# Patient Record
Sex: Female | Born: 1955 | Race: White | Hispanic: No | Marital: Married | Smoking: Former smoker
Health system: Southern US, Community
[De-identification: ages and names within clinical notes are randomized; demographics above are authoritative.]

## PROBLEM LIST (undated history)

## (undated) DIAGNOSIS — K219 Gastro-esophageal reflux disease without esophagitis: Secondary | ICD-10-CM

## (undated) DIAGNOSIS — R0602 Shortness of breath: Secondary | ICD-10-CM

## (undated) DIAGNOSIS — R112 Nausea with vomiting, unspecified: Secondary | ICD-10-CM

## (undated) DIAGNOSIS — E785 Hyperlipidemia, unspecified: Secondary | ICD-10-CM

## (undated) DIAGNOSIS — I350 Nonrheumatic aortic (valve) stenosis: Secondary | ICD-10-CM

## (undated) DIAGNOSIS — R002 Palpitations: Secondary | ICD-10-CM

## (undated) DIAGNOSIS — Z90710 Acquired absence of both cervix and uterus: Secondary | ICD-10-CM

## (undated) DIAGNOSIS — I209 Angina pectoris, unspecified: Secondary | ICD-10-CM

## (undated) DIAGNOSIS — R51 Headache: Secondary | ICD-10-CM

## (undated) DIAGNOSIS — E042 Nontoxic multinodular goiter: Secondary | ICD-10-CM

## (undated) DIAGNOSIS — Z9889 Other specified postprocedural states: Secondary | ICD-10-CM

## (undated) DIAGNOSIS — R011 Cardiac murmur, unspecified: Secondary | ICD-10-CM

## (undated) HISTORY — PX: CARDIAC VALVE REPLACEMENT: SHX585

## (undated) HISTORY — DX: Gastro-esophageal reflux disease without esophagitis: K21.9

## (undated) HISTORY — DX: Palpitations: R00.2

## (undated) HISTORY — DX: Other specified postprocedural states: Z98.890

## (undated) HISTORY — PX: BREAST CYST ASPIRATION: SHX578

## (undated) HISTORY — PX: ABDOMINAL HYSTERECTOMY: SHX81

## (undated) HISTORY — DX: Nonrheumatic aortic (valve) stenosis: I35.0

## (undated) HISTORY — DX: Acquired absence of both cervix and uterus: Z90.710

## (undated) HISTORY — DX: Cardiac murmur, unspecified: R01.1

## (undated) HISTORY — DX: Hyperlipidemia, unspecified: E78.5

## (undated) HISTORY — PX: PARTIAL HYSTERECTOMY: SHX80

## (undated) HISTORY — DX: Nontoxic multinodular goiter: E04.2

## (undated) HISTORY — PX: BUNIONECTOMY: SHX129

## (undated) HISTORY — DX: Other specified postprocedural states: R11.2

---

## 1999-11-20 ENCOUNTER — Other Ambulatory Visit: Admission: RE | Admit: 1999-11-20 | Discharge: 1999-11-20 | Payer: Self-pay | Admitting: *Deleted

## 2000-06-25 ENCOUNTER — Encounter: Payer: Self-pay | Admitting: Gastroenterology

## 2000-06-25 ENCOUNTER — Encounter: Admission: RE | Admit: 2000-06-25 | Discharge: 2000-06-25 | Payer: Self-pay | Admitting: Gastroenterology

## 2000-07-08 ENCOUNTER — Ambulatory Visit (HOSPITAL_COMMUNITY): Admission: RE | Admit: 2000-07-08 | Discharge: 2000-07-08 | Payer: Self-pay | Admitting: Gastroenterology

## 2000-07-08 ENCOUNTER — Encounter: Payer: Self-pay | Admitting: Gastroenterology

## 2000-10-21 ENCOUNTER — Encounter: Admission: RE | Admit: 2000-10-21 | Discharge: 2000-10-21 | Payer: Self-pay | Admitting: Obstetrics and Gynecology

## 2000-10-21 ENCOUNTER — Encounter: Payer: Self-pay | Admitting: Obstetrics and Gynecology

## 2000-11-21 ENCOUNTER — Other Ambulatory Visit: Admission: RE | Admit: 2000-11-21 | Discharge: 2000-11-21 | Payer: Self-pay | Admitting: *Deleted

## 2001-11-23 ENCOUNTER — Other Ambulatory Visit: Admission: RE | Admit: 2001-11-23 | Discharge: 2001-11-23 | Payer: Self-pay | Admitting: Obstetrics and Gynecology

## 2002-12-13 ENCOUNTER — Encounter: Payer: Self-pay | Admitting: Obstetrics and Gynecology

## 2002-12-13 ENCOUNTER — Encounter: Admission: RE | Admit: 2002-12-13 | Discharge: 2002-12-13 | Payer: Self-pay | Admitting: Obstetrics and Gynecology

## 2003-04-05 ENCOUNTER — Encounter: Payer: Self-pay | Admitting: Family Medicine

## 2003-04-05 ENCOUNTER — Encounter: Admission: RE | Admit: 2003-04-05 | Discharge: 2003-04-05 | Payer: Self-pay | Admitting: Family Medicine

## 2003-04-06 ENCOUNTER — Encounter: Payer: Self-pay | Admitting: Family Medicine

## 2003-04-06 ENCOUNTER — Encounter: Admission: RE | Admit: 2003-04-06 | Discharge: 2003-04-06 | Payer: Self-pay | Admitting: Family Medicine

## 2004-04-12 ENCOUNTER — Encounter: Admission: RE | Admit: 2004-04-12 | Discharge: 2004-04-12 | Payer: Self-pay | Admitting: *Deleted

## 2004-09-04 ENCOUNTER — Other Ambulatory Visit: Admission: RE | Admit: 2004-09-04 | Discharge: 2004-09-04 | Payer: Self-pay | Admitting: *Deleted

## 2004-09-10 ENCOUNTER — Encounter: Admission: RE | Admit: 2004-09-10 | Discharge: 2004-09-10 | Payer: Self-pay | Admitting: Family Medicine

## 2005-08-12 HISTORY — PX: BREAST CYST ASPIRATION: SHX578

## 2006-01-28 ENCOUNTER — Encounter: Admission: RE | Admit: 2006-01-28 | Discharge: 2006-01-28 | Payer: Self-pay | Admitting: *Deleted

## 2006-02-10 ENCOUNTER — Encounter: Admission: RE | Admit: 2006-02-10 | Discharge: 2006-02-10 | Payer: Self-pay | Admitting: *Deleted

## 2006-11-13 ENCOUNTER — Ambulatory Visit: Payer: Self-pay | Admitting: Internal Medicine

## 2006-11-13 LAB — CONVERTED CEMR LAB
ALT: 12 units/L (ref 0–40)
Albumin: 3.8 g/dL (ref 3.5–5.2)
Alkaline Phosphatase: 70 units/L (ref 39–117)
Bilirubin, Direct: 0.1 mg/dL (ref 0.0–0.3)
Calcium: 9 mg/dL (ref 8.4–10.5)
Chloride: 106 meq/L (ref 96–112)
Cholesterol: 231 mg/dL (ref 0–200)
Creatinine, Ser: 0.8 mg/dL (ref 0.4–1.2)
Direct LDL: 146.2 mg/dL
Eosinophils Absolute: 0.1 10*3/uL (ref 0.0–0.6)
Eosinophils Relative: 2.4 % (ref 0.0–5.0)
GFR calc Af Amer: 98 mL/min
GFR calc non Af Amer: 81 mL/min
Glucose, Bld: 98 mg/dL (ref 70–99)
HCT: 39.9 % (ref 36.0–46.0)
Ketones, ur: NEGATIVE mg/dL
MCV: 89.4 fL (ref 78.0–100.0)
Neutro Abs: 2.9 10*3/uL (ref 1.4–7.7)
Neutrophils Relative %: 47.2 % (ref 43.0–77.0)
Nitrite: NEGATIVE
Platelets: 276 10*3/uL (ref 150–400)
Potassium: 4.3 meq/L (ref 3.5–5.1)
RBC: 4.46 M/uL (ref 3.87–5.11)
RDW: 12 % (ref 11.5–14.6)
Sodium: 141 meq/L (ref 135–145)
Specific Gravity, Urine: 1.02 (ref 1.000–1.03)
WBC: 6.2 10*3/uL (ref 4.5–10.5)
pH: 6.5 (ref 5.0–8.0)

## 2006-11-20 ENCOUNTER — Ambulatory Visit: Payer: Self-pay | Admitting: Internal Medicine

## 2006-11-25 ENCOUNTER — Encounter: Admission: RE | Admit: 2006-11-25 | Discharge: 2006-11-25 | Payer: Self-pay | Admitting: Internal Medicine

## 2006-12-03 ENCOUNTER — Ambulatory Visit: Payer: Self-pay | Admitting: Cardiovascular Disease

## 2006-12-16 ENCOUNTER — Ambulatory Visit: Payer: Self-pay | Admitting: Cardiovascular Disease

## 2006-12-16 ENCOUNTER — Encounter: Payer: Self-pay | Admitting: Internal Medicine

## 2006-12-16 ENCOUNTER — Ambulatory Visit: Payer: Self-pay

## 2007-01-01 ENCOUNTER — Ambulatory Visit: Payer: Self-pay | Admitting: Internal Medicine

## 2007-02-02 ENCOUNTER — Encounter: Payer: Self-pay | Admitting: Internal Medicine

## 2007-02-02 LAB — CONVERTED CEMR LAB

## 2007-02-16 ENCOUNTER — Ambulatory Visit: Payer: Self-pay | Admitting: Cardiovascular Disease

## 2007-03-04 ENCOUNTER — Encounter: Admission: RE | Admit: 2007-03-04 | Discharge: 2007-03-04 | Payer: Self-pay | Admitting: *Deleted

## 2007-06-03 ENCOUNTER — Ambulatory Visit: Payer: Self-pay | Admitting: Internal Medicine

## 2007-06-08 ENCOUNTER — Telehealth: Payer: Self-pay | Admitting: Internal Medicine

## 2007-06-22 ENCOUNTER — Ambulatory Visit: Payer: Self-pay | Admitting: Internal Medicine

## 2007-06-22 DIAGNOSIS — E785 Hyperlipidemia, unspecified: Secondary | ICD-10-CM | POA: Insufficient documentation

## 2007-06-22 DIAGNOSIS — E042 Nontoxic multinodular goiter: Secondary | ICD-10-CM | POA: Insufficient documentation

## 2007-06-22 DIAGNOSIS — G43009 Migraine without aura, not intractable, without status migrainosus: Secondary | ICD-10-CM | POA: Insufficient documentation

## 2007-06-25 ENCOUNTER — Telehealth (INDEPENDENT_AMBULATORY_CARE_PROVIDER_SITE_OTHER): Payer: Self-pay | Admitting: *Deleted

## 2007-09-09 ENCOUNTER — Encounter: Payer: Self-pay | Admitting: Internal Medicine

## 2007-09-10 ENCOUNTER — Ambulatory Visit: Payer: Self-pay | Admitting: Internal Medicine

## 2007-09-10 DIAGNOSIS — R059 Cough, unspecified: Secondary | ICD-10-CM | POA: Insufficient documentation

## 2007-09-10 DIAGNOSIS — R05 Cough: Secondary | ICD-10-CM

## 2007-09-18 ENCOUNTER — Ambulatory Visit: Payer: Self-pay | Admitting: Internal Medicine

## 2007-09-18 LAB — CONVERTED CEMR LAB
Cholesterol: 165 mg/dL (ref 0–200)
HDL: 38.6 mg/dL — ABNORMAL LOW (ref >39.0)
VLDL: 36 mg/dL (ref 0–40)

## 2007-09-21 ENCOUNTER — Encounter: Payer: Self-pay | Admitting: Internal Medicine

## 2007-11-07 ENCOUNTER — Encounter: Payer: Self-pay | Admitting: Internal Medicine

## 2007-11-07 ENCOUNTER — Ambulatory Visit: Payer: Self-pay | Admitting: Family Medicine

## 2007-11-10 ENCOUNTER — Telehealth: Payer: Self-pay | Admitting: Internal Medicine

## 2007-11-30 ENCOUNTER — Ambulatory Visit: Payer: Self-pay | Admitting: Internal Medicine

## 2007-11-30 DIAGNOSIS — R079 Chest pain, unspecified: Secondary | ICD-10-CM | POA: Insufficient documentation

## 2007-12-02 ENCOUNTER — Encounter (INDEPENDENT_AMBULATORY_CARE_PROVIDER_SITE_OTHER): Payer: Self-pay | Admitting: *Deleted

## 2007-12-16 ENCOUNTER — Encounter: Payer: Self-pay | Admitting: Internal Medicine

## 2007-12-16 ENCOUNTER — Ambulatory Visit: Payer: Self-pay

## 2007-12-28 ENCOUNTER — Telehealth: Payer: Self-pay | Admitting: Internal Medicine

## 2008-02-23 ENCOUNTER — Ambulatory Visit: Payer: Self-pay | Admitting: Internal Medicine

## 2008-02-23 LAB — CONVERTED CEMR LAB
HDL: 43.3 mg/dL (ref >39.0)
Total CHOL/HDL Ratio: 4
Triglycerides: 196 mg/dL — ABNORMAL HIGH (ref 0–149)

## 2008-03-01 ENCOUNTER — Ambulatory Visit: Payer: Self-pay | Admitting: Internal Medicine

## 2008-03-01 DIAGNOSIS — F411 Generalized anxiety disorder: Secondary | ICD-10-CM | POA: Insufficient documentation

## 2008-03-01 DIAGNOSIS — K219 Gastro-esophageal reflux disease without esophagitis: Secondary | ICD-10-CM | POA: Insufficient documentation

## 2008-03-16 ENCOUNTER — Encounter: Admission: RE | Admit: 2008-03-16 | Discharge: 2008-03-16 | Payer: Self-pay | Admitting: *Deleted

## 2008-07-26 ENCOUNTER — Ambulatory Visit: Payer: Self-pay | Admitting: Internal Medicine

## 2009-01-23 ENCOUNTER — Telehealth: Payer: Self-pay | Admitting: Internal Medicine

## 2009-02-10 ENCOUNTER — Ambulatory Visit: Payer: Self-pay | Admitting: Internal Medicine

## 2009-02-10 LAB — CONVERTED CEMR LAB
AST: 21 units/L (ref 0–37)
Albumin: 3.8 g/dL (ref 3.5–5.2)
Alkaline Phosphatase: 76 units/L (ref 39–117)
BUN: 13 mg/dL (ref 6–23)
Bilirubin Urine: NEGATIVE
Calcium: 8.9 mg/dL (ref 8.4–10.5)
Chloride: 104 meq/L (ref 96–112)
Eosinophils Relative: 1.8 % (ref 0.0–5.0)
Glucose, Bld: 87 mg/dL (ref 70–99)
HCT: 39.7 % (ref 36.0–46.0)
HDL: 44.5 mg/dL (ref >39.00)
Hemoglobin, Urine: NEGATIVE
Hemoglobin: 13.6 g/dL (ref 12.0–15.0)
Ketones, ur: NEGATIVE mg/dL
Leukocytes, UA: NEGATIVE
Lymphocytes Relative: 32.4 % (ref 12.0–46.0)
Lymphs Abs: 2.4 10*3/uL (ref 0.7–4.0)
Monocytes Absolute: 0.7 10*3/uL (ref 0.1–1.0)
Neutro Abs: 4 10*3/uL (ref 1.4–7.7)
RDW: 12.6 % (ref 11.5–14.6)
Sodium: 143 meq/L (ref 135–145)
Specific Gravity, Urine: 1.02 (ref 1.000–1.030)
TSH: 1.77 microintl units/mL (ref 0.35–5.50)
Total CHOL/HDL Ratio: 5
Total Protein, Urine: NEGATIVE mg/dL
Total Protein: 7 g/dL (ref 6.0–8.3)
VLDL: 30.8 mg/dL (ref 0.0–40.0)

## 2009-02-16 ENCOUNTER — Ambulatory Visit: Payer: Self-pay | Admitting: Internal Medicine

## 2009-02-21 ENCOUNTER — Telehealth: Payer: Self-pay | Admitting: Internal Medicine

## 2009-03-16 ENCOUNTER — Ambulatory Visit: Payer: Self-pay | Admitting: Internal Medicine

## 2009-03-16 LAB — CONVERTED CEMR LAB
ALT: 12 units/L (ref 0–35)
AST: 21 units/L (ref 0–37)
Bilirubin, Direct: 0.2 mg/dL (ref 0.0–0.3)
Cholesterol: 239 mg/dL — ABNORMAL HIGH (ref 0–200)
Direct LDL: 153.7 mg/dL
Total Bilirubin: 0.7 mg/dL (ref 0.3–1.2)
Total Protein: 7.1 g/dL (ref 6.0–8.3)
Triglycerides: 156 mg/dL — ABNORMAL HIGH (ref 0.0–149.0)
VLDL: 31.2 mg/dL (ref 0.0–40.0)

## 2009-03-22 ENCOUNTER — Telehealth: Payer: Self-pay | Admitting: Internal Medicine

## 2009-04-04 ENCOUNTER — Encounter: Admission: RE | Admit: 2009-04-04 | Discharge: 2009-04-04 | Payer: Self-pay | Admitting: Obstetrics and Gynecology

## 2009-06-07 ENCOUNTER — Ambulatory Visit: Payer: Self-pay | Admitting: Internal Medicine

## 2009-06-07 DIAGNOSIS — M2550 Pain in unspecified joint: Secondary | ICD-10-CM | POA: Insufficient documentation

## 2009-06-22 ENCOUNTER — Encounter: Payer: Self-pay | Admitting: Internal Medicine

## 2009-07-12 ENCOUNTER — Ambulatory Visit: Payer: Self-pay | Admitting: Internal Medicine

## 2009-07-12 LAB — CONVERTED CEMR LAB
AST: 28 units/L (ref 0–37)
Total Protein: 6.8 g/dL (ref 6.0–8.3)
VLDL: 37.4 mg/dL (ref 0.0–40.0)

## 2009-07-19 ENCOUNTER — Ambulatory Visit: Payer: Self-pay | Admitting: Internal Medicine

## 2009-07-19 DIAGNOSIS — M1990 Unspecified osteoarthritis, unspecified site: Secondary | ICD-10-CM | POA: Insufficient documentation

## 2009-08-16 ENCOUNTER — Ambulatory Visit: Payer: Self-pay | Admitting: Internal Medicine

## 2009-08-16 LAB — CONVERTED CEMR LAB
Alkaline Phosphatase: 67 units/L (ref 39–117)
Bilirubin, Direct: 0.1 mg/dL (ref 0.0–0.3)
Cholesterol: 199 mg/dL (ref 0–200)
HDL: 59.2 mg/dL (ref >39.00)
Total CHOL/HDL Ratio: 3
Total Protein: 7.3 g/dL (ref 6.0–8.3)
VLDL: 40.2 mg/dL — ABNORMAL HIGH (ref 0.0–40.0)

## 2009-10-23 ENCOUNTER — Telehealth: Payer: Self-pay | Admitting: Internal Medicine

## 2009-11-22 ENCOUNTER — Ambulatory Visit: Payer: Self-pay | Admitting: Internal Medicine

## 2009-11-22 DIAGNOSIS — R011 Cardiac murmur, unspecified: Secondary | ICD-10-CM | POA: Insufficient documentation

## 2009-11-22 DIAGNOSIS — R5383 Other fatigue: Secondary | ICD-10-CM | POA: Insufficient documentation

## 2009-11-22 DIAGNOSIS — R5381 Other malaise: Secondary | ICD-10-CM | POA: Insufficient documentation

## 2009-11-23 ENCOUNTER — Telehealth (INDEPENDENT_AMBULATORY_CARE_PROVIDER_SITE_OTHER): Payer: Self-pay | Admitting: *Deleted

## 2009-11-23 LAB — CONVERTED CEMR LAB
Alkaline Phosphatase: 79 units/L (ref 39–117)
Bilirubin, Direct: 0 mg/dL (ref 0.0–0.3)
CO2: 31 meq/L (ref 19–32)
Calcium: 9.4 mg/dL (ref 8.4–10.5)
Chloride: 102 meq/L (ref 96–112)
Creatinine, Ser: 0.7 mg/dL (ref 0.4–1.2)
Eosinophils Relative: 1.6 % (ref 0.0–5.0)
Folate: 14.4 ng/mL
Glucose, Bld: 92 mg/dL (ref 70–99)
Hemoglobin: 13.8 g/dL (ref 12.0–15.0)
Lymphs Abs: 3.1 10*3/uL (ref 0.7–4.0)
MCHC: 34 g/dL (ref 30.0–36.0)
MCV: 89.8 fL (ref 78.0–100.0)
Monocytes Absolute: 0.8 10*3/uL (ref 0.1–1.0)
Platelets: 288 10*3/uL (ref 150.0–400.0)
Potassium: 4.1 meq/L (ref 3.5–5.1)
RBC: 4.5 M/uL (ref 3.87–5.11)
Sodium: 141 meq/L (ref 135–145)
TSH: 1.47 microintl units/mL (ref 0.35–5.50)
Total Protein: 7.6 g/dL (ref 6.0–8.3)
WBC: 8.9 10*3/uL (ref 4.5–10.5)

## 2010-04-05 ENCOUNTER — Encounter: Admission: RE | Admit: 2010-04-05 | Discharge: 2010-04-05 | Payer: Self-pay | Admitting: Obstetrics and Gynecology

## 2010-04-09 ENCOUNTER — Telehealth: Payer: Self-pay | Admitting: Cardiovascular Disease

## 2010-04-18 ENCOUNTER — Ambulatory Visit (HOSPITAL_COMMUNITY)
Admission: RE | Admit: 2010-04-18 | Discharge: 2010-04-18 | Payer: Self-pay | Source: Home / Self Care | Admitting: Cardiovascular Disease

## 2010-04-18 ENCOUNTER — Encounter: Payer: Self-pay | Admitting: Cardiovascular Disease

## 2010-04-18 ENCOUNTER — Ambulatory Visit: Payer: Self-pay | Admitting: Cardiovascular Disease

## 2010-04-18 ENCOUNTER — Ambulatory Visit: Payer: Self-pay

## 2010-04-23 ENCOUNTER — Encounter: Payer: Self-pay | Admitting: Cardiovascular Disease

## 2010-05-07 ENCOUNTER — Ambulatory Visit: Payer: Self-pay | Admitting: Cardiovascular Disease

## 2010-05-07 DIAGNOSIS — I359 Nonrheumatic aortic valve disorder, unspecified: Secondary | ICD-10-CM | POA: Insufficient documentation

## 2010-05-11 ENCOUNTER — Ambulatory Visit (HOSPITAL_COMMUNITY): Admission: RE | Admit: 2010-05-11 | Discharge: 2010-05-11 | Payer: Self-pay | Admitting: Internal Medicine

## 2010-05-11 ENCOUNTER — Ambulatory Visit: Payer: Self-pay | Admitting: Internal Medicine

## 2010-05-11 ENCOUNTER — Encounter: Payer: Self-pay | Admitting: Internal Medicine

## 2010-05-17 ENCOUNTER — Ambulatory Visit: Payer: Self-pay | Admitting: Cardiovascular Disease

## 2010-06-04 ENCOUNTER — Telehealth: Payer: Self-pay | Admitting: Internal Medicine

## 2010-08-29 ENCOUNTER — Ambulatory Visit
Admission: RE | Admit: 2010-08-29 | Discharge: 2010-08-29 | Payer: Self-pay | Source: Home / Self Care | Attending: Internal Medicine | Admitting: Internal Medicine

## 2010-08-29 DIAGNOSIS — J019 Acute sinusitis, unspecified: Secondary | ICD-10-CM | POA: Insufficient documentation

## 2010-09-01 ENCOUNTER — Encounter: Payer: Self-pay | Admitting: *Deleted

## 2010-09-11 NOTE — Progress Notes (Signed)
Summary: Tick bite  Phone Note Call from Patient Call back at Work Phone [phone]   Caller: Patient Summary of Call: pt called stating that she had a tick in her head. Husband was able to remove lower half of tick. pt did got to UC were she was Rx'd Doxy but  the upper half of the tick was not removed and pt is concerned that with the head of the tick still attached she may get sick even with the ABX. Pt said that there was a bullseye type ring around sore which has improved slightly but she still has HA and soreness at the site. please advise. Initial call taken by: Margaret Pyle, CMA,  October 23, 2009 10:44 AM  Follow-up for Phone Call        OK to cont for now,  there can be swelling and hardness at the site that make it seem like head is still there;  ok to cont the doxy as prescribed as this is the best treatment Follow-up by: Corwin Levins MD,  October 23, 2009 12:53 PM  Additional Follow-up for Phone Call Additional follow up Details #1::        pt imformed and will call back if new sys develop Additional Follow-up by: Margaret Pyle, CMA,  October 23, 2009 1:44 PM

## 2010-09-11 NOTE — Letter (Signed)
Summary: TEE Instructions  [facility], Main Office  [address]   [place], [place] [address]   Phone: [phone]  Fax: [phone]      TEE Instructions  05/07/2010 MRN: [id number]  [name] [address] [place], [place]  [address]      You are scheduled for a TEE on  Friday May 11, 2010 with Dr. Tenny Craw.  Please arrive at the [facility] at 12:00 Noon on the day of your procedure.  1)   Diet:     May have clear liquid breakfast. Then nothing to drink after 7:00 am.  Clear      liquids include:  water, broth, Sprite, Ginger Ale, black cofee, tea (no sugar),      cranberry / grape / apple juice, jello (not red), popsicle from clear juices (not      red).  2)  Must have a responsible person to drive you home.  3)   Bring your current insurance cards and current list of all your medications.   *Special Note:  Every effort is made to have your procedure done on time.  Occasionally there are emergencies that present themselves at the hospital that may cause delays.  Please be patient if a delay does occur.  *If you have any questions after you get home, please call the office at [phone].

## 2010-09-11 NOTE — Progress Notes (Signed)
----   Converted from flag ---- ---- 11/23/2009 11:32 AM, Edman Circle wrote: appt 5/3 @ 1:00  ---- 11/23/2009 9:41 AM, Dagoberto Reef wrote: Asher Muir, pt need echo.   Thanks ------------------------------

## 2010-09-11 NOTE — Assessment & Plan Note (Signed)
Summary: FATIGUE/ NOT FEELING WELL/NWS   Vital Signs:  Patient profile:   55 year old female Height:      62 inches Weight:      171 pounds BMI:     31.39 O2 Sat:      96 % on Room air Temp:     98.5 degrees F oral Pulse rate:   88 / minute BP sitting:   104 / 70  (left arm) Cuff size:   regular  Vitals Entered ByZella Ball Ewing (November 22, 2009 3:33 PM)  O2 Flow:  Room air CC: fatigue, headaches/RE   Primary Care Provider:  yoo  CC:  fatigue and headaches/RE.  History of Present Illness: here with c/o tick bite to the left occipital area mar 15 with mild to mod redness, tender and swelling that has since improved,  still a small bump in the area and is convinced the head is still under the skin;  here today due to very unusual marked fatigue for one wk with headache, ? fever, non-restorative sleep and daytime hypersomnia.  Has lost 3 lbs since last visit, does snore at night, but no report of change in breathing per husband.  Pt denies other new neuro symptoms such as other headache, facial or extremity weakness .  No other rash, joint pains, joint swelling and Pt denies CP, sob, doe, wheezing, orthopnea, pnd, worsening LE edema, palps, dizziness or syncope   No unusual wt loss, other fever, rash , abd pain, bowel or bladder changes, or other constitutional symptoms.    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 2po Qd For 1 Day, Then 1po Qd For 4days, Then Stop 2)  Crestor 20 Mg Tabs (Rosuvastatin Calcium) .Marland Kitchen.. 1 By Mouth Once Daily  Current Medications (verified): 1)  Doxycycline Hyclate 100 Mg Caps (Doxycycline Hyclate) .Marland Kitchen.. 1 By Mouth Two Times A Day 2)  Crestor 20 Mg Tabs (Rosuvastatin Calcium) .Marland Kitchen.. 1 By Mouth Once Daily  Allergies (verified): 1)  ! Codeine 2)  Lipitor (Atorvastatin) 3)  * Niaspan  Past History:  Past Medical History: Last updated: 07/19/2009 Hyperlipidemia multinodular thyroid migraine Aortic sclerosis GERD Anxiety generalized DJD  Past Surgical History: Last updated: 06/22/2007 Hysterectomy  Social History: Last updated: 03/01/2008 Former Smoker Alcohol use-no Married - one daughter 62 y/o Occupation: Employed as Production manager for [employer]  Risk Factors: Smoking Status: quit (06/22/2007)  Review of Systems       all otherwise negative per pt -    Physical Exam  General:  alert and overweight-appearing.   Head:  normocephalic and atraumatic.   Eyes:  vision grossly intact, pupils equal, and pupils round.   Ears:  R ear normal and L ear normal.   Nose:  no external deformity and no nasal discharge.   Mouth:  no gingival abnormalities and  pharynx pink and moist.   Neck:  supple and no masses.   Lungs:  normal respiratory effort and normal breath sounds.   Heart:  RRR, with gr 1-2/6 s murmur at the RUSB Abdomen:  soft, non-tender, and normal bowel sounds.   Msk:  no joint tenderness and no joint swelling.   Extremities:  no edema, no erythema  Neurologic:  cranial nerves II-XII intact and strength normal in all extremities.   Skin:  no rash or sweling to the left occipital area Psych:  normally interactive and moderately anxious.     Impression & Recommendations:  Problem # 1:  BITE OF NONVENOMOUS ARTHROPOD (ICD-E906.4) to check lyme dz titer per pt request;   tx with doxy course, f/u any worsening s/s Orders: T-Lyme Disease ([address])  Problem # 2:  FATIGUE (ICD-780.79) exam benign, to check labs below; follow with expectant management  Orders: TLB-BMP (Basic Metabolic Panel-BMET) (80048-METABOL) TLB-Hepatic/Liver Function Pnl (80076-HEPATIC) TLB-CBC Platelet - w/Differential (85025-CBCD) TLB-TSH (Thyroid Stimulating Hormone) (84443-TSH) TLB-B12 + Folate Pnl (09811_91478-G95/AOZ) TLB-Sedimentation Rate (ESR) (85652-ESR)  Problem # 3:  CARDIAC MURMUR (ICD-785.2) seems likely incidental finding - to check echo to r/o cardiac problem with her unusual fatigue, declines ecg/cxr but will consider further eval if symptoms persist ro worsen, to consider card referral Orders: Echo Referral (Echo)  Complete Medication List: 1)  Doxycycline Hyclate 100 Mg Caps (Doxycycline hyclate) .Marland Kitchen.. 1 by mouth two times a day 2)  Crestor 20 Mg Tabs (Rosuvastatin calcium) .Marland Kitchen.. 1 by mouth once daily  Patient Instructions: 1)  Please take all new medications as prescribed 2)  Continue all previous medications as before this visit  3)  Please go to the Lab in the basement for your blood and/or urine tests today 4)  You will be contacted about the referral(s) to: echocardiogram for the heart 5)  Please schedule a follow-up appointment in 6 months with CPX labs Prescriptions: DOXYCYCLINE HYCLATE 100 MG CAPS (DOXYCYCLINE HYCLATE) 1 by mouth two times a day  #20 x 0   Entered and Authorized by:   Corwin Levins MD   Signed by:   Corwin Levins MD on 11/22/2009   Method used:   Print then Give to Patient   RxID:   [id number]

## 2010-09-11 NOTE — Progress Notes (Signed)
  Phone Note Refill Request Message from:  Fax from Pharmacy on June 04, 2010 11:57 AM  Refills Requested: Medication #1:  CRESTOR 20 MG TABS 1 by mouth once daily   Dosage confirmed as above?Dosage Confirmed   Last Refilled: 07/19/2009   Notes: [facility] Initial call taken by: Robin Ewing CMA Duncan Dull),  June 04, 2010 11:57 AM    Prescriptions: CRESTOR 20 MG TABS (ROSUVASTATIN CALCIUM) 1 by mouth once daily  #90 x 2   Entered by:   Scharlene Gloss CMA (AAMA)   Authorized by:   Corwin Levins MD   Signed by:   Scharlene Gloss CMA (AAMA) on 06/04/2010   Method used:   Faxed to ...       [facility] education [facility])       [address] [address]       [place], [place]  [address]       Ph: [phone]       Fax: [phone]   RxID:   [id number]

## 2010-09-11 NOTE — Assessment & Plan Note (Signed)
Summary: f/u echo   Visit Type:  follow-up last seen 2008 Primary Provider:  Artist Pais  CC:  Some chest pressure- Dizziness after treadmill .  History of Present Illness: 55 year-old woman presenting for followup evaluation after recent evaluation for a heart murmur. She was seen here in 2008 and thought to have aortic sclerosis at that time. Her murmur has increased in intensity and she was referred for a repeat echo demonstrating moderate aortic stenosis. The aortic valve was not well-seen on the 2D echo study.  The patient complains of lightheadedness after she exercises, but denies symptoms concurrent with exertion. She denies exertional chest pain or lightheadedness. She does have chronic dyspnea, unchanged over recent months. She also complains of palpitations. Denies edema.  Current Medications (verified): 1)  Crestor 20 Mg Tabs (Rosuvastatin Calcium) .Marland Kitchen.. 1 By Mouth Once Daily 2)  Fish Oil Triple Strength 1400 Mg Caps (Omega-3 Fatty Acids) .... 900mg  Daily 3)  Vitamin B-12 1000 Mcg Tabs (Cyanocobalamin) .... Take 1 Tablet By Mouth Once A Day 4)  Policosanol 10 Mg Caps (Policosanol) .... Take 1 Capsule By Mouth Once A Day 5)  Evening Primrose Oil 500 Mg Caps (Evening Primrose Oil) .... Take 1 Capsule By Mouth Once A Day 6)  Meno Herbs W/red Clover Natural Plant Estrogens .... Once Daily  Allergies: 1)  ! Codeine 2)  Lipitor (Atorvastatin) 3)  * Niaspan  Past History:  Past medical history reviewed for relevance to current acute and chronic problems.  Past Medical History: Reviewed history from 05/01/2010 and no changes required. Palpitations Hyperlipidemia Heart murmur multinodular thyroid migraine Aortic sclerosis GERD Anxiety generalized DJD  Review of Systems       Negative except as per HPI  Vital Signs:  Patient profile:   55 year old female Height:      62 inches Weight:      180.75 pounds BMI:     33.18 Pulse rate:   71 / minute Pulse rhythm:    regular Resp:     18 per minute BP sitting:   124 / 84  (left arm) Cuff size:   large  Vitals Entered By: Vikki Ports (May 07, 2010 11:53 AM)  Physical Exam  General:  Pt is alert and oriented, in no acute distress. HEENT: normal Neck: normal carotid upstrokes without bruits, JVP normal Lungs: CTA CV: RRR with 3/6 systolic murmur at the RUSB, preserved A2 Abd: soft, NT, positive BS, no bruit, no organomegaly Ext: no clubbing, cyanosis, or edema. peripheral pulses 2+ and equal Skin: warm and dry without rash    EKG  Procedure date:  04/18/2010  Findings:      Study Conclusions            - Left ventricle: The cavity size was normal. Wall thickness was       normal. Systolic function was normal. The estimated ejection       fraction was in the range of 55% to 60%.     - Aortic valve: Morphology not well seen. Moderately calcified.       There was moderate stenosis. Valve area: 0.89cm 2(VTI). Valve       area: 0.79cm 2 (Vmax).     - Atrial septum: No defect or patent foramen ovale was identified.  Impression & Recommendations:  Problem # 1:  AORTIC STENOSIS (ICD-424.1) The patient has developed increasing transaortic velocities, now consistent with at least moderate AS. Her exam is also consistent with moderate AS. I reviewed her 2D  echo study and her valve is not well-visualized, but I suspect she has a bicuspid aortic valve considering the significance of her AS at age 69. I have recommended a TEE study to better define her aortic valve pathology and to confirm bicuspid AS versus a subaortic membrane. I reviewed the procedural risks and indication and she agrees to proceed. This will be scheduled with Dr Tenny Craw later this week.  She will return for followup after the TEE study to discuss.  Pending the findings, we discussed the need for SBE prophylaxis before dental procedures.  Orders: EKG w/ Interpretation (93000) Trans Esophageal Echocardiogram (TEE)  Patient  Instructions: 1)  Your physician has requested that you have a TEE.  During a TEE, sound waves are used to create images of your heart. It provides your doctor with information about the size and shape of your heart and how well your heart's chambers and valves are working. In this test, a transducer is attached to the end of a flexible tube that's guided down your throat and into your esophagus (the tube leading from your mouth to your stomach) to get a more detailed image of your heart. You are not awake for the procedure. Please see the instruction sheet given to you today.  For further information please visit [email or web address] 2)  Your physician has recommended you make the following change in your medication: Amoxicillin 500mg  take 4 by mouth 1 hour prior to procedures (SBE card given to pt) 3)  Your physician recommends that you schedule a follow-up appointment in: 2-3 WEEKS Prescriptions: AMOXICILLIN 500 MG CAPS (AMOXICILLIN) take 4 capsules one hour prior to procedure  #12 x 2   Entered by:   Julieta Gutting, RN, BSN   Authorized by:   Norva Karvonen, MD   Signed by:   Julieta Gutting, RN, BSN on 05/07/2010   Method used:   Electronically to        [facility]. [phone]* (retail)       [address]       [place], [place]  [address]       Ph: [phone]       Fax: [phone]   RxID:   [id number]

## 2010-09-11 NOTE — Assessment & Plan Note (Signed)
Summary: f2w   Visit Type:  Follow-up Primary Provider:  yoo  CC:  follow up TEE.  History of Present Illness: 55 year-old woman presenting for followup evaluation after TEE. An increased murmur was noted at her annual exam, and 2D echo raised the question of a bicuspid aortic valve. TEE confirmed this and demonstrated moderate AS with a valve area of 1.2 square cm. The patient reports no symptomatic change since her recent visit here.  Current Medications (verified): 1)  Crestor 20 Mg Tabs (Rosuvastatin Calcium) .Marland Kitchen.. 1 By Mouth Once Daily 2)  Fish Oil Triple Strength 1400 Mg Caps (Omega-3 Fatty Acids) .... 900mg  Daily 3)  Vitamin B-12 1000 Mcg Tabs (Cyanocobalamin) .... Take 1 Tablet By Mouth Once A Day 4)  Policosanol 10 Mg Caps (Policosanol) .... Take 1 Capsule By Mouth Once A Day 5)  Evening Primrose Oil 500 Mg Caps (Evening Primrose Oil) .... Take 1 Capsule By Mouth Once A Day 6)  Meno Herbs W/red Clover Natural Plant Estrogens .... Once Daily 7)  Amoxicillin 500 Mg Caps (Amoxicillin) .... Take 4 Capsules One Hour Prior To Procedure  Allergies: 1)  ! Codeine 2)  Lipitor (Atorvastatin) 3)  * Niaspan  Past History:  Past medical history reviewed for relevance to current acute and chronic problems.  Past Medical History: Palpitations Hyperlipidemia Bicuspid Ao Valve with moderate aortic stenosis multinodular thyroid migraine GERD Anxiety generalized DJD  Vital Signs:  Patient profile:   55 year old female Height:      62 inches Weight:      181.75 pounds BMI:     33.36 Pulse rate:   82 / minute Pulse rhythm:   regular Resp:     18 per minute BP sitting:   124 / 76  (left arm) Cuff size:   large  Vitals Entered By: Vikki Ports (May 17, 2010 4:06 PM)   TE Echocardiogram  Procedure date:  05/11/2010  Findings:      Left ventricle: Narrow LVOT (14 mm) Normal LV systolic function. There was mild focal basal hypertrophy of the  septum.  -------------------------------------------------------------------- Aortic valve: AV is thickened, dysplastic. It appears bicuspid. There is mild to moderately restricted motion. By planimetry AVA is 1.2 cm2. Doppler: No regurgitation.  -------------------------------------------------------------------- Aorta: Minimal fixed plaque in thoracic aorta.  -------------------------------------------------------------------- Mitral valve: Mildly thickned MV. Trace MR.  -------------------------------------------------------------------- Left atrium: NO masses in LA.  -------------------------------------------------------------------- Atrial septum: INteratrial septum bulges into RA consistent with increased LA pressure. Tiny PFO is present by color doppler. Ony one to two bubbles appear late in LA after contrast injection.  -------------------------------------------------------------------- Right ventricle: Normal RV systolic function.  -------------------------------------------------------------------- Tricuspid valve: Normal TV.  -------------------------------------------------------------------- Post procedure conclusions Ascending Aorta:  - Minimal fixed plaque in thoracic aorta.   Impression & Recommendations:  Problem # 1:  AORTIC STENOSIS (ICD-424.1) Reviewed TEE findings and implications in detail with the patient and her husband. Pt has moderate AS in the presence of a bicuspid valve. She will require SBE prophylaxis and observation. We discussed expected slow progression of bicuspid AS and the need for regular followup, initially at 6 month intervals.   Patient Instructions: 1)  Your physician recommends that you continue on your current medications as directed. Please refer to the Current Medication list given to you today. 2)  Your physician wants you to follow-up in: 6 MONTHS.   You will receive a reminder letter in the mail two months in advance. If you  don't receive a letter, please  call our office to schedule the follow-up appointment.

## 2010-09-11 NOTE — Progress Notes (Signed)
Summary: Appts  Phone Note Call from Patient Call back at Work Phone [phone] Call back at [phone]   Caller: Patient Summary of Call: Pt request call Initial call taken by: Judie Grieve,  April 09, 2010 8:09 AM  Follow-up for Phone Call        I spoke with the pt and she saw her GYN for her yearly check-up and the GYN was concerned because her heart murmur was more noticeable.  The pt would like to make an appt to see Dr Excell Seltzer. The pt was last evaluated in July 2008 for heart murmur.  I scheduled the pt for an ECHO on 04/18/10 at 4:00 and Dr Excell Seltzer appt on 05/07/10 at 11:30.     Follow-up by: Julieta Gutting, RN, BSN,  April 09, 2010 9:36 AM

## 2010-09-13 NOTE — Assessment & Plan Note (Signed)
Summary: ?earache/[name]/cd   Vital Signs:  Patient profile:   55 year old female Menstrual status:  hysterectomy Height:      62 inches Weight:      183 pounds BMI:     33.59 O2 Sat:      95 % on Room air Temp:     98.5 degrees F oral Pulse rate:   93 / minute Pulse rhythm:   regular Resp:     16 per minute BP sitting:   128 / 82  (left arm) Cuff size:   regular  Vitals Entered By: Rock Nephew CMA (August 29, 2010 2:23 PM)  Nutrition Counseling: Patient's BMI is greater than 25 and therefore counseled on weight management options.  O2 Flow:  Room air CC: Patient c/o R ear pain and pressure with headache, URI symptoms  Does patient need assistance? Functional Status Self care Ambulation Normal     Menstrual Status hysterectomy Last PAP Result normal   Primary Care Provider:  Corwin Levins MD  CC:  Patient c/o R ear pain and pressure with headache and URI symptoms.  History of Present Illness:  URI Symptoms      This is a 55 year old woman who presents with URI symptoms.  The symptoms began 3 weeks ago.  The severity is described as mild.  The patient reports earache, but denies nasal congestion, clear nasal discharge, purulent nasal discharge, sore throat, dry cough, productive cough, and sick contacts.  The patient denies fever, stiff neck, dyspnea, wheezing, rash, vomiting, diarrhea, use of an antipyretic, and response to antipyretic.  The patient denies itchy watery eyes, itchy throat, sneezing, seasonal symptoms, response to antihistamine, headache, muscle aches, and severe fatigue.  Risk factors for Strep sinusitis include unilateral facial pain, unilateral nasal discharge, and poor response to decongestant.  The patient denies the following risk factors for Strep sinusitis: double sickening, tooth pain, Strep exposure, tender adenopathy, and absence of cough.    Preventive Screening-Counseling & Management  Alcohol-Tobacco     Alcohol drinks/day: 0     Alcohol  Counseling: not indicated; patient does not drink     Smoking Status: current     Tobacco Counseling: not indicated; no tobacco use  Hep-HIV-STD-Contraception     Hepatitis Risk: no risk noted     HIV Risk: no risk noted     STD Risk: no risk noted      Sexual History:  currently monogamous.        Drug Use:  never.        Blood Transfusions:  no.    Clinical Review Panels:  Prevention   Last Mammogram:  normal (03/14/2008)   Last Pap Smear:  normal (03/10/2008)  Immunizations   Last Tetanus Booster:  Td (10/29/2004)   Last Flu Vaccine:  Historical (05/08/2009)  Lipid Management   Cholesterol:  199 (08/16/2009)   LDL (bad choesterol):  93 (02/23/2008)   HDL (good cholesterol):  59.20 (08/16/2009)  Diabetes Management   Creatinine:  0.7 (11/22/2009)   Last Flu Vaccine:  Historical (05/08/2009)  CBC   WBC:  8.9 (11/22/2009)   RBC:  4.50 (11/22/2009)   Hgb:  13.8 (11/22/2009)   Hct:  40.4 (11/22/2009)   Platelets:  288.0 (11/22/2009)   MCV  89.8 (11/22/2009)   MCHC  34.0 (11/22/2009)   RDW  12.9 (11/22/2009)   PMN:  54.5 (11/22/2009)   Lymphs:  34.2 (11/22/2009)   Monos:  9.1 (11/22/2009)   Eosinophils:  1.6 (  11/22/2009)   Basophil:  0.6 (11/22/2009)  Complete Metabolic Panel   Glucose:  92 (11/22/2009)   Sodium:  141 (11/22/2009)   Potassium:  4.1 (11/22/2009)   Chloride:  102 (11/22/2009)   CO2:  31 (11/22/2009)   BUN:  16 (11/22/2009)   Creatinine:  0.7 (11/22/2009)   Albumin:  4.0 (11/22/2009)   Total Protein:  7.6 (11/22/2009)   Calcium:  9.4 (11/22/2009)   Total Bili:  0.3 (11/22/2009)   Alk Phos:  79 (11/22/2009)   SGPT (ALT):  17 (11/22/2009)   SGOT (AST):  23 (11/22/2009)   Medications Prior to Update: 1)  Crestor 20 Mg Tabs (Rosuvastatin Calcium) .Marland Kitchen.. 1 By Mouth Once Daily 2)  Fish Oil Triple Strength 1400 Mg Caps (Omega-3 Fatty Acids) .... 900mg  Daily 3)  Vitamin B-12 1000 Mcg Tabs (Cyanocobalamin) .... Take 1 Tablet By Mouth Once A Day 4)   Policosanol 10 Mg Caps (Policosanol) .... Take 1 Capsule By Mouth Once A Day 5)  Evening Primrose Oil 500 Mg Caps (Evening Primrose Oil) .... Take 1 Capsule By Mouth Once A Day 6)  Meno Herbs W/red Clover Natural Plant Estrogens .... Once Daily 7)  Amoxicillin 500 Mg Caps (Amoxicillin) .... Take 4 Capsules One Hour Prior To Procedure  Current Medications (verified): 1)  Crestor 20 Mg Tabs (Rosuvastatin Calcium) .Marland Kitchen.. 1 By Mouth Once Daily 2)  Fish Oil Triple Strength 1400 Mg Caps (Omega-3 Fatty Acids) .... 900mg  Daily 3)  Vitamin B-12 1000 Mcg Tabs (Cyanocobalamin) .... Take 1 Tablet By Mouth Once A Day 4)  Policosanol 10 Mg Caps (Policosanol) .... Take 1 Capsule By Mouth Once A Day 5)  Evening Primrose Oil 500 Mg Caps (Evening Primrose Oil) .... Take 1 Capsule By Mouth Once A Day 6)  Meno Herbs W/red Clover Natural Plant Estrogens .... Once Daily 7)  Cefuroxime Axetil 500 Mg Tabs (Cefuroxime Axetil) .... One By Mouth Two Times A Day For 10 Days  Allergies (verified): 1)  ! Codeine 2)  Lipitor (Atorvastatin) 3)  * Niaspan  Past History:  Past Medical History: Last updated: 05/17/2010 Palpitations Hyperlipidemia Bicuspid Ao Valve with moderate aortic stenosis multinodular thyroid migraine GERD Anxiety generalized DJD  Past Surgical History: Last updated: 05/01/2010 Hysterectomy  Family History: Last updated: 05/01/2010 Family History Hypertension Family History of CAD Female 1st degree relative - father Family History Diabetes 1st degree relative.  Mother deceased at age 36 secondary to cholangiocarcinoma.  She was also diabetic.  Father age 67 with COPD and coronary artery disease.  Social History: Last updated: 05/01/2010 Former Smoker Alcohol use-no Married - one daughter 40 y/o Occupation: Employed as Production manager for [employer]  Risk Factors: Alcohol Use: 0 (08/29/2010)  Risk Factors: Smoking Status: current (08/29/2010)  Family History: Reviewed history from 05/01/2010  and no changes required. Family History Hypertension Family History of CAD Female 1st degree relative - father Family History Diabetes 1st degree relative.  Mother deceased at age 76 secondary to cholangiocarcinoma.  She was also diabetic.  Father age 8 with COPD and coronary artery disease.  Social History: Reviewed history from 05/01/2010 and no changes required. Former Smoker Alcohol use-no Married - one daughter 27 y/o Occupation: Employed as Production manager for [employer] Smoking Status:  current Hepatitis Risk:  no risk noted HIV Risk:  no risk noted STD Risk:  no risk noted Sexual History:  currently monogamous Drug Use:  never Blood Transfusions:  no  Review of Systems  The patient denies anorexia, fever, weight loss, weight gain,  chest pain, dyspnea on exertion, peripheral edema, prolonged cough, headaches, hemoptysis, abdominal pain, suspicious skin lesions, enlarged lymph nodes, and angioedema.   ENT:  Complains of earache and sinus pressure; denies decreased hearing, difficulty swallowing, ear discharge, hoarseness, nasal congestion, postnasal drainage, ringing in ears, and sore throat.  Physical Exam  General:  alert, well-developed, well-nourished, well-hydrated, appropriate dress, normal appearance, healthy-appearing, cooperative to examination, and good hygiene.   Head:  normocephalic, atraumatic, no abnormalities observed, and no abnormalities palpated.   Eyes:  vision grossly intact, pupils equal, pupils round, and pupils reactive to light.   Ears:  R ear normal and L ear normal.   Nose:  no external deformity, no airflow obstruction, no intranasal foreign body, no nasal polyps, no nasal mucosal lesions, no mucosal friability, no active bleeding or clots, no sinus percussion tenderness, no septum abnormalities, nasal dischargemucosal pallor, and mucosal edema.   Mouth:  good dentition, no gingival abnormalities, no dental plaque, pharynx pink and moist, no erythema, and no exudates.    Neck:  supple, full ROM, no masses, no thyromegaly, no JVD, normal carotid upstroke, no carotid bruits, no cervical lymphadenopathy, and no neck tenderness.   Lungs:  normal respiratory effort, no intercostal retractions, no accessory muscle use, normal breath sounds, no dullness, no fremitus, no crackles, and no wheezes.   Heart:  normal rate, regular rhythm, no gallop, no rub, no JVD, and Grade   1/6 systolic ejection  decrescendo murmur.   Abdomen:  soft, non-tender, normal bowel sounds, no distention, no masses, no guarding, no rigidity, no rebound tenderness, no abdominal hernia, no inguinal hernia, no hepatomegaly, and no splenomegaly.   Msk:  No deformity or scoliosis noted of thoracic or lumbar spine.   Pulses:  R and L carotid,radial,femoral,dorsalis pedis and posterior tibial pulses are full and equal bilaterally Extremities:  No clubbing, cyanosis, edema, or deformity noted with normal full range of motion of all joints.   Neurologic:  No cranial nerve deficits noted. Station and gait are normal. Plantar reflexes are down-going bilaterally. DTRs are symmetrical throughout. Sensory, motor and coordinative functions appear intact. Skin:  Intact without suspicious lesions or rashes Cervical Nodes:  no anterior cervical adenopathy and no posterior cervical adenopathy.   Axillary Nodes:  no R axillary adenopathy and no L axillary adenopathy.   Psych:  Cognition and judgment appear intact. Alert and cooperative with normal attention span and concentration. No apparent delusions, illusions, hallucinations   Impression & Recommendations:  Problem # 1:  SINUSITIS- ACUTE-NOS (ICD-461.9) Assessment New  The following medications were removed from the medication list:    Amoxicillin 500 Mg Caps (Amoxicillin) .Marland Kitchen... Take 4 capsules one hour prior to procedure Her updated medication list for this problem includes:    Cefuroxime Axetil 500 Mg Tabs (Cefuroxime axetil) ..... One by mouth two times a  day for 10 days  Complete Medication List: 1)  Crestor 20 Mg Tabs (Rosuvastatin calcium) .Marland Kitchen.. 1 by mouth once daily 2)  Fish Oil Triple Strength 1400 Mg Caps (Omega-3 fatty acids) .... 900mg  daily 3)  Vitamin B-12 1000 Mcg Tabs (Cyanocobalamin) .... Take 1 tablet by mouth once a day 4)  Policosanol 10 Mg Caps (Policosanol) .... Take 1 capsule by mouth once a day 5)  Evening Primrose Oil 500 Mg Caps (Evening primrose oil) .... Take 1 capsule by mouth once a day 6)  Meno Herbs W/red Clover Natural Plant Estrogens  .... Once daily 7)  Cefuroxime Axetil 500 Mg Tabs (Cefuroxime axetil) .... One  by mouth two times a day for 10 days  Patient Instructions: 1)  Please schedule a follow-up appointment in 1 month. 2)  Take your antibiotic as prescribed until ALL of it is gone, but stop if you develop a rash or swelling and contact our office as soon as possible. 3)  Acute sinusitis symptoms for less than 10 days are not helped by antibiotics.Use warm moist compresses, and over the counter decongestants ( only as directed). Call if no improvement in 5-7 days, sooner if increasing pain, fever, or new symptoms. Prescriptions: CEFUROXIME AXETIL 500 MG TABS (CEFUROXIME AXETIL) One by mouth two times a day for 10 days  #20 x 1   Entered and Authorized by:   Etta Grandchild MD   Signed by:   Etta Grandchild MD on 08/29/2010   Method used:   Electronically to        [facility]. [phone]* (retail)       [address]       [place], [place]  [address]       Ph: [phone]       Fax: [phone]   RxID:   [id number]    Orders Added: 1)  Est. Patient Level IV [44010]

## 2010-10-04 ENCOUNTER — Telehealth: Payer: Self-pay | Admitting: Internal Medicine

## 2010-10-09 NOTE — Progress Notes (Signed)
Summary: Referral  Phone Note Call from Patient Call back at Home Phone [phone] Call back at Work Phone [phone]   Caller: Patient Summary of Call: Pt called requesting referral to have Colonoscopy done Initial call taken by: Margaret Pyle, CMA,  October 04, 2010 9:22 AM  Follow-up for Phone Call        ok - will do Follow-up by: Corwin Levins MD,  October 04, 2010 12:39 PM  Additional Follow-up for Phone Call Additional follow up Details #1::        Pt informed Additional Follow-up by: Margaret Pyle, CMA,  October 04, 2010 1:24 PM

## 2010-10-10 ENCOUNTER — Encounter (INDEPENDENT_AMBULATORY_CARE_PROVIDER_SITE_OTHER): Payer: Self-pay | Admitting: *Deleted

## 2010-10-18 NOTE — Letter (Signed)
Summary: Pre Visit Letter Revised  [facility]  [address] [place], [place] [address]   Phone: [phone]  Fax: [phone]        10/10/2010 MRN: [id number] [name] [address] [place], [place]  [address]             Procedure Date:  11/12/2010 @ 8:30   Direct colon-Dr. Christella Hartigan   Welcome to the Gastroenterology Division at [facility].    You are scheduled to see a nurse for your pre-procedure visit on 10/29/2010 at 4:30 on the 3rd floor at [facility], [address]  We ask that you try to arrive at our office 15 minutes prior to your appointment time to allow for check-in.  Please take a minute to review the attached form.  If you answer "Yes" to one or more of the questions on the first page, we ask that you call the person listed at your earliest opportunity.  If you answer "No" to all of the questions, please complete the rest of the form and bring it to your appointment.    Your nurse visit will consist of discussing your medical and surgical history, your immediate family medical history, and your medications.   If you are unable to list all of your medications on the form, please bring the medication bottles to your appointment and we will list them.  We will need to be aware of both prescribed and over the counter drugs.  We will need to know exact dosage information as well.    Please be prepared to read and sign documents such as consent forms, a financial agreement, and acknowledgement forms.  If necessary, and with your consent, a friend or relative is welcome to sit-in on the nurse visit with you.  Please bring your insurance card so that we may make a copy of it.  If your insurance requires a referral to see a specialist, please bring your referral form from your primary care physician.  No co-pay is required for this nurse visit.     If you cannot keep your appointment, please call [phone] to cancel or reschedule prior to  your appointment date.  This allows [place] the opportunity to schedule an appointment for another patient in need of care.    Thank you for choosing [facility] for your medical needs.  We appreciate the opportunity to care for you.  Please visit [place] at our website  to learn more about our practice.  Sincerely, The Gastroenterology Division

## 2010-10-29 ENCOUNTER — Telehealth: Payer: Self-pay

## 2010-10-29 ENCOUNTER — Encounter: Payer: Self-pay | Admitting: Gastroenterology

## 2010-11-02 ENCOUNTER — Telehealth: Payer: Self-pay | Admitting: Cardiovascular Disease

## 2010-11-02 NOTE — Telephone Encounter (Signed)
Pt calling to see if she needs to take her antibotics before her colonoscopy

## 2010-11-02 NOTE — Telephone Encounter (Signed)
Called patient back-patient was told by her Gastroenterologist that it was not necessary for her to take antibiotics prior to her colonoscopy but patient decided to take it after her procedure just to be safe.  Advised that should be ok to do that.

## 2010-11-03 ENCOUNTER — Encounter: Payer: Self-pay | Admitting: Cardiovascular Disease

## 2010-11-08 NOTE — Letter (Signed)
Summary: [facility] Instructions  [facility]  [address] [place], [place] [address]   Phone: [phone]  Fax: [phone]       [name]    September 16, 1955    MRN: [id number]        Procedure Day Dorna Bloom:  Duanne Limerick  11/12/10     Arrival Time:  7:30AM     Procedure Time:  8:30AM     Location of Procedure:                    [facility] (4th Floor)                      PREPARATION FOR COLONOSCOPY WITH MOVIPREP   Starting 5 days prior to your procedure 11/07/10 do not eat nuts, seeds, popcorn, corn, beans, peas,  salads, or any raw vegetables.  Do not take any fiber supplements (e.g. Metamucil, Citrucel, and Benefiber).  THE DAY BEFORE YOUR PROCEDURE         DATE: 11/11/10  DAY: SUNDAY  1.  Drink clear liquids the entire day-NO SOLID FOOD  2.  Do not drink anything colored red or purple.  Avoid juices with pulp.  No orange juice.  3.  Drink at least 64 oz. (8 glasses) of fluid/clear liquids during the day to prevent dehydration and help the prep work efficiently.  CLEAR LIQUIDS INCLUDE: Water Jello Ice Popsicles Tea (sugar ok, no milk/cream) Powdered fruit flavored drinks Coffee (sugar ok, no milk/cream) Gatorade Juice: apple, white grape, white cranberry  Lemonade Clear bullion, consomm, broth Carbonated beverages (any kind) Strained chicken noodle soup Hard Candy                             4.  In the morning, mix first dose of MoviPrep solution:    Empty 1 Pouch A and 1 Pouch B into the disposable container    Add lukewarm drinking water to the top line of the container. Mix to dissolve    Refrigerate (mixed solution should be used within 24 hrs)  5.  Begin drinking the prep at 5:00 p.m. The MoviPrep container is divided by 4 marks.   Every 15 minutes drink the solution down to the next mark (approximately 8 oz) until the full liter is complete.   6.  Follow completed prep with 16 oz of clear liquid of your choice (Nothing red or  purple).  Continue to drink clear liquids until bedtime.  7.  Before going to bed, mix second dose of MoviPrep solution:    Empty 1 Pouch A and 1 Pouch B into the disposable container    Add lukewarm drinking water to the top line of the container. Mix to dissolve    Refrigerate  THE DAY OF YOUR PROCEDURE      DATE: 11/12/10   DAY: MONDAY  Beginning at 3:30AM (5 hours before procedure):         1. Every 15 minutes, drink the solution down to the next mark (approx 8 oz) until the full liter is complete.  2. Follow completed prep with 16 oz. of clear liquid of your choice.    3. You may drink clear liquids until 6:30AM (2 HOURS BEFORE PROCEDURE).   MEDICATION INSTRUCTIONS  Unless otherwise instructed, you should take regular prescription medications with a small sip of water   as early as possible the morning of  your procedure.       OTHER INSTRUCTIONS  You will need a responsible adult at least 53 years of age to accompany you and drive you home.   This person must remain in the waiting room during your procedure.  Wear loose fitting clothing that is easily removed.  Leave jewelry and other valuables at home.  However, you may wish to bring a book to read or  an iPod/MP3 player to listen to music as you wait for your procedure to start.  Remove all body piercing jewelry and leave at home.  Total time from sign-in until discharge is approximately 2-3 hours.  You should go home directly after your procedure and rest.  You can resume normal activities the  day after your procedure.  The day of your procedure you should not:   Drive   Make legal decisions   Operate machinery   Drink alcohol   Return to work  You will receive specific instructions about eating, activities and medications before you leave.    The above instructions have been reviewed and explained to me by   Doristine Church RN II  October 29, 2010 5:01 PM     I fully understand and can verbalize  these instructions _____________________________ Date _________

## 2010-11-08 NOTE — Miscellaneous (Signed)
Summary: [facility]  Clinical Lists Changes  Medications: Added new medication of MOVIPREP 100 GM  SOLR (PEG-KCL-NACL-NASULF-NA ASC-C) As per prep instructions. - Signed Rx of MOVIPREP 100 GM  SOLR (PEG-KCL-NACL-NASULF-NA ASC-C) As per prep instructions.;  #1 x 0;  Signed;  Entered by: Doristine Church RN II;  Authorized by: Rachael Fee MD;  Method used: Electronically to [facility] #[id number]*, [address], [place], [place], [place]  [address], Ph: [phone], Fax: [phone] Observations: Added new observation of ALLERGY REV: Done (10/29/2010 16:20)    Prescriptions: MOVIPREP 100 GM  SOLR (PEG-KCL-NACL-NASULF-NA ASC-C) As per prep instructions.  #1 x 0   Entered by:   Doristine Church RN II   Authorized by:   Rachael Fee MD   Signed by:   Doristine Church RN II on 10/29/2010   Method used:   Electronically to        [facility]. [phone]* (retail)       [address]       [place], [place]  [address]       Ph: [phone]       Fax: [phone]   RxID:   [id number]

## 2010-11-08 NOTE — Progress Notes (Signed)
Summary: triage  Phone Note Call from Patient   Caller: Patient Summary of Call: pt normally takes Amoxicillin 500mg  4 tabs ONE hr before procedures; has med from another MD available; same med, dose, etc?  Should she take it 2 hrs before instead of one for our 2 hr NPO instructions? Initial call taken by: Doristine Church RN II,  October 29, 2010 5:13 PM  Follow-up for Phone Call        she does not need to be on abx for colonoscopy Follow-up by: Rachael Fee MD,  October 30, 2010 8:17 AM  Additional Follow-up for Phone Call Additional follow up Details #1::        pt aware Additional Follow-up by: Chales Abrahams CMA Duncan Dull),  October 30, 2010 10:09 AM

## 2010-11-09 ENCOUNTER — Encounter: Payer: Self-pay | Admitting: Gastroenterology

## 2010-11-12 ENCOUNTER — Encounter: Payer: Self-pay | Admitting: Gastroenterology

## 2010-11-12 ENCOUNTER — Ambulatory Visit (AMBULATORY_SURGERY_CENTER): Payer: PRIVATE HEALTH INSURANCE | Admitting: Gastroenterology

## 2010-11-12 VITALS — BP 158/97 | HR 106 | Temp 98.1°F | Resp 20 | Ht 62.0 in | Wt 175.0 lb

## 2010-11-12 DIAGNOSIS — D126 Benign neoplasm of colon, unspecified: Secondary | ICD-10-CM

## 2010-11-12 DIAGNOSIS — Z139 Encounter for screening, unspecified: Secondary | ICD-10-CM

## 2010-11-12 DIAGNOSIS — Z1211 Encounter for screening for malignant neoplasm of colon: Secondary | ICD-10-CM

## 2010-11-12 NOTE — Patient Instructions (Signed)
Discharge instructions reviewed  Will receive pathology results via letter  In approximately 2 weeks.  Education material on polyps given.

## 2010-11-13 ENCOUNTER — Ambulatory Visit (INDEPENDENT_AMBULATORY_CARE_PROVIDER_SITE_OTHER): Payer: PRIVATE HEALTH INSURANCE | Admitting: Cardiovascular Disease

## 2010-11-13 ENCOUNTER — Telehealth: Payer: Self-pay | Admitting: *Deleted

## 2010-11-13 ENCOUNTER — Encounter: Payer: Self-pay | Admitting: Cardiovascular Disease

## 2010-11-13 VITALS — BP 132/80 | HR 79 | Resp 18 | Ht 62.0 in | Wt 181.8 lb

## 2010-11-13 DIAGNOSIS — I350 Nonrheumatic aortic (valve) stenosis: Secondary | ICD-10-CM

## 2010-11-13 DIAGNOSIS — I359 Nonrheumatic aortic valve disorder, unspecified: Secondary | ICD-10-CM

## 2010-11-13 NOTE — Patient Instructions (Signed)
Your physician has requested that you have an exercise tolerance test. For further information please visit [email or web address] Please also follow instruction sheet, as given.  Your physician recommends that you continue on your current medications as directed. Please refer to the Current Medication list given to you today.  Your physician has requested that you have an echocardiogram in 6 MONTHS (prior to your office visit). Echocardiography is a painless test that uses sound waves to create images of your heart. It provides your doctor with information about the size and shape of your heart and how well your heart's chambers and valves are working. This procedure takes approximately one hour. There are no restrictions for this procedure.  Your physician wants you to follow-up in: 6 MONTHS. You will receive a reminder letter in the mail two months in advance. If you don't receive a letter, please call our office to schedule the follow-up appointment.

## 2010-11-13 NOTE — Telephone Encounter (Signed)

## 2010-11-14 ENCOUNTER — Telehealth: Payer: Self-pay | Admitting: Gastroenterology

## 2010-11-14 NOTE — Telephone Encounter (Signed)
Pt aware of Dr Christella Hartigan response she will call if symptoms do not improve

## 2010-11-14 NOTE — Telephone Encounter (Signed)
Cramping should improve over next 1-2 days.  If she worsens then she should call back

## 2010-11-14 NOTE — Telephone Encounter (Signed)
Left message on machine to call back  

## 2010-11-14 NOTE — Telephone Encounter (Signed)
Had colon on Monday and still having some lower abd  cramping and diarrhea today x 2 this morning.  No blood or fever.  Pt has returned to her normal eating pattern.  Please advise

## 2010-11-14 NOTE — Assessment & Plan Note (Signed)
The patient's aortic stenosis is in the moderate range based on her exam and findings from noninvasive studies. However, her symptoms of exertional lightheadedness are worrisome. I have recommended checking an exercise treadmill stress test to evaluate hemodynamic changes with exercise as well as symptoms, exercise tolerance, and presence of ST/T-wave changes. Will also schedule her back for followup in 6 months with a repeat echocardiogram at that time. This will mark a one-year interval from her previous echo.

## 2010-11-14 NOTE — Progress Notes (Signed)
HPI:  This is a 55 year old woman presenting for followup of aortic stenosis.  The patient has a long-standing heart murmur and she has been been followed in the past for "aortic sclerosis." Her murmur intensity increased and she underwent an echocardiogram in September 2011 demonstrating significant aortic stenosis with a peak gradient of 44 and a mean gradient of 26 mmHg.  She underwent a transesophageal echocardiogram demonstrating a dysplastic aortic valve, probably bicuspid, with a planimetered valve area of 1.2 cm.  Her valve area calculated from her surface echo was in the range of 0.8-0.9 cm.  She reports 2 episodes where she did very brisk walking when she got in a big hurry.  With that she developed palpitations and lightheadedness. She had to sit down because she felt as if she was going to pass out. She denies chest pain or dyspnea. She denies edema, orthopnea, or PND. With routine exercise she denies any exertional symptoms.  Outpatient Encounter Prescriptions as of 11/13/2010  Medication Sig Dispense Refill  . amoxicillin (AMOXIL) 500 MG capsule Take 500 mg by mouth. Take 4 capsules prior dentist procedure      . rosuvastatin (CRESTOR) 20 MG tablet Take 20 mg by mouth daily.          Allergies  Allergen Reactions  . Atorvastatin     REACTION: myalgias Lipitor  . Codeine     REACTION: N/V  . Niacin     REACTION: flushing    Past Medical History  Diagnosis Date  . Hyperlipidemia   . Multinodular thyroid   . GERD (gastroesophageal reflux disease)   . DJD (degenerative joint disease)   . Hx of hysterectomy   . Palpitation   . Aortic stenosis   . Heart murmur   . Anxiety     for hot flashes    ROS: Negative except as per HPI  BP 132/80  Pulse 79  Resp 18  Ht 5\' 2"  (1.575 m)  Wt 181 lb 12.8 oz (82.464 kg)  BMI 33.25 kg/m2  PHYSICAL EXAM: Pt is alert and oriented, very pleasant overweight woman in NAD HEENT: normal Neck: JVP - normal, carotids 2+= without  bruits Lungs: CTA bilaterally CV: RRR with a grade 3/6 harsh systolic murmur at the right upper sternal border with a preserved A2 component. Abd: soft, NT, Positive BS, no hepatomegaly Ext: no C/C/E, distal pulses intact and equal Skin: warm/dry no rash  EKG:  Normal sinus rhythm with heart rate 76 beats per minute, within normal limits.  ASSESSMENT AND PLAN:

## 2010-12-06 ENCOUNTER — Ambulatory Visit (INDEPENDENT_AMBULATORY_CARE_PROVIDER_SITE_OTHER): Payer: PRIVATE HEALTH INSURANCE | Admitting: Cardiovascular Disease

## 2010-12-06 ENCOUNTER — Encounter: Payer: PRIVATE HEALTH INSURANCE | Admitting: Cardiovascular Disease

## 2010-12-06 DIAGNOSIS — I350 Nonrheumatic aortic (valve) stenosis: Secondary | ICD-10-CM

## 2010-12-06 DIAGNOSIS — I359 Nonrheumatic aortic valve disorder, unspecified: Secondary | ICD-10-CM

## 2010-12-06 DIAGNOSIS — R42 Dizziness and giddiness: Secondary | ICD-10-CM

## 2010-12-06 NOTE — Progress Notes (Signed)
Exercise Treadmill Test  Pre-Exercise Testing Evaluation Rhythm: normal sinus  Rate: 82   PR:  .14 QRS:  .07  QT:  .36 QTc: .42     Test  Exercise Tolerance Test Ordering MD: Tonny Bollman, MD  Interpreting MD:  Tonny Bollman, MD  Unique Test No: 1  Treadmill:  1  Indication for ETT: Dizziness  Contraindication to ETT: No   Stress Modality: exercise - treadmill  Cardiac Imaging Performed: non   Protocol: standard Bruce - maximal  Max BP:  172/85  Max MPHR (bpm):  167 85% MPR (bpm):  141  MPHR obtained (bpm):  148 % MPHR obtained:  88%  Reached 85% MPHR (min:sec):  4:05 Total Exercise Time (min-sec):  6:00  Workload in METS:  7.0 Borg Scale: 17  Reason ETT Terminated:  dyspnea    ST Segment Analysis At Rest: normal ST segments - no evidence of significant ST depression With Exercise: no evidence of significant ST depression  Other Information Arrhythmia:  No Angina during ETT:  absent (0) Quality of ETT:  diagnostic  ETT Interpretation:  normal - no evidence of ischemia by ST analysis  Comments: Negative for ischemia. No angina or arrhythmia with exertion. Fair exercise tolerance.  Recommendations: Graded exercise program. Followup echo in 6 months to reassess aortic stenosis.

## 2010-12-19 ENCOUNTER — Encounter: Payer: Self-pay | Admitting: Internal Medicine

## 2010-12-19 DIAGNOSIS — Z0000 Encounter for general adult medical examination without abnormal findings: Secondary | ICD-10-CM | POA: Insufficient documentation

## 2010-12-20 ENCOUNTER — Other Ambulatory Visit (INDEPENDENT_AMBULATORY_CARE_PROVIDER_SITE_OTHER): Payer: PRIVATE HEALTH INSURANCE

## 2010-12-20 ENCOUNTER — Encounter: Payer: Self-pay | Admitting: Internal Medicine

## 2010-12-20 ENCOUNTER — Ambulatory Visit (INDEPENDENT_AMBULATORY_CARE_PROVIDER_SITE_OTHER): Payer: PRIVATE HEALTH INSURANCE | Admitting: Internal Medicine

## 2010-12-20 ENCOUNTER — Other Ambulatory Visit: Payer: Self-pay | Admitting: Internal Medicine

## 2010-12-20 VITALS — BP 130/80 | HR 81 | Temp 98.3°F | Ht 62.0 in | Wt 181.5 lb

## 2010-12-20 DIAGNOSIS — IMO0001 Reserved for inherently not codable concepts without codable children: Secondary | ICD-10-CM

## 2010-12-20 DIAGNOSIS — R220 Localized swelling, mass and lump, head: Secondary | ICD-10-CM

## 2010-12-20 DIAGNOSIS — R221 Localized swelling, mass and lump, neck: Secondary | ICD-10-CM

## 2010-12-20 DIAGNOSIS — Z0000 Encounter for general adult medical examination without abnormal findings: Secondary | ICD-10-CM

## 2010-12-20 DIAGNOSIS — R300 Dysuria: Secondary | ICD-10-CM | POA: Insufficient documentation

## 2010-12-20 LAB — BASIC METABOLIC PANEL
CO2: 30 mEq/L (ref 19–32)
Calcium: 9.4 mg/dL (ref 8.4–10.5)
Chloride: 103 mEq/L (ref 96–112)
Sodium: 141 mEq/L (ref 135–145)

## 2010-12-20 LAB — POCT URINALYSIS DIPSTICK
Ketones, UA: NEGATIVE
Protein, UA: NEGATIVE
Spec Grav, UA: 1.01

## 2010-12-20 LAB — HEMOGLOBIN A1C: Hgb A1c MFr Bld: 7.2 % — ABNORMAL HIGH (ref 4.6–6.5)

## 2010-12-20 MED ORDER — LEVOFLOXACIN 250 MG PO TABS: 250.0000 mg | ORAL_TABLET | Freq: Every day | ORAL | Status: AC

## 2010-12-20 MED ORDER — METFORMIN HCL 500 MG PO TABS: 500.0000 mg | ORAL_TABLET | Freq: Every day | ORAL | Status: DC

## 2010-12-20 NOTE — Assessment & Plan Note (Signed)
Mild to mod, prob UTI though udip seems neg;  For antibx course, and  to f/u any worsening symptoms or concerns

## 2010-12-20 NOTE — Progress Notes (Signed)
Subjective:    Patient ID: [name], female    DOB: 1955-12-14, 54 y.o.   MRN: [id number]  HPI  Here with acute onset 1 wk GU symptoms of dysuria and freq, without urgency or hematirua, with lower back discomfort, but no n/v, other abd pain/flank pain, or high fever, chills.  Also incidentally saw her dentist today who noted no oral problems, but a sort of fullness to the left neck and advised coming here today.   Pt denies polydipsia, polyuria, or low sugar symptoms such as weakness or confusion improved with po intake.  Pt states overall good compliance with meds, trying to follow lower cholesterol, diabetic diet, wt overall stable but little exercise however.     Pt denies chest pain, increased sob or doe, wheezing, orthopnea, PND, increased LE swelling, palpitations, dizziness or syncope.  Pt denies new neurological symptoms such as new headache, or facial or extremity weakness or numbness   Pt denies fever, wt loss, night sweats, loss of appetite, or other constitutional symptoms Past Medical History  Diagnosis Date  . Hyperlipidemia   . Multinodular thyroid   . GERD (gastroesophageal reflux disease)   . DJD (degenerative joint disease)   . Hx of hysterectomy   . Palpitation   . Aortic stenosis   . Heart murmur   . Anxiety     for hot flashes   Past Surgical History  Procedure Date  . Cesarean section   . Bunionectomy   . Partial hysterectomy     reports that she has quit smoking. She does not have any smokeless tobacco history on file. She reports that she does not drink alcohol. Her drug history not on file. family history includes COPD in her father; Cancer (age of onset:72) in her mother; Coronary artery disease in her father; Diabetes in her mother; and Hypertension in her other. Allergies  Allergen Reactions  . Atorvastatin     REACTION: myalgias Lipitor  . Codeine     REACTION: N/V  . Niacin     REACTION: flushing   Current Outpatient Prescriptions on File Prior  to Visit  Medication Sig Dispense Refill  . rosuvastatin (CRESTOR) 20 MG tablet Take 20 mg by mouth daily.        Marland Kitchen DISCONTD: amoxicillin (AMOXIL) 500 MG capsule Take 500 mg by mouth. Take 4 capsules prior dentist procedure       Review of Systems Review of Systems  Constitutional: Negative for diaphoresis and unexpected weight change.  HENT: Negative for drooling and tinnitus.   Eyes: Negative for photophobia and visual disturbance.  Respiratory: Negative for choking and stridor.   Gastrointestinal: Negative for vomiting and blood in stool.  Genitourinary: Negative for hematuria and decreased urine volume.  Musculoskeletal: Negative for gait problem.  Skin: Negative for color change and wound.  Neurological: Negative for tremors and numbness.  Psychiatric/Behavioral: Negative for decreased concentration. The patient is not hyperactive.       Objective:   Physical Exam BP 130/80  Pulse 81  Temp(Src) 98.3 F (36.8 C) (Oral)  Ht 5\' 2"  (1.575 m)  Wt 181 lb 8 oz (82.328 kg)  BMI 33.20 kg/m2  SpO2 97% Physical Exam  VS noted Constitutional: Pt appears well-developed and well-nourished.  HENT: Head: Normocephalic.  Right Ear: External ear normal.  Left Ear: External ear normal.  Eyes: Conjunctivae and EOM are normal. Pupils are equal, round, and reactive to light.  Neck: Normal range of motion. Neck supple but has an unusual mult  mid left neck adn submandibular nodular masses somewhat nondiscrete but possbily > 1 cm , all pre-scm, no masses to post SCM chain or supraclavicular;  No right sided LA , no thyroid enlargement or mass  Cardiovascular: Normal rate and regular rhythm.   Pulmonary/Chest: Effort normal and breath sounds normal.  Abd:  Soft, NT, non-distended, + BS, no flank tender, no spine tender Neurological: Pt is alert. No cranial nerve deficit.  Skin: Skin is warm. No erythema.  Psychiatric: Pt behavior is normal. Thought content normal.         Assessment & Plan:

## 2010-12-20 NOTE — Patient Instructions (Signed)
Take all new medications as prescribed Your urine will be sent for the culture Please go to LAB in the Basement for the blood and/or urine tests to be done today You will be contacted regarding the referral for: CT scan of the neck Please call the number on the [facility] (the [facility]) for results of testing in 2-3 days Ok to cancel appt in July 2012;  Please return in 3 mo with Lab testing done 3-5 days before

## 2010-12-20 NOTE — Assessment & Plan Note (Signed)
Nontender, mult subq masses left neck pre-scm only (likely the anterior chain);  No obvious infection or URI  And mass nontender and mostly fixed;  For neck CT with CM, refer ENT

## 2010-12-20 NOTE — Assessment & Plan Note (Signed)
stable overall by hx and exam, most recent lab reviewed with pt, and pt to continue medical treatment as before Lab Results  Component Value Date   HGBA1C 7.2* 12/20/2010

## 2010-12-21 ENCOUNTER — Telehealth: Payer: Self-pay | Admitting: Internal Medicine

## 2010-12-21 MED ORDER — CEPHALEXIN 500 MG PO CAPS: 500.0000 mg | ORAL_CAPSULE | Freq: Four times a day (QID) | ORAL | Status: AC

## 2010-12-21 NOTE — Telephone Encounter (Signed)
Pt advised of Rx change and pharmacy

## 2010-12-21 NOTE — Telephone Encounter (Signed)
Due to stomach cramp, ok to stop the levaquin  Start the cephalexin - done per emr

## 2010-12-24 LAB — URINE CULTURE: Colony Count: >=100000

## 2010-12-25 ENCOUNTER — Ambulatory Visit (INDEPENDENT_AMBULATORY_CARE_PROVIDER_SITE_OTHER)
Admission: RE | Admit: 2010-12-25 | Discharge: 2010-12-25 | Disposition: A | Discharge status: Home / Self Care | Payer: PRIVATE HEALTH INSURANCE | Source: Ambulatory Visit | Attending: Internal Medicine | Admitting: Internal Medicine

## 2010-12-25 DIAGNOSIS — R220 Localized swelling, mass and lump, head: Secondary | ICD-10-CM

## 2010-12-25 MED ORDER — IOHEXOL 300 MG/ML  SOLN
75.0000 mL | Freq: Once | INTRAMUSCULAR | Status: AC | PRN
  Administered 2010-12-25: 75 mL via INTRAVENOUS

## 2010-12-25 NOTE — Assessment & Plan Note (Signed)
[facility]                            CARDIOLOGY OFFICE NOTE   MAKHYA, ARAVE                       MRN:          [id number]  DATE:02/16/2007                            DOB:          1955-09-23    [name] was seen in followup at the [facility] Cardiology office on  February 16, 2007.  She was initially evaluated here in April for  palpitations.  She described a 80-month history of hot flashes and  associated palpitations.  She also has had a heart murmur that was noted  dating back to 2004.  At my initial evaluation, her heart murmur was  noted to be a systolic ejection murmur and I suspected aortic sclerosis.  An echocardiogram confirmed this and she had a mildly thickened aortic  valve as well as hyperdynamic LV function with an EF of 70-75%.  The  mean transaortic valve gradient was 13 mmHg.  There was no evidence of  aortic stenosis.  A Holter monitor was also performed which showed only  rare premature atrial contractions.  The basic rhythm was sinus and  there were no other arrhythmias noted.  In the interim, she describes no  change in her symptoms.  She has palpitations only at the time of hot  flash.  She has no exertional symptoms.   CURRENT MEDICATIONS:  Effervescent triple C 1000 mg daily.   PHYSICAL EXAMINATION:  GENERAL:  Alert and oriented in no acute  distress.  VITAL SIGNS:  Weight 173 pounds, blood pressure 124/86, heart rate 78,  respirations 16.  HEENT:  Normal.  NECK:  Normal carotid upstrokes without bruits.  Jugular venous pressure  normal.  LUNGS:  Clear to auscultation bilaterally.  HEART:  Regular rate and rhythm with a 2/6 systolic ejection murmur at  the right upper sternal border.  There are no gallops or diastolic  murmurs present.  ABDOMEN:  Soft, obese, nontender, no organomegaly.  EXTREMITIES:  No clubbing, cyanosis or edema.  Peripheral pulses are 2+  and equal throughout.   ASSESSMENT/PLAN:  1. [name]  is a 55 year old woman with benign palpitations.  Her      Holter monitoring is reassuring as is her structurally normal heart      by echocardiography.  Recommend continued limitation of caffeine.      She drinks one small cup of coffee daily and otherwise has no      caffeine intake.  I also recommended a regular exercise program and      adequate hydration.  If symptoms worsen, could consider beta-      blocker therapy in the future, but this is not warranted at this      time.  2. Aortic sclerosis.  I would like to see [name] back on a yearly      basis just to exam her and rule out progression of her aortic valve      sclerosis.  She should not require serial echocardiography unless      there is a change in the nature of her heart murmur on physical  examination.  3. Dyslipidemia.  Labs from April of this year showed a total      cholesterol of 231 with triglycerides of 180, HDL 54 and LDL      cholesterol of 146.  Will defer to Dr. Artist Pais.  Will note that aortic      sclerosis has been associated with coronary atherosclerosis and it      may be prudent to treat her aggressively if she fails a trial of      lifestyle modification and diet.  There are no strict guidelines      regarding this, but starting Statin therapy after a several month      trial of lifestyle modification would be a reasonable      consideration.  4. I would like to see [name] back on a yearly basis as detailed      above.  I would be happier to see her back sooner if any problems      arise.     Veverly Fells. Excell Seltzer, MD  Electronically Signed    MDC/MedQ  DD: 02/16/2007  DT: 02/17/2007  Job #: [id number]   cc:   Barbette Hair. Artist Pais, DO

## 2010-12-28 ENCOUNTER — Telehealth: Payer: Self-pay | Admitting: *Deleted

## 2010-12-28 MED ORDER — PHENAZOPYRIDINE HCL 100 MG PO TABS: 100.0000 mg | ORAL_TABLET | Freq: Three times a day (TID) | ORAL | Status: DC | PRN

## 2010-12-28 NOTE — Letter (Signed)
December 03, 2006    Barbette Hair. Artist Pais, DO  [address] [place], [place] [address]   RE:  [name]  MRN:  [id number]  /  DOB:  03-23-1956   Dear Dr. Artist Pais:   It was my pleasure to see [name] as an outpatient at the [facility]  Cardiology Office on December 03, 2006.   As you know, she is a 55 year old woman who presents with a chief  complaint of palpitations.   [name] describes a 32-month history of hot flashes and palpitations.  She believes she is perimenopausal, and relates her hot flashes to that.  She has had an increase in her palpitations over this same time period.  She has palpitations most prominently at rest in the evening.  She does  have some associated lightheadedness as well as chest pressure when she  has palpitations.  She also describes a panicky feeling.  Occasionally  she complains of palpitations when she takes a warm shower, or when she  gets hot with working out at the gym.  She exercises regularly, and  really does not have any exertional symptoms other than expected  shortness of breath with high-level strenuous activity.   [name] was discovered to have a heart murmur back in 2004, and  underwent some testing at that time that apparently included an  echocardiogram as well as a treadmill stress test.  By her recollection,  all of those things were normal.   CURRENT MEDICATIONS:  Include only include effervescent triple C 1000 mg  daily.   ALLERGIES:  NONE.   DRUG INTOLERANCES:  CODEINE HAS CAUSED VOMITING.   PAST MEDICAL HISTORY:  Pertinent only for hysterectomy in 1995,  bunionectomy in 1986, and C-section in 1983.  There is no history of  hypertension, diabetes, or other medical problems.  The patient has not  been hospitalized in the past.   FAMILY HISTORY:  There is no history of coronary artery disease in the  family.  Patient's father is alive and well at age 15.  Her mother died  at age 4 of cholangiocarcinoma, and also had  diabetes.   SOCIAL HISTORY:  The patient is married.  She lives locally.  She has a  37 year old daughter who also lives in the area.  She is a former  smoker, but quit back in 1990.  She does drink alcohol, and has not used  illicit drugs.  She drinks 1 cup of coffee daily.  She exercises  regularly walking 2 to 4 miles at least 3 times weekly, as well as  working out at the gym 2 to 4 times per week.  When she goes to the gym,  she does 20 minutes of aerobic activity, and 20 minutes of weight  lifting.   REVIEW OF SYSTEMS:  Complete 12-point review of systems was performed.  Pertinent positives included history of reflux, as well as menstrual  dysfunction, generalized fatigue, and a history of a liver hemangioma,  occasional headaches.   PHYSICAL EXAMINATION:  The patient is alert and oriented.  She is in no  acute distress.  She is a well-nourished, well-developed woman.  Her weight is 167 pounds.  Blood pressure is 122/82.  Heart rate is 84.  Respiratory rate 16.  HEENT:  Normal.  NECK:  Normal carotid upstrokes without bruits.  Jugular venous pressure  is normal.  There is no thyromegaly or thyroid nodules.  LUNGS:  Clear to auscultation bilaterally.  HEART:  The  apex is discrete and nondisplaced.  There is no right  ventricular heave or lift.  The heart is regular rate and rhythm with a  2/6 systolic ejection murmur only heard at the right upper sternal  border.  There are no diastolic murmurs or gallops.  ABDOMEN:  Soft and non-tender.  No organomegaly.  No abdominal bruits.  BACK:  There is no flank tenderness.  EXTREMITIES:  No clubbing, cyanosis, or edema.  Peripheral pulses are 2+  and equal throughout.  SKIN:  Warm and dry without rash.  NEUROLOGIC:  Cranial nerves 2 through 12 are intact.  Strength is 5/5  and equal in the arms and legs bilaterally.   EKG demonstrates normal sinus rhythm with poor R wave progression across  the precordium.  Cannot rule out anterior  infarct, and possible left  atrial enlargement.   Laboratory data shows an unremarkable CBC and chemistry profile.  Total  cholesterol is 231 with an HDL of 55, and an LDL cholesterol of 146.  TSH is normal at 2.3.   ASSESSMENT:  [name] is a 55 year old woman with palpitations.  She  does have associated symptoms, and her palpitations are symptomatically  very bothersome for her.   She has no evidence on physical exam of structural heart disease.  She  does have a systolic ejection murmur consistent with aortic sclerosis.  Her EKG raises the possibility of age indeterminate anterior infarct,  but I suspect that it is a normal variant.  With her abnormal ECG, I  would like to perform an echocardiogram to make sure that she has normal  left ventricular wall motion.  Would also like to perform a Holter  monitor to further evaluate her palpitations.  She has daily symptoms  that we should be able to capture any arrhythmia with a 24-hour Holter.   Depending on what we find on the Holter monitor, we could consider  adding a low-dose beta blocker if she is having premature atrial  contractions or premature ventricular contractions that are causing her  symptoms.  We will review this at the time of her followup visit after  her Holter monitor and echocardiogram are performed.   Dr. Artist Pais, thanks again for allowing me to see [name].  I will be in  touch after I see her back in followup.  Please feel free to contact me  at anytime with questions regarding her care.    Sincerely,      Veverly Fells. Excell Seltzer, MD  Electronically Signed    MDC/MedQ  DD: 12/03/2006  DT: 12/03/2006  Job #: [id number]

## 2010-12-28 NOTE — Telephone Encounter (Signed)
Pt states that she was seen for bladder infection last week and was given ABX; she is still having quite a bit of bladder pain, but does still have enough antibiotic for this weekend. Pt states that she just wanted to touch base w/you before the weekend.

## 2010-12-28 NOTE — Telephone Encounter (Signed)
Pt to finish antibx please, also sent rx for pyridium per emr to pharmacy

## 2010-12-28 NOTE — Telephone Encounter (Signed)
Pt. Informed.

## 2010-12-28 NOTE — Assessment & Plan Note (Signed)
[facility]                           PRIMARY CARE OFFICE NOTE   LAVERA, VANDERMEER                       MRN:          [id number]  DATE:11/20/2006                            DOB:          05-Nov-1955    CHIEF COMPLAINT:  New patient to practice.   HISTORY OF PRESENT ILLNESS:  The patient is a 55 year old female here to  establish for primary care.  She was previously followed by Dr. Clovis Riley at [facility].  Her past medical history is significant  for heart murmur and elevated cholesterol readings in the past.  She  apparently had some issues with palpitations in the past and underwent a  stress test and a 2 D echocardiogram at [facility] approximately 5  years ago.  She reported a normal 2 D echo and a normal stress test.  Since that time she is still having occasional palpitations where she  senses a fast heart rate, and there have been times when she has  experienced some mild dizziness along with the palpitations.  There is  no report of any chest discomfort.   She underwent hysterectomy in 1996 and over the last 5 years has had  periodic hot flashes.  This has worsened in severity over the last  several months.  She has extreme hot sensations that are somewhat  debilitating.   Other medical issues include high cholesterol.  She was on Niaspan in  the past.  She has not taken this for a year and a half.  She lost  approximately 45 pounds and went off of Vytorin.  Her recent cholesterol  blood tests notable for elevated triglycerides at 180 and LDL at 146.2.  She would like to avoid taking medications if possible.  She has been  somewhat less careful about dietary habits.  She recently suffered the  death of her mother, who died from cholangiocarcinoma; however, she is  getting back into her normal exercise routine and her normal diet.   PAST MEDICAL HISTORY SUMMARY:  1. Hyperlipidemia.  2. History of heart murmur.  3. History of palpitations with negative stress and echo.  4. Questionable history of thyroid nodule by ultrasound.  5. History of migraine headache.  6. Status post hysterectomy in 1996.   CURRENT MEDICATIONS:  None.   ALLERGIES TO MEDICATIONS:  CODEINE, which causes vomiting.   SOCIAL HISTORY:  The patient is married.  She has a 53 year old daughter  and is currently working as a Production manager for [employer].   FAMILY HISTORY:  Mother deceased at age 51 secondary to  cholangiocarcinoma.  She was also diabetic.  Father age 73 with COPD and  coronary artery disease.   HABITS:  No alcohol.  She has a remote history of tobacco use.   PREVENTIVE CARE HISTORY:  Her last Pap was in February 2007 and last  mammogram was in July 2007.   REVIEW OF SYSTEMS:  No fever besides hot sensation.  No HEENT symptoms.  Cardiovascular complaints as noted above.  No chronic cough.  Denies  heartburn, nausea or vomiting, constipation, diarrhea.  All other  systems negative.   PHYSICAL EXAMINATION:  VITAL SIGNS:  Weight is 164 pounds.  Temperature  is 96.9, pulse of 73, BP is 129/76 in the left arm in the seated  position.  GENERAL:  The patient is a pleasant, well-developed, well-nourished 13-  year-old white female who appears her stated age.  HEENT:  Normocephalic, atraumatic.  Pupils were equal and reactive to  light bilaterally.  Extraocular motility was intact.  The patient was  anicteric.  Conjunctivae were within normal limits.  External auditory  canals and tympanic membranes were clear bilaterally.  Hearing was  grossly normal.  Oropharyngeal exam was unremarkable.  NECK:  Supple.  I could not appreciate any thyroid nodules or  thyromegaly, no carotid bruit.  CHEST:  Normal inspiratory effort.  Chest was clear to auscultation  bilaterally.  No rhonchi, rales or wheezing.  CARDIOVASCULAR:  Regular rate and rhythm.  Positive systolic ejection  murmur 1-2/6 on the left sternal border,  holosystolic.  No significant  change with left hand grip.  ABDOMEN:  Soft, nontender, positive bowel sounds.  No organomegaly.  EXTREMITIES:  No clubbing, cyanosis, or edema.  SKIN:  Warm and dry.  NEUROLOGIC:  Cranial nerves II-XII were grossly intact.  She was  nonfocal.   EKG was performed in the office, which showed normal sinus rhythm at 72  beats per minute.  She had upright T-waves in I and II, however inverted  T waves at III.  This may be due to lead positioning.  Otherwise, no ST-  T changes were noted.   LABORATORY DATA:  Recent CBC shows WBC 6.2, H&H of 13.9 and 39.9,  platelets of 276.  Basic metabolic profile notable for normal glucose of  98, BUN 13, creatinine 0.8.  LFTs were unremarkable.  Total cholesterol  233, triglycerides 180, HDL 54.5, and LDL 146.  TSH was 2.31.  A UA was  unremarkable.   IMPRESSION:  1. Palpitations, symptomatic.  2. Hot flashes, likely secondary to menopause.  3. Hyperlipidemia.  4. History of thyroid nodule.  5. Health maintenance.   RECOMMENDATIONS:  The patient will be referred to cardiology for further  workup regarding palpitations.  She may need Holter monitor.  We will  try to obtain the previous 2 D echocardiogram performed at [facility].  I will defer further testing to cardiology.   We will check a free T3 and total T4 level today.  It is doubtful that  she has hyperthyroidism.  Within the next several months we will try to  repeat her thyroid ultrasound regarding history of nodule.   In terms of her lipids, she would like to continue lifestyle changes and  we will try taking over-the-counter fish oil 2-3 g a day and also repeat  lipids in 2 or 3 months.   In terms of her hot flashes, we discussed the pros and cons of hormone  replacement therapy and she will be started on Premarin 0.3 mg p.o. once  a day.  She may need to have this titrated upward based on response.  Follow-up time is in approximately 6  weeks.     Barbette Hair. Artist Pais, DO  Electronically Signed    RDY/MedQ  DD: 11/20/2006  DT: 11/20/2006  Job #: [id number]

## 2011-01-02 ENCOUNTER — Telehealth: Payer: Self-pay

## 2011-01-02 MED ORDER — FLUCONAZOLE 150 MG PO TABS: ORAL_TABLET | ORAL | Status: AC

## 2011-01-02 NOTE — Telephone Encounter (Signed)
Pt advised.

## 2011-01-02 NOTE — Telephone Encounter (Signed)
Pt called stating that she has been on ABX recently and now has a yeast infection. Pt is requesting Rx for Diflucan.

## 2011-01-02 NOTE — Telephone Encounter (Signed)
Done per emr 

## 2011-02-25 ENCOUNTER — Other Ambulatory Visit: Payer: PRIVATE HEALTH INSURANCE

## 2011-02-26 ENCOUNTER — Other Ambulatory Visit: Payer: Self-pay | Admitting: Obstetrics and Gynecology

## 2011-02-26 DIAGNOSIS — Z1231 Encounter for screening mammogram for malignant neoplasm of breast: Secondary | ICD-10-CM

## 2011-03-04 ENCOUNTER — Encounter: Payer: PRIVATE HEALTH INSURANCE | Admitting: Internal Medicine

## 2011-03-18 ENCOUNTER — Other Ambulatory Visit (INDEPENDENT_AMBULATORY_CARE_PROVIDER_SITE_OTHER): Payer: Commercial Managed Care - PPO

## 2011-03-18 DIAGNOSIS — IMO0001 Reserved for inherently not codable concepts without codable children: Secondary | ICD-10-CM

## 2011-03-18 DIAGNOSIS — Z0000 Encounter for general adult medical examination without abnormal findings: Secondary | ICD-10-CM

## 2011-03-18 LAB — BASIC METABOLIC PANEL
BUN: 14 mg/dL (ref 6–23)
CO2: 26 mEq/L (ref 19–32)
Calcium: 8.5 mg/dL (ref 8.4–10.5)
Chloride: 100 mEq/L (ref 96–112)
Creatinine, Ser: 0.8 mg/dL (ref 0.4–1.2)
Glucose, Bld: 101 mg/dL — ABNORMAL HIGH (ref 70–99)

## 2011-03-18 LAB — HEPATIC FUNCTION PANEL
AST: 24 U/L (ref 0–37)
Albumin: 4.1 g/dL (ref 3.5–5.2)
Alkaline Phosphatase: 78 U/L (ref 39–117)
Bilirubin, Direct: 0.1 mg/dL (ref 0.0–0.3)

## 2011-03-18 LAB — URINALYSIS, ROUTINE W REFLEX MICROSCOPIC
Ketones, ur: NEGATIVE
Leukocytes, UA: NEGATIVE
Specific Gravity, Urine: 1.015 (ref 1.000–1.030)
Urine Glucose: NEGATIVE
pH: 6 (ref 5.0–8.0)

## 2011-03-18 LAB — CBC WITH DIFFERENTIAL/PLATELET
Basophils Absolute: 0 10*3/uL (ref 0.0–0.1)
Eosinophils Absolute: 0.2 10*3/uL (ref 0.0–0.7)
Hemoglobin: 13.4 g/dL (ref 12.0–15.0)
Lymphocytes Relative: 33.5 % (ref 12.0–46.0)
MCHC: 33.6 g/dL (ref 30.0–36.0)
Monocytes Relative: 9.5 % (ref 3.0–12.0)
Neutro Abs: 3.6 10*3/uL (ref 1.4–7.7)
Neutrophils Relative %: 54.2 % (ref 43.0–77.0)
RBC: 4.5 Mil/uL (ref 3.87–5.11)
RDW: 13.9 % (ref 11.5–14.6)

## 2011-03-18 LAB — LIPID PANEL
LDL Cholesterol: 91 mg/dL (ref 0–99)
Total CHOL/HDL Ratio: 3

## 2011-03-18 LAB — TSH: TSH: 1.64 u[IU]/mL (ref 0.35–5.50)

## 2011-03-25 ENCOUNTER — Encounter: Payer: Self-pay | Admitting: Internal Medicine

## 2011-03-25 ENCOUNTER — Ambulatory Visit (INDEPENDENT_AMBULATORY_CARE_PROVIDER_SITE_OTHER): Payer: Commercial Managed Care - PPO | Admitting: Internal Medicine

## 2011-03-25 VITALS — BP 118/80 | HR 86 | Temp 97.7°F | Ht 62.0 in | Wt 180.1 lb

## 2011-03-25 DIAGNOSIS — IMO0001 Reserved for inherently not codable concepts without codable children: Secondary | ICD-10-CM

## 2011-03-25 DIAGNOSIS — R220 Localized swelling, mass and lump, head: Secondary | ICD-10-CM

## 2011-03-25 DIAGNOSIS — R221 Localized swelling, mass and lump, neck: Secondary | ICD-10-CM

## 2011-03-25 DIAGNOSIS — Z0000 Encounter for general adult medical examination without abnormal findings: Secondary | ICD-10-CM

## 2011-03-25 MED ORDER — ASPIRIN EC 81 MG PO TBEC: 81.0000 mg | DELAYED_RELEASE_TABLET | Freq: Every day | ORAL | Status: AC

## 2011-03-25 MED ORDER — METFORMIN HCL 500 MG PO TABS: 500.0000 mg | ORAL_TABLET | Freq: Every day | ORAL | Status: DC

## 2011-03-25 MED ORDER — ROSUVASTATIN CALCIUM 20 MG PO TABS: 20.0000 mg | ORAL_TABLET | Freq: Every day | ORAL | Status: DC

## 2011-03-25 NOTE — Assessment & Plan Note (Signed)
Overall doing well, age appropriate education and counseling updated, referrals for preventative services and immunizations addressed, dietary and smoking counseling addressed, most recent labs and ECG reviewed.  I have personally reviewed and have noted: 1) the patient's medical and social history 2) The pt's use of alcohol, tobacco, and illicit drugs 3) The patient's current medications and supplements 4) Functional ability including ADL's, fall risk, home safety risk, hearing and visual impairment 5) Diet and physical activities 6) Evidence for depression or mood disorder 7) The patient's height, weight, and BMI have been recorded in the chart I have made referrals, and provided counseling and education based on review of the above Has appt for GYN later this month

## 2011-03-25 NOTE — Assessment & Plan Note (Signed)
CT and ENT eval may 2012 negative except for 7 mm nodule, not palpable today,  to f/u any worsening symptoms or concerns

## 2011-03-25 NOTE — Progress Notes (Signed)
Subjective:    Patient ID: [name], female    DOB: [birth year], 55 y.o.   MRN: [id number]  HPI Here for wellness and f/u;  Overall doing ok;  Pt denies CP, worsening SOB, DOE, wheezing, orthopnea, PND, worsening LE edema, palpitations, dizziness or syncope.  Pt denies neurological change such as new Headache, facial or extremity weakness.  Pt denies polydipsia, polyuria, or low sugar symptoms. Pt states overall good compliance with treatment and medications, good tolerability, and trying to follow lower cholesterol diet.  Pt denies worsening depressive symptoms, suicidal ideation or panic. No fever, wt loss, night sweats, loss of appetite, or other constitutional symptoms.  Pt states good ability with ADL's, low fall risk, home safety reviewed and adequate, no significant changes in hearing or vision, and occasionally active with exercise, less recently and gained wt, but did have the exercise tolerance test, with activities now limited to casual walking.   Only taking the crestor about 4 days per wk, as for some reason she tends to forget.  Takes the metformin daily in the am. Past Medical History  Diagnosis Date  . Hyperlipidemia   . Multinodular thyroid   . GERD (gastroesophageal reflux disease)   . DJD (degenerative joint disease)   . Hx of hysterectomy   . Palpitation   . Aortic stenosis   . Heart murmur   . Anxiety     for hot flashes  . Diabetes mellitus    Past Surgical History  Procedure Date  . Cesarean section   . Bunionectomy   . Partial hysterectomy     reports that she has quit smoking. She does not have any smokeless tobacco history on file. She reports that she does not drink alcohol. Her drug history not on file. family history includes COPD in her father; Cancer (age of onset:72) in her mother; Coronary artery disease in her father; Diabetes in her mother; and Hypertension in her other. Allergies  Allergen Reactions  . Atorvastatin     REACTION: myalgias Lipitor   . Codeine     REACTION: N/V  . Niacin     REACTION: flushing   No current outpatient prescriptions on file prior to visit.   Review of Systems Review of Systems  Constitutional: Negative for diaphoresis, activity change, appetite change and unexpected weight change.  HENT: Negative for hearing loss, ear pain, facial swelling, mouth sores and neck stiffness.   Eyes: Negative for pain, redness and visual disturbance.  Respiratory: Negative for shortness of breath and wheezing.   Cardiovascular: Negative for chest pain and palpitations.  Gastrointestinal: Negative for diarrhea, blood in stool, abdominal distention and rectal pain.  Genitourinary: Negative for hematuria, flank pain and decreased urine volume.  Musculoskeletal: Negative for myalgias and joint swelling.  Skin: Negative for color change and wound.  Neurological: Negative for syncope and numbness.  Hematological: Negative for adenopathy.  Psychiatric/Behavioral: Negative for hallucinations, self-injury, decreased concentration and agitation.      Objective:   Physical Exam BP 118/80  Pulse 86  Temp(Src) 97.7 F (36.5 C) (Oral)  Ht 5\' 2"  (1.575 m)  Wt 180 lb 2 oz (81.704 kg)  BMI 32.95 kg/m2  SpO2 96% Physical Exam  VS noted, obese Constitutional: Pt is oriented to person, place, and time. Appears well-developed and well-nourished.  HENT:  Head: Normocephalic and atraumatic.  Right Ear: External ear normal.  Left Ear: External ear normal.  Nose: Nose normal.  Mouth/Throat: Oropharynx is clear and moist.  Eyes: Conjunctivae and EOM  are normal. Pupils are equal, round, and reactive to light.  Neck: Normal range of motion. Neck supple. No JVD present. No tracheal deviation present. No mass noted Cardiovascular: Normal rate, regular rhythm, normal heart sounds and intact distal pulses.   Pulmonary/Chest: Effort normal and breath sounds normal.  Abdominal: Soft. Bowel sounds are normal. There is no tenderness.    Musculoskeletal: Normal range of motion. Exhibits no edema.  Lymphadenopathy:  Has no cervical adenopathy.  Neurological: Pt is alert and oriented to person, place, and time. Pt has normal reflexes. No cranial nerve deficit.  Skin: Skin is warm and dry. No rash noted.  Psychiatric:  Has  normal mood and affect. Behavior is normal.         Assessment & Plan:

## 2011-03-25 NOTE — Patient Instructions (Signed)
Continue all other medications as before Please take your crestor every day, as your goal for the LDL is less than 70 Please start Aspirin dailiy- 81 mg - 1 per day - COATED only, to help reduce risk of heart attack and stroke Please return in 6 mo with Lab testing done 3-5 days before

## 2011-03-25 NOTE — Assessment & Plan Note (Signed)
stable overall by hx and exam, most recent data reviewed with pt, and pt to continue medical treatment as before  Lab Results  Component Value Date   HGBA1C 6.7* 03/18/2011

## 2011-04-08 ENCOUNTER — Ambulatory Visit
Admission: RE | Admit: 2011-04-08 | Discharge: 2011-04-08 | Disposition: A | Discharge status: Home / Self Care | Payer: Commercial Managed Care - PPO | Source: Ambulatory Visit | Attending: Obstetrics and Gynecology | Admitting: Obstetrics and Gynecology

## 2011-04-08 DIAGNOSIS — Z1231 Encounter for screening mammogram for malignant neoplasm of breast: Secondary | ICD-10-CM

## 2011-04-23 ENCOUNTER — Other Ambulatory Visit: Payer: Self-pay | Admitting: Internal Medicine

## 2011-06-06 ENCOUNTER — Encounter: Payer: Self-pay | Admitting: Cardiovascular Disease

## 2011-06-06 ENCOUNTER — Ambulatory Visit (HOSPITAL_COMMUNITY): Payer: Commercial Managed Care - PPO | Attending: Cardiovascular Disease

## 2011-06-06 ENCOUNTER — Ambulatory Visit (INDEPENDENT_AMBULATORY_CARE_PROVIDER_SITE_OTHER): Payer: Commercial Managed Care - PPO | Admitting: Cardiovascular Disease

## 2011-06-06 VITALS — BP 128/80 | HR 80 | Resp 18 | Ht 62.0 in | Wt 183.8 lb

## 2011-06-06 DIAGNOSIS — E785 Hyperlipidemia, unspecified: Secondary | ICD-10-CM | POA: Insufficient documentation

## 2011-06-06 DIAGNOSIS — I359 Nonrheumatic aortic valve disorder, unspecified: Secondary | ICD-10-CM

## 2011-06-06 DIAGNOSIS — I080 Rheumatic disorders of both mitral and aortic valves: Secondary | ICD-10-CM | POA: Insufficient documentation

## 2011-06-06 DIAGNOSIS — I350 Nonrheumatic aortic (valve) stenosis: Secondary | ICD-10-CM

## 2011-06-06 DIAGNOSIS — Q231 Congenital insufficiency of aortic valve: Secondary | ICD-10-CM | POA: Insufficient documentation

## 2011-06-06 DIAGNOSIS — I079 Rheumatic tricuspid valve disease, unspecified: Secondary | ICD-10-CM | POA: Insufficient documentation

## 2011-06-06 DIAGNOSIS — R42 Dizziness and giddiness: Secondary | ICD-10-CM | POA: Insufficient documentation

## 2011-06-06 LAB — PROTIME-INR: INR: 0.9 ratio (ref 0.8–1.0)

## 2011-06-06 LAB — CBC WITH DIFFERENTIAL/PLATELET
Basophils Relative: 0.6 % (ref 0.0–3.0)
Eosinophils Absolute: 0.2 10*3/uL (ref 0.0–0.7)
Eosinophils Relative: 1.9 % (ref 0.0–5.0)
HCT: 40.7 % (ref 36.0–46.0)
Hemoglobin: 13.9 g/dL (ref 12.0–15.0)
MCHC: 34.2 g/dL (ref 30.0–36.0)
MCV: 88.5 fl (ref 78.0–100.0)
Monocytes Absolute: 1 10*3/uL (ref 0.1–1.0)
Neutro Abs: 5.3 10*3/uL (ref 1.4–7.7)
Neutrophils Relative %: 53.7 % (ref 43.0–77.0)
RBC: 4.6 Mil/uL (ref 3.87–5.11)
WBC: 9.8 10*3/uL (ref 4.5–10.5)

## 2011-06-06 LAB — BASIC METABOLIC PANEL
CO2: 29 mEq/L (ref 19–32)
Chloride: 103 mEq/L (ref 96–112)
Creatinine, Ser: 0.8 mg/dL (ref 0.4–1.2)
Potassium: 4 mEq/L (ref 3.5–5.1)
Sodium: 140 mEq/L (ref 135–145)

## 2011-06-06 NOTE — Progress Notes (Signed)
HPI:  This is a 55 year old woman presenting for followup of aortic stenosis. She continues to have dyspnea with moderate level exertion. She has attributed this to being "out of shape." However, at higher level exertion the patient notes tightness across her chest and lightheadedness. This is been stable now for some time and occurs with activity such as walking up a hill. She underwent an exercise treadmill study last year that showed fair exercise tolerance without ischemic ST changes. Her echocardiogram one year ago showed findings consistent with moderate aortic stenosis. However today's echocardiogram shows clear progression based on high velocities across the aortic valve (greater than 4 m/s).  The patient denies palpitations or syncope. She's had no edema, orthopnea, or PND. She reports no recent changes in her medications.  Outpatient Encounter Prescriptions as of 06/06/2011  Medication Sig Dispense Refill  . aspirin EC 81 MG tablet Take 1 tablet (81 mg total) by mouth daily.  150 tablet  2  . metFORMIN (GLUCOPHAGE) 500 MG tablet Take 1 tablet (500 mg total) by mouth daily with breakfast.  90 tablet  3  . rosuvastatin (CRESTOR) 20 MG tablet Take 1 tablet (20 mg total) by mouth daily.  90 tablet  3    Allergies  Allergen Reactions  . Atorvastatin     REACTION: myalgias Lipitor  . Codeine     REACTION: N/V  . Niacin     REACTION: flushing    Past Medical History  Diagnosis Date  . Hyperlipidemia   . Multinodular thyroid   . GERD (gastroesophageal reflux disease)   . DJD (degenerative joint disease)   . Hx of hysterectomy   . Palpitation   . Aortic stenosis   . Heart murmur   . Anxiety     for hot flashes  . Diabetes mellitus     ROS: Negative except as per HPI  BP 128/80  Pulse 80  Resp 18  Ht 5\' 2"  (1.575 m)  Wt 183 lb 12.8 oz (83.371 kg)  BMI 33.62 kg/m2  PHYSICAL EXAM: Pt is alert and oriented, NAD HEENT: normal Neck: JVP - normal, carotids 2+= without  bruits Lungs: CTA bilaterally CV: RRR with grade 3/6 harsh systolic murmur best heard at the right upper sternal border. Abd: soft, NT, Positive BS, no hepatomegaly Ext: no C/C/E, distal pulses intact and equal Skin: warm/dry no rash  EKG:  Normal sinus rhythm 80 beats per minute, cannot rule out age-indeterminate inferior infarct, ST and T wave abnormality consider lateral ischemia.  ASSESSMENT AND PLAN:

## 2011-06-06 NOTE — Patient Instructions (Signed)
Your physician recommends that you continue on your current medications as directed. Please refer to the Current Medication list given to you today.  Your physician has requested that you have a cardiac catheterization. Cardiac catheterization is used to diagnose and/or treat various heart conditions. Doctors may recommend this procedure for a number of different reasons. The most common reason is to evaluate chest pain. Chest pain can be a symptom of coronary artery disease (CAD), and cardiac catheterization can show whether plaque is narrowing or blocking your heart's arteries. This procedure is also used to evaluate the valves, as well as measure the blood flow and oxygen levels in different parts of your heart. For further information please visit [email or web address] Please follow instruction sheet, as given.  Your physician recommends that you have lab work today: CBC, BMP, PT/INR

## 2011-06-09 ENCOUNTER — Encounter: Payer: Self-pay | Admitting: Cardiovascular Disease

## 2011-06-09 NOTE — Assessment & Plan Note (Signed)
The patient's echo images were reviewed. Her velocities are much greater than nose one year ago. The aortic valve cusps are not well seen and there is not a great deal of calcium on the valve. Considering her symptoms with exertion and echo findings consistent with severe aortic stenosis, I have recommended a right and left cardiac catheterization. I have reviewed the risks, indications, and alternatives with the patient. Risks discussed included vascular injury, bleeding, stroke, myocardial infarction, and death. She understands these risks are low and agrees to proceed. If the catheterization study confirms severe aortic stenosis she will be referred for consideration of aortic valve replacement.

## 2011-06-13 ENCOUNTER — Inpatient Hospital Stay (HOSPITAL_BASED_OUTPATIENT_CLINIC_OR_DEPARTMENT_OTHER): Admit: 2011-06-13 | Payer: Self-pay | Admitting: Cardiovascular Disease

## 2011-06-13 ENCOUNTER — Encounter (HOSPITAL_BASED_OUTPATIENT_CLINIC_OR_DEPARTMENT_OTHER): Payer: Self-pay

## 2011-06-13 ENCOUNTER — Inpatient Hospital Stay (HOSPITAL_BASED_OUTPATIENT_CLINIC_OR_DEPARTMENT_OTHER)
Admission: RE | Admit: 2011-06-13 | Discharge: 2011-06-13 | Disposition: A | Discharge status: Home / Self Care | Payer: Commercial Managed Care - PPO | Source: Ambulatory Visit | Attending: Cardiovascular Disease | Admitting: Cardiovascular Disease

## 2011-06-13 DIAGNOSIS — R9389 Abnormal findings on diagnostic imaging of other specified body structures: Secondary | ICD-10-CM | POA: Insufficient documentation

## 2011-06-13 DIAGNOSIS — I359 Nonrheumatic aortic valve disorder, unspecified: Secondary | ICD-10-CM

## 2011-06-13 DIAGNOSIS — R42 Dizziness and giddiness: Secondary | ICD-10-CM | POA: Insufficient documentation

## 2011-06-13 DIAGNOSIS — Q231 Congenital insufficiency of aortic valve: Secondary | ICD-10-CM | POA: Insufficient documentation

## 2011-06-13 DIAGNOSIS — Q245 Malformation of coronary vessels: Secondary | ICD-10-CM | POA: Insufficient documentation

## 2011-06-13 DIAGNOSIS — R0989 Other specified symptoms and signs involving the circulatory and respiratory systems: Secondary | ICD-10-CM | POA: Insufficient documentation

## 2011-06-13 DIAGNOSIS — R0609 Other forms of dyspnea: Secondary | ICD-10-CM | POA: Insufficient documentation

## 2011-06-13 LAB — POCT I-STAT 3, VENOUS BLOOD GAS (G3P V)
Acid-base deficit: 1 mmol/L (ref 0.0–2.0)
Bicarbonate: 23.8 mEq/L (ref 20.0–24.0)
TCO2: 25 mmol/L (ref 0–100)

## 2011-06-13 LAB — POCT I-STAT 3, ART BLOOD GAS (G3+)
Bicarbonate: 23.5 mEq/L (ref 20.0–24.0)
TCO2: 25 mmol/L (ref 0–100)
pO2, Arterial: 79 mmHg — ABNORMAL LOW (ref 80.0–100.0)

## 2011-06-13 SURGERY — JV LEFT HEART CATHETERIZATION WITH CORONARY ANGIOGRAM
Anesthesia: Moderate Sedation

## 2011-06-19 ENCOUNTER — Other Ambulatory Visit: Payer: Self-pay | Admitting: Cardiothoracic Surgery

## 2011-06-19 ENCOUNTER — Institutional Professional Consult (permissible substitution) (INDEPENDENT_AMBULATORY_CARE_PROVIDER_SITE_OTHER): Payer: Commercial Managed Care - PPO | Admitting: Cardiothoracic Surgery

## 2011-06-19 ENCOUNTER — Encounter: Payer: Self-pay | Admitting: Cardiothoracic Surgery

## 2011-06-19 VITALS — BP 147/95 | HR 110 | Ht 62.0 in | Wt 183.0 lb

## 2011-06-19 DIAGNOSIS — I359 Nonrheumatic aortic valve disorder, unspecified: Secondary | ICD-10-CM

## 2011-06-19 DIAGNOSIS — E119 Type 2 diabetes mellitus without complications: Secondary | ICD-10-CM

## 2011-06-19 NOTE — Patient Instructions (Signed)
Stop metformin 2 days before surgery

## 2011-06-19 NOTE — Progress Notes (Signed)
PCP is Oliver Barre, MD, MD Referring Provider is Micheline Chapman, MD                                         [address]            [name] [address]          [phone]     Chief Complaint  Patient presents with  . Aortic Stenosis    Referral from Dr Excell Seltzer for surgical eval, Aortic Senosis, cath'ed 06/13/11      HPI: The patient is a 55 year old Caucasian female who presents with a diagnosis of severe aortic stenosis from a probable bicuspid aortic valve. The patient has had a cardiac murmur since childhood. She has been followed with serial 2-D echoes by Dr. Tonny Bollman. Her symptoms have been very minimal until recently when she developed dyspnea on exertion, decreased exercise tolerance, and some exertional chest tightness. She especially had difficulty climbing up the stairs at a football game recently and felt she was about to pass out. Her most recent 2-D echo by Dr. Excell Seltzer showed a significant increase in transvalvular gradient to a mean of 50 mm mercury. The valve appeared to be bicuspid. There is minimal aortic insufficiency or other valvular abnormalities. There is LVH with EF of 55%. The LV outflow tract measure 20 mmHg.  The patient denies history of rheumatic heart disease but did minutes 6 months of school in the first grade due to "pneumonia" while she lived in the [place] She denies any active dental complaints and sees a dentist twice a year. She denies any symptoms of endocarditis. She had no symptoms of heart failure during her pregnancy for her 37 year old daughter. She denies any resting symptoms.  Patient underwent a cardiac catheterization by Dr. Excell Seltzer last week. This demonstrated severe aortic stenosis. Right heart pressures were PA pressure 30/10 with a wedge of 7. Left ventricular end-diastolic pressure 15. EF was 70. Coronaries were normal. Cardiac output 4.7 L per minute. The aortic valve was calcified 1+ AI. There is no significant  dilatation of the descending aorta. Valve area was 0.55 with a peak gradient of 68 mm mercury. She is referred here for discussion of aortic valve replacement. Past Medical History  Diagnosis Date  . Hyperlipidemia   . Multinodular thyroid   . GERD (gastroesophageal reflux disease)   . DJD (degenerative joint disease)   . Hx of hysterectomy   . Palpitation   . Aortic stenosis   . Heart murmur   . Anxiety     for hot flashes  . Diabetes mellitus     Past Surgical History  Procedure Date  . Cesarean section   . Bunionectomy   . Partial hysterectomy     Family History  Problem Relation Age of Onset  . Hypertension Other   . Coronary artery disease Father   . COPD Father   . Diabetes Mother   . Cancer Mother 59    bile duct cancer    Social History History  Substance Use Topics  . Smoking status: Former Smoker    Types: Cigarettes    Quit date: 08/12/1994  . Smokeless tobacco: Never Used  . Alcohol Use: No    Current Outpatient Prescriptions  Medication Sig Dispense Refill  . aspirin EC 81 MG tablet Take 1 tablet (81 mg total) by mouth daily.  150 tablet  2  . metFORMIN (GLUCOPHAGE) 500 MG tablet Take 1 tablet (500 mg total) by mouth daily with breakfast.  90 tablet  3  . rosuvastatin (CRESTOR) 20 MG tablet Take 1 tablet (20 mg total) by mouth daily.  90 tablet  3    Allergies  Allergen Reactions  . Atorvastatin     REACTION: myalgias Lipitor  . Codeine     REACTION: N/V  . Morphine And Related Nausea And Vomiting  . Niacin     REACTION: flushing  . Percocet (Oxycodone-Acetaminophen) Other (See Comments)    "blisters in mouth took in 1995"    Review of Systems  Constitutional review is negative for fever weight loss. HEENT review is negative for dental complaints or difficulty swallowing. Thoracic review is negative her history thoracic, pneumonia hemoptysis. Cardiac review is positive for a long history of cardiac murmur with recent symptoms of class III  CHF. GI review is negative her hepatitis or jaundice or blood per rectum. Endocrine review is positive her diabetes recently started on metformin. Vascular review is negative for DVT claudication or TIA. Neurologic review is negative her stroke or seizure. Hematologic views negative for bleeding disorder blood transfusion or contraindication to anticoagulation.  BP 147/95  Pulse 110  Ht 5\' 2"  (1.575 m)  Wt 183 lb (83.008 kg)  BMI 33.47 kg/m2  SpO2 98% Physical Exam GENERAL pleasant middle-aged female somewhat overweight no acute distress HEENT pupils equal dentition good X. clear. Neck without JVD or mass. Transmitted murmur of aortic stenosis to the neck. THORAX no deformity no tenderness breath sounds clear and equal bilaterally. CARDIAC regular rhythm grade 4/6 systolic ejection murmur right upper sternal border. No diastolic murmur. No gallop. GI obese soft nontender without pulsatile mass. EXTREMITY no clubbing cyanosis tenderness or edema. VASCULAR palpable pulses in all extremities. NEURO alert [place] without focal motor deficit. She is right-hand dominant. Diagnostic Tests:   Impression: Severe aortic stenosis symptomatic class III CHF with probable bicuspid aortic valve. No significant descending aortic abnormality. No other significant valvular disease or coronary artery disease.  Plan: The patient will be prepared for aortic valve replacement. We discussed the choice of a mechanical valve as the best option for her age of 57 years and bili did tolerate anticoagulation long-term. She understands the indications alternatives and risks of surgery. Chest is a risk of stroke bleeding blood transfusion requirement and possible death. She agrees to proceed with surgery as discussed. Aortic valve replacement is scheduled for November 13.

## 2011-06-20 ENCOUNTER — Encounter (HOSPITAL_COMMUNITY): Payer: Self-pay | Admitting: Pharmacy Technician

## 2011-06-20 ENCOUNTER — Other Ambulatory Visit: Payer: Self-pay

## 2011-06-20 DIAGNOSIS — I359 Nonrheumatic aortic valve disorder, unspecified: Secondary | ICD-10-CM

## 2011-06-20 DIAGNOSIS — I059 Rheumatic mitral valve disease, unspecified: Secondary | ICD-10-CM

## 2011-06-21 ENCOUNTER — Encounter (HOSPITAL_COMMUNITY)
Admission: RE | Admit: 2011-06-21 | Discharge: 2011-06-21 | Disposition: A | Discharge status: Home / Self Care | Payer: Commercial Managed Care - PPO | Source: Ambulatory Visit | Attending: Cardiothoracic Surgery | Admitting: Cardiothoracic Surgery

## 2011-06-21 ENCOUNTER — Encounter (HOSPITAL_COMMUNITY): Payer: Self-pay

## 2011-06-21 ENCOUNTER — Ambulatory Visit (HOSPITAL_COMMUNITY)
Admission: RE | Admit: 2011-06-21 | Discharge: 2011-06-21 | Disposition: A | Discharge status: Home / Self Care | Payer: Commercial Managed Care - PPO | Source: Ambulatory Visit | Attending: Cardiothoracic Surgery | Admitting: Cardiothoracic Surgery

## 2011-06-21 DIAGNOSIS — Z01810 Encounter for preprocedural cardiovascular examination: Secondary | ICD-10-CM

## 2011-06-21 DIAGNOSIS — I359 Nonrheumatic aortic valve disorder, unspecified: Secondary | ICD-10-CM | POA: Insufficient documentation

## 2011-06-21 DIAGNOSIS — Z01812 Encounter for preprocedural laboratory examination: Secondary | ICD-10-CM | POA: Insufficient documentation

## 2011-06-21 DIAGNOSIS — I2510 Atherosclerotic heart disease of native coronary artery without angina pectoris: Secondary | ICD-10-CM | POA: Insufficient documentation

## 2011-06-21 DIAGNOSIS — E785 Hyperlipidemia, unspecified: Secondary | ICD-10-CM | POA: Insufficient documentation

## 2011-06-21 HISTORY — DX: Headache: R51

## 2011-06-21 HISTORY — DX: Angina pectoris, unspecified: I20.9

## 2011-06-21 HISTORY — DX: Shortness of breath: R06.02

## 2011-06-21 LAB — COMPREHENSIVE METABOLIC PANEL
ALT: 11 U/L (ref 0–35)
AST: 18 U/L (ref 0–37)
Albumin: 3.6 g/dL (ref 3.5–5.2)
Alkaline Phosphatase: 97 U/L (ref 39–117)
BUN: 9 mg/dL (ref 6–23)
CO2: 19 mEq/L (ref 19–32)
Calcium: 9.7 mg/dL (ref 8.4–10.5)
Chloride: 105 mEq/L (ref 96–112)
Creatinine, Ser: 0.66 mg/dL (ref 0.50–1.10)
GFR calc Af Amer: >90 mL/min (ref >90)
GFR calc non Af Amer: >90 mL/min (ref >90)
Glucose, Bld: 160 mg/dL — ABNORMAL HIGH (ref 70–99)
Potassium: 4.2 mEq/L (ref 3.5–5.1)
Sodium: 139 mEq/L (ref 135–145)
Total Bilirubin: 0.2 mg/dL — ABNORMAL LOW (ref 0.3–1.2)
Total Protein: 7.4 g/dL (ref 6.0–8.3)

## 2011-06-21 LAB — HEMOGLOBIN A1C
Hgb A1c MFr Bld: 7.1 % — ABNORMAL HIGH (ref <5.7)
Mean Plasma Glucose: 157 mg/dL — ABNORMAL HIGH (ref <117)

## 2011-06-21 LAB — CBC
HCT: 39.2 % (ref 36.0–46.0)
Hemoglobin: 13.4 g/dL (ref 12.0–15.0)
MCH: 29.7 pg (ref 26.0–34.0)
MCHC: 34.2 g/dL (ref 30.0–36.0)
MCV: 86.9 fL (ref 78.0–100.0)
Platelets: 287 10*3/uL (ref 150–400)
RBC: 4.51 MIL/uL (ref 3.87–5.11)
RDW: 13.2 % (ref 11.5–15.5)
WBC: 9.9 10*3/uL (ref 4.0–10.5)

## 2011-06-21 LAB — BLOOD GAS, ARTERIAL
Acid-base deficit: 0.9 mmol/L (ref 0.0–2.0)
Bicarbonate: 22.4 mEq/L (ref 20.0–24.0)
Drawn by: 344381
FIO2: 0.21 %
O2 Saturation: 98.6 %
Patient temperature: 98.6
TCO2: 23.4 mmol/L (ref 0–100)
pCO2 arterial: 32.2 mmHg — ABNORMAL LOW (ref 35.0–45.0)
pH, Arterial: 7.457 — ABNORMAL HIGH (ref 7.350–7.400)
pO2, Arterial: 100 mmHg (ref 80.0–100.0)

## 2011-06-21 LAB — SURGICAL PCR SCREEN
MRSA, PCR: NEGATIVE
Staphylococcus aureus: NEGATIVE

## 2011-06-21 LAB — URINALYSIS, ROUTINE W REFLEX MICROSCOPIC
Bilirubin Urine: NEGATIVE
Glucose, UA: 500 mg/dL — AB
Hgb urine dipstick: NEGATIVE
Ketones, ur: NEGATIVE mg/dL
Leukocytes, UA: NEGATIVE
Nitrite: NEGATIVE
Protein, ur: NEGATIVE mg/dL
Specific Gravity, Urine: 1.013 (ref 1.005–1.030)
Urobilinogen, UA: 0.2 mg/dL (ref 0.0–1.0)
pH: 5.5 (ref 5.0–8.0)

## 2011-06-21 LAB — PULMONARY FUNCTION TEST

## 2011-06-21 LAB — APTT: aPTT: 29 seconds (ref 24–37)

## 2011-06-21 LAB — ABO/RH: ABO/RH(D): A POS

## 2011-06-21 LAB — PROTIME-INR
INR: 0.92 (ref 0.00–1.49)
Prothrombin Time: 12.6 seconds (ref 11.6–15.2)

## 2011-06-21 MED ORDER — CHLORHEXIDINE GLUCONATE 4 % EX LIQD: 30.0000 mL | CUTANEOUS | Status: DC

## 2011-06-21 NOTE — Progress Notes (Signed)
*  PRELIMINARY RESULTS*  Pre-CABG  has been performed. Preliminary- No significant ICA stenosis bilaterally. Right vertebral artery flow is atypical which is consistent with pre-steal state. Highly suggestive of innominate stenosis. There is a 11 mmHg pressure gradient difference from the right and left brachial pressures. The Left vertebral artery is patent with antegrade flow. Palmer Arch Evaluation - doppler waveforms decrease greater than 50% with ulnar compression on the right and remain normal with ulnar and radial compressions on the left.  Mila Homer 06/21/2011, 11:13 AM

## 2011-06-21 NOTE — Pre-Procedure Instructions (Signed)
20 [name]  06/21/2011   Your procedure is scheduled on:  06-25-2011 Tuesday  Report to [facility] at 5:30 AM.  Call this number if you have problems the morning of surgery: [phone]   Remember:   Do not eat food:After Midnight.  Do not drink clear liquids: 4 Hours before arrival.  Take these medicines the morning of surgery with A SIP OF WATER: aspirin   Do not wear jewelry, make-up or nail polish.  Do not wear lotions, powders, or perfumes. You may wear deodorant.  Do not shave 48 hours prior to surgery.  Do not bring valuables to the hospital.  Contacts, dentures or bridgework may not be worn into surgery.  Leave suitcase in the car. After surgery it may be brought to your room.  For patients admitted to the hospital, checkout time is 11:00 AM the day of discharge.   Patients discharged the day of surgery will not be allowed to drive home.  Name and phone number of your driver: laurella tull - husband  [phone]  Special Instructions: Incentive Spirometry - Practice and bring it with you on the day of surgery. and CHG Shower Use Special Wash: 1/2 bottle night before surgery and 1/2 bottle morning of surgery.   Please read over the following fact sheets that you were given: Pain Booklet, Coughing and Deep Breathing, Blood Transfusion Information, Open Heart Packet, MRSA Information and Surgical Site Infection Prevention

## 2011-06-24 MED ORDER — SODIUM CHLORIDE 0.9 % IV SOLN
INTRAVENOUS | Status: DC
  Filled 2011-06-24: qty 1

## 2011-06-24 MED ORDER — PHENYLEPHRINE HCL 10 MG/ML IJ SOLN
30.0000 ug/min | INTRAVENOUS | Status: DC
  Filled 2011-06-24: qty 2

## 2011-06-24 MED ORDER — VANCOMYCIN HCL 1000 MG IV SOLR
1250.0000 mg | INTRAVENOUS | Status: DC
  Filled 2011-06-24 (×2): qty 1250

## 2011-06-24 MED ORDER — METOPROLOL TARTRATE 12.5 MG HALF TABLET
12.5000 mg | ORAL_TABLET | Freq: Once | ORAL | Status: AC
  Administered 2011-06-25: 12.5 mg via ORAL

## 2011-06-24 MED ORDER — POTASSIUM CHLORIDE 2 MEQ/ML IV SOLN
80.0000 meq | INTRAVENOUS | Status: DC
  Filled 2011-06-24: qty 40

## 2011-06-24 MED ORDER — NITROGLYCERIN IN D5W 200-5 MCG/ML-% IV SOLN
2.0000 ug/min | INTRAVENOUS | Status: DC
  Filled 2011-06-24: qty 250

## 2011-06-24 MED ORDER — DEXTROSE 5 % IV SOLN
750.0000 mg | INTRAVENOUS | Status: DC
  Filled 2011-06-24: qty 750

## 2011-06-24 MED ORDER — CEFUROXIME SODIUM 1.5 G IJ SOLR
1.5000 g | INTRAMUSCULAR | Status: DC
  Filled 2011-06-24 (×2): qty 1.5

## 2011-06-24 MED ORDER — SODIUM CHLORIDE 0.9 % IV SOLN
INTRAVENOUS | Status: DC
  Filled 2011-06-24: qty 40

## 2011-06-24 MED ORDER — MAGNESIUM SULFATE 50 % IJ SOLN
40.0000 meq | INTRAMUSCULAR | Status: DC
  Filled 2011-06-24: qty 10

## 2011-06-24 MED ORDER — EPINEPHRINE HCL 1 MG/ML IJ SOLN
0.5000 ug/min | INTRAVENOUS | Status: DC
  Filled 2011-06-24: qty 4

## 2011-06-24 MED ORDER — DOPAMINE-DEXTROSE 3.2-5 MG/ML-% IV SOLN
2.0000 ug/kg/min | INTRAVENOUS | Status: DC
  Filled 2011-06-24: qty 250

## 2011-06-24 MED ORDER — SODIUM CHLORIDE 0.9 % IV SOLN
0.1000 ug/kg/h | INTRAVENOUS | Status: DC
  Filled 2011-06-24: qty 4

## 2011-06-24 MED ORDER — PLASMA-LYTE 148 IV SOLN
INTRAVENOUS | Status: AC
  Administered 2011-06-25: 09:00:00
  Filled 2011-06-24: qty 0.5

## 2011-06-25 ENCOUNTER — Other Ambulatory Visit: Payer: Self-pay

## 2011-06-25 ENCOUNTER — Other Ambulatory Visit: Payer: Self-pay | Admitting: Cardiothoracic Surgery

## 2011-06-25 ENCOUNTER — Encounter (HOSPITAL_COMMUNITY): Payer: Self-pay | Admitting: Anesthesiology

## 2011-06-25 ENCOUNTER — Encounter (HOSPITAL_COMMUNITY): Payer: Self-pay | Admitting: *Deleted

## 2011-06-25 ENCOUNTER — Inpatient Hospital Stay (HOSPITAL_COMMUNITY): Payer: Commercial Managed Care - PPO | Admitting: Anesthesiology

## 2011-06-25 ENCOUNTER — Inpatient Hospital Stay (HOSPITAL_COMMUNITY): Payer: Commercial Managed Care - PPO

## 2011-06-25 ENCOUNTER — Encounter (HOSPITAL_COMMUNITY)
Admission: RE | Disposition: A | Discharge status: Home / Self Care | Payer: Self-pay | Source: Ambulatory Visit | Attending: Cardiothoracic Surgery

## 2011-06-25 ENCOUNTER — Inpatient Hospital Stay (HOSPITAL_COMMUNITY)
Admission: RE | Admit: 2011-06-25 | Discharge: 2011-07-01 | DRG: 220 | Disposition: A | Discharge status: Home / Self Care | Payer: Commercial Managed Care - PPO | Source: Ambulatory Visit | Attending: Cardiothoracic Surgery | Admitting: Cardiothoracic Surgery

## 2011-06-25 DIAGNOSIS — E785 Hyperlipidemia, unspecified: Secondary | ICD-10-CM | POA: Insufficient documentation

## 2011-06-25 DIAGNOSIS — K219 Gastro-esophageal reflux disease without esophagitis: Secondary | ICD-10-CM | POA: Insufficient documentation

## 2011-06-25 DIAGNOSIS — I359 Nonrheumatic aortic valve disorder, unspecified: Secondary | ICD-10-CM

## 2011-06-25 DIAGNOSIS — D62 Acute posthemorrhagic anemia: Secondary | ICD-10-CM | POA: Diagnosis not present

## 2011-06-25 DIAGNOSIS — Z7982 Long term (current) use of aspirin: Secondary | ICD-10-CM

## 2011-06-25 DIAGNOSIS — F411 Generalized anxiety disorder: Secondary | ICD-10-CM | POA: Diagnosis present

## 2011-06-25 DIAGNOSIS — E042 Nontoxic multinodular goiter: Secondary | ICD-10-CM | POA: Insufficient documentation

## 2011-06-25 DIAGNOSIS — Q231 Congenital insufficiency of aortic valve: Principal | ICD-10-CM

## 2011-06-25 DIAGNOSIS — E119 Type 2 diabetes mellitus without complications: Secondary | ICD-10-CM | POA: Diagnosis present

## 2011-06-25 DIAGNOSIS — I509 Heart failure, unspecified: Secondary | ICD-10-CM | POA: Diagnosis present

## 2011-06-25 DIAGNOSIS — I517 Cardiomegaly: Secondary | ICD-10-CM | POA: Diagnosis present

## 2011-06-25 DIAGNOSIS — Z952 Presence of prosthetic heart valve: Secondary | ICD-10-CM

## 2011-06-25 DIAGNOSIS — M1990 Unspecified osteoarthritis, unspecified site: Secondary | ICD-10-CM | POA: Diagnosis present

## 2011-06-25 HISTORY — PX: AORTIC VALVE REPLACEMENT: SHX41

## 2011-06-25 LAB — GLUCOSE, CAPILLARY
Glucose-Capillary: 136 mg/dL — ABNORMAL HIGH (ref 70–99)
Glucose-Capillary: 95 mg/dL (ref 70–99)
Glucose-Capillary: 118 mg/dL — ABNORMAL HIGH (ref 70–99)
Glucose-Capillary: 118 mg/dL — ABNORMAL HIGH (ref 70–99)
Glucose-Capillary: 108 mg/dL — ABNORMAL HIGH (ref 70–99)
Glucose-Capillary: 121 mg/dL — ABNORMAL HIGH (ref 70–99)
Glucose-Capillary: 107 mg/dL — ABNORMAL HIGH (ref 70–99)
Glucose-Capillary: 124 mg/dL — ABNORMAL HIGH (ref 70–99)
Glucose-Capillary: 132 mg/dL — ABNORMAL HIGH (ref 70–99)

## 2011-06-25 LAB — POCT I-STAT 4, (NA,K, GLUC, HGB,HCT)
Glucose, Bld: 154 mg/dL — ABNORMAL HIGH (ref 70–99)
Glucose, Bld: 125 mg/dL — ABNORMAL HIGH (ref 70–99)
Glucose, Bld: 104 mg/dL — ABNORMAL HIGH (ref 70–99)
Glucose, Bld: 169 mg/dL — ABNORMAL HIGH (ref 70–99)
Glucose, Bld: 196 mg/dL — ABNORMAL HIGH (ref 70–99)
Glucose, Bld: 117 mg/dL — ABNORMAL HIGH (ref 70–99)
HCT: 34 % — ABNORMAL LOW (ref 36.0–46.0)
HCT: 31 % — ABNORMAL LOW (ref 36.0–46.0)
HCT: 23 % — ABNORMAL LOW (ref 36.0–46.0)
HCT: 21 % — ABNORMAL LOW (ref 36.0–46.0)
HCT: 23 % — ABNORMAL LOW (ref 36.0–46.0)
HCT: 27 % — ABNORMAL LOW (ref 36.0–46.0)
Hemoglobin: 11.6 g/dL — ABNORMAL LOW (ref 12.0–15.0)
Hemoglobin: 10.5 g/dL — ABNORMAL LOW (ref 12.0–15.0)
Hemoglobin: 7.8 g/dL — ABNORMAL LOW (ref 12.0–15.0)
Hemoglobin: 7.1 g/dL — ABNORMAL LOW (ref 12.0–15.0)
Hemoglobin: 7.8 g/dL — ABNORMAL LOW (ref 12.0–15.0)
Hemoglobin: 9.2 g/dL — ABNORMAL LOW (ref 12.0–15.0)
Potassium: 4 mEq/L (ref 3.5–5.1)
Potassium: 3.9 mEq/L (ref 3.5–5.1)
Potassium: 3.6 mEq/L (ref 3.5–5.1)
Potassium: 3.7 mEq/L (ref 3.5–5.1)
Potassium: 4.7 mEq/L (ref 3.5–5.1)
Potassium: 3.3 mEq/L — ABNORMAL LOW (ref 3.5–5.1)
Sodium: 142 mEq/L (ref 135–145)
Sodium: 141 mEq/L (ref 135–145)
Sodium: 134 mEq/L — ABNORMAL LOW (ref 135–145)
Sodium: 138 mEq/L (ref 135–145)
Sodium: 139 mEq/L (ref 135–145)
Sodium: 142 mEq/L (ref 135–145)

## 2011-06-25 LAB — POCT I-STAT 3, ART BLOOD GAS (G3+)
Acid-base deficit: 1 mmol/L (ref 0.0–2.0)
Acid-base deficit: 2 mmol/L (ref 0.0–2.0)
Acid-base deficit: 4 mmol/L — ABNORMAL HIGH (ref 0.0–2.0)
Acid-base deficit: 4 mmol/L — ABNORMAL HIGH (ref 0.0–2.0)
Acid-base deficit: 3 mmol/L — ABNORMAL HIGH (ref 0.0–2.0)
Bicarbonate: 23.9 mEq/L (ref 20.0–24.0)
Bicarbonate: 22.4 mEq/L (ref 20.0–24.0)
Bicarbonate: 21 mEq/L (ref 20.0–24.0)
Bicarbonate: 22.4 mEq/L (ref 20.0–24.0)
Bicarbonate: 21.5 mEq/L (ref 20.0–24.0)
O2 Saturation: 100 %
O2 Saturation: 100 %
O2 Saturation: 98 %
O2 Saturation: 98 %
O2 Saturation: 93 %
Patient temperature: 36.9
Patient temperature: 37.1
TCO2: 25 mmol/L (ref 0–100)
TCO2: 23 mmol/L (ref 0–100)
TCO2: 22 mmol/L (ref 0–100)
TCO2: 24 mmol/L (ref 0–100)
TCO2: 23 mmol/L (ref 0–100)
pCO2 arterial: 37.6 mmHg (ref 35.0–45.0)
pCO2 arterial: 33.6 mmHg — ABNORMAL LOW (ref 35.0–45.0)
pCO2 arterial: 37.8 mmHg (ref 35.0–45.0)
pCO2 arterial: 43.2 mmHg (ref 35.0–45.0)
pCO2 arterial: 36.9 mmHg (ref 35.0–45.0)
pH, Arterial: 7.412 — ABNORMAL HIGH (ref 7.350–7.400)
pH, Arterial: 7.432 — ABNORMAL HIGH (ref 7.350–7.400)
pH, Arterial: 7.352 (ref 7.350–7.400)
pH, Arterial: 7.322 — ABNORMAL LOW (ref 7.350–7.400)
pH, Arterial: 7.373 (ref 7.350–7.400)
pO2, Arterial: 258 mmHg — ABNORMAL HIGH (ref 80.0–100.0)
pO2, Arterial: 241 mmHg — ABNORMAL HIGH (ref 80.0–100.0)
pO2, Arterial: 110 mmHg — ABNORMAL HIGH (ref 80.0–100.0)
pO2, Arterial: 113 mmHg — ABNORMAL HIGH (ref 80.0–100.0)
pO2, Arterial: 69 mmHg — ABNORMAL LOW (ref 80.0–100.0)

## 2011-06-25 LAB — POCT I-STAT, CHEM 8
BUN: 8 mg/dL (ref 6–23)
Calcium, Ion: 1.11 mmol/L — ABNORMAL LOW (ref 1.12–1.32)
Chloride: 109 mEq/L (ref 96–112)
Creatinine, Ser: 0.6 mg/dL (ref 0.50–1.10)
Glucose, Bld: 116 mg/dL — ABNORMAL HIGH (ref 70–99)
HCT: 26 % — ABNORMAL LOW (ref 36.0–46.0)
Hemoglobin: 8.8 g/dL — ABNORMAL LOW (ref 12.0–15.0)
Potassium: 4.5 mEq/L (ref 3.5–5.1)
Sodium: 141 mEq/L (ref 135–145)
TCO2: 21 mmol/L (ref 0–100)

## 2011-06-25 LAB — CBC
HCT: 28.3 % — ABNORMAL LOW (ref 36.0–46.0)
HCT: 28.1 % — ABNORMAL LOW (ref 36.0–46.0)
Hemoglobin: 9.6 g/dL — ABNORMAL LOW (ref 12.0–15.0)
Hemoglobin: 9.5 g/dL — ABNORMAL LOW (ref 12.0–15.0)
MCH: 29.4 pg (ref 26.0–34.0)
MCH: 29.2 pg (ref 26.0–34.0)
MCHC: 33.9 g/dL (ref 30.0–36.0)
MCHC: 33.8 g/dL (ref 30.0–36.0)
MCV: 86.8 fL (ref 78.0–100.0)
MCV: 86.5 fL (ref 78.0–100.0)
Platelets: 148 10*3/uL — ABNORMAL LOW (ref 150–400)
Platelets: 191 10*3/uL (ref 150–400)
RBC: 3.26 MIL/uL — ABNORMAL LOW (ref 3.87–5.11)
RBC: 3.25 MIL/uL — ABNORMAL LOW (ref 3.87–5.11)
RDW: 13.3 % (ref 11.5–15.5)
RDW: 13.4 % (ref 11.5–15.5)
WBC: 15.6 10*3/uL — ABNORMAL HIGH (ref 4.0–10.5)
WBC: 18.4 10*3/uL — ABNORMAL HIGH (ref 4.0–10.5)

## 2011-06-25 LAB — CREATININE, SERUM
Creatinine, Ser: 0.66 mg/dL (ref 0.50–1.10)
GFR calc Af Amer: >90 mL/min (ref >90)
GFR calc non Af Amer: >90 mL/min (ref >90)

## 2011-06-25 LAB — MAGNESIUM: Magnesium: 3.2 mg/dL — ABNORMAL HIGH (ref 1.5–2.5)

## 2011-06-25 LAB — APTT: aPTT: 31 seconds (ref 24–37)

## 2011-06-25 LAB — PROTIME-INR
INR: 1.46 (ref 0.00–1.49)
Prothrombin Time: 18 seconds — ABNORMAL HIGH (ref 11.6–15.2)

## 2011-06-25 LAB — PLATELET COUNT: Platelets: 218 10*3/uL (ref 150–400)

## 2011-06-25 LAB — HEMOGLOBIN AND HEMATOCRIT, BLOOD
HCT: 22.6 % — ABNORMAL LOW (ref 36.0–46.0)
Hemoglobin: 7.6 g/dL — ABNORMAL LOW (ref 12.0–15.0)

## 2011-06-25 SURGERY — REPLACEMENT, AORTIC VALVE, OPEN
Anesthesia: General | Site: Chest | Wound class: Contaminated

## 2011-06-25 MED ORDER — PROTAMINE SULFATE 10 MG/ML IV SOLN
INTRAVENOUS | Status: DC | PRN
  Administered 2011-06-25: 225 mg via INTRAVENOUS
  Administered 2011-06-25: 25 mg via INTRAVENOUS

## 2011-06-25 MED ORDER — SODIUM CHLORIDE 0.9 % IV SOLN
INTRAVENOUS | Status: DC | PRN
  Administered 2011-06-25 (×2): via INTRAVENOUS

## 2011-06-25 MED ORDER — SODIUM CHLORIDE 0.9 % IV SOLN: INTRAVENOUS | Status: DC

## 2011-06-25 MED ORDER — PANTOPRAZOLE SODIUM 40 MG PO TBEC
40.0000 mg | DELAYED_RELEASE_TABLET | Freq: Every day | ORAL | Status: DC
  Administered 2011-06-27 – 2011-06-29 (×3): 40 mg via ORAL
  Filled 2011-06-25 (×2): qty 1

## 2011-06-25 MED ORDER — SODIUM CHLORIDE 0.9 % IJ SOLN
3.0000 mL | Freq: Two times a day (BID) | INTRAMUSCULAR | Status: DC
  Administered 2011-06-26 – 2011-06-27 (×4): 3 mL via INTRAVENOUS

## 2011-06-25 MED ORDER — HEMOSTATIC AGENTS (NO CHARGE) OPTIME
TOPICAL | Status: DC | PRN
  Administered 2011-06-25 (×2): 1 via TOPICAL

## 2011-06-25 MED ORDER — NITROGLYCERIN IN D5W 200-5 MCG/ML-% IV SOLN: 0.0000 ug/min | INTRAVENOUS | Status: DC

## 2011-06-25 MED ORDER — FAMOTIDINE IN NACL 20-0.9 MG/50ML-% IV SOLN
20.0000 mg | Freq: Two times a day (BID) | INTRAVENOUS | Status: AC
  Administered 2011-06-25: 20 mg via INTRAVENOUS
  Filled 2011-06-25: qty 50

## 2011-06-25 MED ORDER — METOPROLOL TARTRATE 12.5 MG HALF TABLET
12.5000 mg | ORAL_TABLET | Freq: Two times a day (BID) | ORAL | Status: DC
  Administered 2011-06-27 – 2011-06-28 (×3): 12.5 mg via ORAL
  Filled 2011-06-25 (×11): qty 1

## 2011-06-25 MED ORDER — SODIUM CHLORIDE 0.9 % IR SOLN
Status: DC | PRN
  Administered 2011-06-25: 7000 mL

## 2011-06-25 MED ORDER — DIAZEPAM 5 MG/ML IJ SOLN: 5.0000 mg | Freq: Once | INTRAMUSCULAR | Status: DC

## 2011-06-25 MED ORDER — LACTATED RINGERS IV SOLN
500.0000 mL | Freq: Once | INTRAVENOUS | Status: AC | PRN
  Administered 2011-06-25: 1000 mL via INTRAVENOUS

## 2011-06-25 MED ORDER — PROPOFOL 10 MG/ML IV EMUL
INTRAVENOUS | Status: DC | PRN
  Administered 2011-06-25: 50 mg via INTRAVENOUS

## 2011-06-25 MED ORDER — DEXTROSE 5 % IV SOLN
1.5000 g | INTRAVENOUS | Status: DC | PRN
  Administered 2011-06-25: 1.5 g via INTRAVENOUS

## 2011-06-25 MED ORDER — ONDANSETRON HCL 4 MG/2ML IJ SOLN
INTRAMUSCULAR | Status: AC
  Administered 2011-06-26: 4 mg via INTRAVENOUS
  Filled 2011-06-25: qty 2

## 2011-06-25 MED ORDER — METOPROLOL TARTRATE 25 MG/10 ML ORAL SUSPENSION
12.5000 mg | Freq: Two times a day (BID) | ORAL | Status: DC
  Filled 2011-06-25 (×7): qty 5

## 2011-06-25 MED ORDER — ACETAMINOPHEN 160 MG/5ML PO SOLN
975.0000 mg | Freq: Four times a day (QID) | ORAL | Status: AC
  Filled 2011-06-25: qty 40.6

## 2011-06-25 MED ORDER — POTASSIUM CHLORIDE 10 MEQ/50ML IV SOLN
10.0000 meq | Freq: Once | INTRAVENOUS | Status: AC
  Administered 2011-06-25: 10 meq via INTRAVENOUS

## 2011-06-25 MED ORDER — POTASSIUM CHLORIDE 10 MEQ/50ML IV SOLN: 10.0000 meq | Freq: Once | INTRAVENOUS | Status: DC

## 2011-06-25 MED ORDER — METOPROLOL TARTRATE 1 MG/ML IV SOLN: 2.5000 mg | INTRAVENOUS | Status: DC | PRN

## 2011-06-25 MED ORDER — DOCUSATE SODIUM 100 MG PO CAPS
200.0000 mg | ORAL_CAPSULE | Freq: Every day | ORAL | Status: DC
  Administered 2011-06-26 – 2011-07-01 (×4): 200 mg via ORAL
  Filled 2011-06-25 (×6): qty 2

## 2011-06-25 MED ORDER — NITROGLYCERIN IN D5W 200-5 MCG/ML-% IV SOLN
INTRAVENOUS | Status: DC | PRN
  Administered 2011-06-25: 33 ug/min via INTRAVENOUS

## 2011-06-25 MED ORDER — POTASSIUM CHLORIDE 10 MEQ/50ML IV SOLN
10.0000 meq | INTRAVENOUS | Status: AC
  Administered 2011-06-25 (×2): 10 meq via INTRAVENOUS

## 2011-06-25 MED ORDER — DOPAMINE-DEXTROSE 1.6-5 MG/ML-% IV SOLN
INTRAVENOUS | Status: DC | PRN
  Administered 2011-06-25: 4 ug/kg/min via INTRAVENOUS

## 2011-06-25 MED ORDER — ALBUMIN HUMAN 5 % IV SOLN
250.0000 mL | INTRAVENOUS | Status: DC | PRN
  Administered 2011-06-25 (×2): 250 mL via INTRAVENOUS

## 2011-06-25 MED ORDER — MIDAZOLAM HCL 2 MG/2ML IJ SOLN: 2.0000 mg | INTRAMUSCULAR | Status: DC | PRN

## 2011-06-25 MED ORDER — PHENYLEPHRINE HCL 10 MG/ML IJ SOLN
0.0000 ug/min | INTRAVENOUS | Status: DC
  Administered 2011-06-26: 10 ug/min via INTRAVENOUS
  Filled 2011-06-25: qty 2

## 2011-06-25 MED ORDER — CEFUROXIME SODIUM 750 MG IJ SOLR
INTRAMUSCULAR | Status: DC | PRN
  Administered 2011-06-25: 750 mg via INTRAVENOUS

## 2011-06-25 MED ORDER — HEPARIN SODIUM (PORCINE) 1000 UNIT/ML IJ SOLN
INTRAMUSCULAR | Status: DC | PRN
  Administered 2011-06-25: 25000 [IU] via INTRAVENOUS

## 2011-06-25 MED ORDER — ASPIRIN EC 325 MG PO TBEC
325.0000 mg | DELAYED_RELEASE_TABLET | Freq: Every day | ORAL | Status: DC
  Administered 2011-06-26: 325 mg via ORAL
  Filled 2011-06-25 (×2): qty 1

## 2011-06-25 MED ORDER — SODIUM CHLORIDE 0.9 % IV SOLN
0.1000 ug/kg/h | INTRAVENOUS | Status: DC
  Filled 2011-06-25: qty 2

## 2011-06-25 MED ORDER — MIDAZOLAM HCL 5 MG/5ML IJ SOLN
INTRAMUSCULAR | Status: DC | PRN
  Administered 2011-06-25: 2 mg via INTRAVENOUS
  Administered 2011-06-25: 3 mg via INTRAVENOUS
  Administered 2011-06-25: 2 mg via INTRAVENOUS
  Administered 2011-06-25: 3 mg via INTRAVENOUS

## 2011-06-25 MED ORDER — VANCOMYCIN HCL 1000 MG IV SOLR
1250.0000 mg | INTRAVENOUS | Status: DC | PRN
  Administered 2011-06-25: 1.25 g via INTRAVENOUS

## 2011-06-25 MED ORDER — ROCURONIUM BROMIDE 100 MG/10ML IV SOLN
INTRAVENOUS | Status: DC | PRN
  Administered 2011-06-25: 70 mg via INTRAVENOUS
  Administered 2011-06-25: 20 mg via INTRAVENOUS
  Administered 2011-06-25: 30 mg via INTRAVENOUS
  Administered 2011-06-25: 20 mg via INTRAVENOUS
  Administered 2011-06-25: 30 mg via INTRAVENOUS

## 2011-06-25 MED ORDER — SODIUM CHLORIDE 0.9 % IV SOLN
200.0000 ug | INTRAVENOUS | Status: DC | PRN
  Administered 2011-06-25: .2 ug via INTRAVENOUS

## 2011-06-25 MED ORDER — SODIUM CHLORIDE 0.9 % IV SOLN
10.0000 g | INTRAVENOUS | Status: DC | PRN
  Administered 2011-06-25: 5 g/h via INTRAVENOUS

## 2011-06-25 MED ORDER — TRAMADOL HCL 50 MG PO TABS: 50.0000 mg | ORAL_TABLET | Freq: Four times a day (QID) | ORAL | Status: DC | PRN

## 2011-06-25 MED ORDER — ASPIRIN 81 MG PO CHEW: 324.0000 mg | CHEWABLE_TABLET | Freq: Every day | ORAL | Status: DC

## 2011-06-25 MED ORDER — SODIUM CHLORIDE 0.9 % IJ SOLN
3.0000 mL | INTRAMUSCULAR | Status: DC | PRN
  Administered 2011-06-30: 3 mL via INTRAVENOUS

## 2011-06-25 MED ORDER — ONDANSETRON HCL 4 MG/2ML IJ SOLN
4.0000 mg | Freq: Four times a day (QID) | INTRAMUSCULAR | Status: DC | PRN
  Administered 2011-06-25 – 2011-06-27 (×4): 4 mg via INTRAVENOUS
  Filled 2011-06-25 (×3): qty 2

## 2011-06-25 MED ORDER — FENTANYL CITRATE 0.05 MG/ML IJ SOLN
INTRAMUSCULAR | Status: AC
  Administered 2011-06-26: 50 ug via INTRAVENOUS
  Filled 2011-06-25: qty 2

## 2011-06-25 MED ORDER — ACETAMINOPHEN 500 MG PO TABS
1000.0000 mg | ORAL_TABLET | Freq: Four times a day (QID) | ORAL | Status: AC
  Administered 2011-06-26 – 2011-06-30 (×18): 1000 mg via ORAL
  Filled 2011-06-25 (×20): qty 2

## 2011-06-25 MED ORDER — SODIUM CHLORIDE 0.45 % IV SOLN
INTRAVENOUS | Status: DC
  Administered 2011-06-25 – 2011-06-27 (×2): 20 mL/h via INTRAVENOUS

## 2011-06-25 MED ORDER — LACTATED RINGERS IV SOLN
INTRAVENOUS | Status: DC
  Administered 2011-06-25 (×2): 20 mL/h via INTRAVENOUS

## 2011-06-25 MED ORDER — ALBUMIN HUMAN 5 % IV SOLN
INTRAVENOUS | Status: DC | PRN
  Administered 2011-06-25: 11:00:00 via INTRAVENOUS

## 2011-06-25 MED ORDER — BISACODYL 10 MG RE SUPP: 10.0000 mg | Freq: Every day | RECTAL | Status: DC

## 2011-06-25 MED ORDER — SODIUM CHLORIDE 0.9 % IJ SOLN
OROMUCOSAL | Status: DC | PRN
  Administered 2011-06-25 (×3): via TOPICAL

## 2011-06-25 MED ORDER — DEXTROSE 5 % IV SOLN
1.5000 g | Freq: Two times a day (BID) | INTRAVENOUS | Status: AC
  Administered 2011-06-25 – 2011-06-27 (×4): 1.5 g via INTRAVENOUS
  Filled 2011-06-25 (×4): qty 1.5

## 2011-06-25 MED ORDER — DIAZEPAM 5 MG PO TABS
10.0000 mg | ORAL_TABLET | Freq: Once | ORAL | Status: AC
  Administered 2011-06-25: 10 mg via ORAL

## 2011-06-25 MED ORDER — ACETAMINOPHEN 650 MG RE SUPP
650.0000 mg | RECTAL | Status: AC
  Administered 2011-06-25: 650 mg via RECTAL

## 2011-06-25 MED ORDER — FENTANYL CITRATE 0.05 MG/ML IJ SOLN
INTRAMUSCULAR | Status: DC | PRN
  Administered 2011-06-25: 750 ug via INTRAVENOUS
  Administered 2011-06-25: 150 ug via INTRAVENOUS
  Administered 2011-06-25: 250 ug via INTRAVENOUS
  Administered 2011-06-25: 100 ug via INTRAVENOUS
  Administered 2011-06-25: 250 ug via INTRAVENOUS

## 2011-06-25 MED ORDER — PHENYLEPHRINE HCL 10 MG/ML IJ SOLN
40.0000 ug/mL | INTRAMUSCULAR | Status: DC | PRN
  Administered 2011-06-25: 50 ug/min via INTRAVENOUS

## 2011-06-25 MED ORDER — SODIUM CHLORIDE 0.9 % IV SOLN
100.0000 [IU] | INTRAVENOUS | Status: DC | PRN
  Administered 2011-06-25: 1.5 [IU]/h via INTRAVENOUS

## 2011-06-25 MED ORDER — SODIUM CHLORIDE 0.9 % IV SOLN
1000.0000 mg | Freq: Once | INTRAVENOUS | Status: AC
  Administered 2011-06-25: 1000 mg via INTRAVENOUS
  Filled 2011-06-25: qty 1000

## 2011-06-25 MED ORDER — SODIUM CHLORIDE 0.9 % IV SOLN
INTRAVENOUS | Status: DC
  Filled 2011-06-25: qty 1

## 2011-06-25 MED ORDER — SODIUM CHLORIDE 0.9 % IV SOLN: 250.0000 mL | INTRAVENOUS | Status: DC

## 2011-06-25 MED ORDER — HYDROMORPHONE HCL 2 MG PO TABS: 1.0000 mg | ORAL_TABLET | ORAL | Status: DC | PRN

## 2011-06-25 MED ORDER — DOPAMINE-DEXTROSE 3.2-5 MG/ML-% IV SOLN
2.0000 ug/kg/min | INTRAVENOUS | Status: DC
  Administered 2011-06-25: 1 ug/kg/min via INTRAVENOUS

## 2011-06-25 MED ORDER — FENTANYL CITRATE 0.05 MG/ML IJ SOLN
50.0000 ug | INTRAMUSCULAR | Status: DC | PRN
  Administered 2011-06-25: 50 ug via INTRAVENOUS
  Administered 2011-06-25: 25 ug via INTRAVENOUS
  Administered 2011-06-25 – 2011-06-26 (×7): 50 ug via INTRAVENOUS
  Filled 2011-06-25 (×4): qty 2

## 2011-06-25 MED ORDER — MAGNESIUM SULFATE 50 % IJ SOLN
4.0000 g | Freq: Once | INTRAVENOUS | Status: AC
  Administered 2011-06-25: 4 g via INTRAVENOUS
  Filled 2011-06-25: qty 8

## 2011-06-25 MED ORDER — BISACODYL 5 MG PO TBEC
10.0000 mg | DELAYED_RELEASE_TABLET | Freq: Every day | ORAL | Status: DC
  Administered 2011-06-26 – 2011-06-27 (×2): 10 mg via ORAL
  Filled 2011-06-25 (×2): qty 2

## 2011-06-25 MED ORDER — ACETAMINOPHEN 160 MG/5ML PO SOLN: 650.0000 mg | ORAL | Status: AC

## 2011-06-25 MED ORDER — LACTATED RINGERS IV SOLN
INTRAVENOUS | Status: DC | PRN
  Administered 2011-06-25 (×5): via INTRAVENOUS

## 2011-06-25 MED FILL — Epinephrine HCl Inj 1 MG/ML: INTRAMUSCULAR | Qty: 4 | Status: AC

## 2011-06-25 MED FILL — Insulin Regular (Human) Inj 100 Unit/ML: INTRAMUSCULAR | Qty: 10 | Status: AC

## 2011-06-25 MED FILL — Papaverine HCl Inj 30 MG/ML: INTRAMUSCULAR | Qty: 2 | Status: AC

## 2011-06-25 MED FILL — Heparin Sodium (Porcine) Inj 5000 Unit/ML: INTRAMUSCULAR | Qty: 1 | Status: AC

## 2011-06-25 MED FILL — Magnesium Sulfate Inj 50%: INTRAMUSCULAR | Qty: 2 | Status: AC

## 2011-06-25 MED FILL — Potassium Chloride Inj 2 mEq/ML: INTRAVENOUS | Qty: 40 | Status: AC

## 2011-06-25 MED FILL — Dextrose Inj 5%: INTRAVENOUS | Qty: 1000 | Status: AC

## 2011-06-25 MED FILL — Cefuroxime Sodium For Inj 750 MG: INTRAMUSCULAR | Qty: 750 | Status: AC

## 2011-06-25 MED FILL — Phenylephrine HCl Inj 10 MG/ML: INTRAMUSCULAR | Qty: 1 | Status: AC

## 2011-06-25 MED FILL — Dopamine in Dextrose 5% Inj 3.2 MG/ML: INTRAVENOUS | Qty: 250 | Status: AC

## 2011-06-25 MED FILL — Aminocaproic Acid Inj 250 MG/ML: INTRAVENOUS | Qty: 40 | Status: AC

## 2011-06-25 MED FILL — Nitroglycerin IV Soln 200 MCG/ML in D5W: INTRAVENOUS | Qty: 250 | Status: AC

## 2011-06-25 MED FILL — Dexmedetomidine HCl IV Soln 200 MCG/2ML: INTRAVENOUS | Qty: 4 | Status: AC

## 2011-06-25 MED FILL — Cefuroxime Sodium For Inj 1.5 GM: INTRAMUSCULAR | Qty: 1.5 | Status: AC

## 2011-06-25 MED FILL — Phenylephrine HCl Inj 10 MG/ML: INTRAMUSCULAR | Qty: 4 | Status: AC

## 2011-06-25 SURGICAL SUPPLY — 82 items
ADAPTER CARDIO PERF ANTE/RETRO (ADAPTER) ×3 IMPLANT
ADH SRG 12 PREFL SYR 3 SPRDR (MISCELLANEOUS)
ADPR PRFSN 84XANTGRD RTRGD (ADAPTER) ×1
APPLICATOR COTTON TIP 6IN STRL (MISCELLANEOUS) ×2 IMPLANT
ATTRACTOMAT 16X20 MAGNETIC DRP (DRAPES) ×3 IMPLANT
BAG DECANTER FOR FLEXI CONT (MISCELLANEOUS) ×3 IMPLANT
BLADE SAW STERNAL (BLADE) ×3 IMPLANT
BLADE SURG 15 STRL LF DISP TIS (BLADE) IMPLANT
BLADE SURG 15 STRL SS (BLADE)
CANISTER SUCTION 2500CC (MISCELLANEOUS) ×3 IMPLANT
CANNULA GUNDRY RCSP 15FR (MISCELLANEOUS) ×3 IMPLANT
CATH RETROPLEGIA CORONARY 14FR (CATHETERS) ×2 IMPLANT
CATH ROBINSON RED A/P 18FR (CATHETERS) ×10 IMPLANT
CLIP FOGARTY SPRING 6M (CLIP) IMPLANT
CLOTH BEACON ORANGE TIMEOUT ST (SAFETY) ×3 IMPLANT
COVER SURGICAL LIGHT HANDLE (MISCELLANEOUS) ×6 IMPLANT
CRADLE DONUT ADULT HEAD (MISCELLANEOUS) ×3 IMPLANT
DRAIN CHANNEL 32F RND 10.7 FF (WOUND CARE) ×2 IMPLANT
DRAPE CARDIOVASCULAR INCISE (DRAPES) ×3
DRAPE SLUSH MACHINE 52X66 (DRAPES) ×1 IMPLANT
DRAPE SLUSH/WARMER DISC (DRAPES) ×2 IMPLANT
DRAPE SRG 135X102X78XABS (DRAPES) ×1 IMPLANT
DRSG COVADERM 4X14 (GAUZE/BANDAGES/DRESSINGS) ×3 IMPLANT
ELECT CAUTERY BLADE 6.4 (BLADE) ×2 IMPLANT
ELECT REM PT RETURN 9FT ADLT (ELECTROSURGICAL) ×6
ELECTRODE REM PT RTRN 9FT ADLT (ELECTROSURGICAL) ×2 IMPLANT
GOWN STRL NON-REIN LRG LVL3 (GOWN DISPOSABLE) ×18 IMPLANT
HEMOSTAT POWDER SURGIFOAM 1G (HEMOSTASIS) ×8 IMPLANT
HEMOSTAT SURGICEL 2X14 (HEMOSTASIS) ×2 IMPLANT
INSERT FOGARTY XLG (MISCELLANEOUS) IMPLANT
KIT BASIN OR (CUSTOM PROCEDURE TRAY) ×3 IMPLANT
KIT ROOM TURNOVER OR (KITS) ×3 IMPLANT
KIT SUCTION CATH 14FR (SUCTIONS) IMPLANT
LINE VENT (MISCELLANEOUS) ×2 IMPLANT
MARKER GRAFT CORONARY BYPASS (MISCELLANEOUS) IMPLANT
NS IRRIG 1000ML POUR BTL (IV SOLUTION) ×15 IMPLANT
PACK OPEN HEART (CUSTOM PROCEDURE TRAY) ×3 IMPLANT
PAD ARMBOARD 7.5X6 YLW CONV (MISCELLANEOUS) ×3 IMPLANT
PENCIL BUTTON HOLSTER BLD 10FT (ELECTRODE) IMPLANT
SET CARDIOPLEGIA MPS 5001102 (MISCELLANEOUS) ×2 IMPLANT
SPONGE GAUZE 4X4 12PLY (GAUZE/BANDAGES/DRESSINGS) ×4 IMPLANT
SURGIFLO TRUKIT (HEMOSTASIS) ×2 IMPLANT
SUT ETHIBON 2 0 V 52N 30 (SUTURE) ×5 IMPLANT
SUT ETHIBON EXCEL 2-0 V-5 (SUTURE) IMPLANT
SUT ETHIBOND 2 0 SH (SUTURE)
SUT ETHIBOND 2 0 SH 36X2 (SUTURE) IMPLANT
SUT ETHIBOND 2 0 V4 (SUTURE) IMPLANT
SUT ETHIBOND 2 0V4 GREEN (SUTURE) IMPLANT
SUT ETHIBOND 4 0 RB 1 (SUTURE) IMPLANT
SUT ETHIBOND NAB MH 2-0 36IN (SUTURE) ×2 IMPLANT
SUT ETHIBOND V-5 VALVE (SUTURE) IMPLANT
SUT PROLENE 3 0 SH 1 (SUTURE) IMPLANT
SUT PROLENE 3 0 SH DA (SUTURE) IMPLANT
SUT PROLENE 4 0 RB 1 (SUTURE) ×18
SUT PROLENE 4 0 SH DA (SUTURE) IMPLANT
SUT PROLENE 4-0 RB1 .5 CRCL 36 (SUTURE) IMPLANT
SUT PROLENE 5 0 C 1 36 (SUTURE) ×6 IMPLANT
SUT SILK  1 MH (SUTURE) ×4
SUT SILK 1 MH (SUTURE) ×2 IMPLANT
SUT SILK 1 TIES 10X30 (SUTURE) ×3 IMPLANT
SUT SILK 2 0 (SUTURE) ×3
SUT SILK 2 0 SH CR/8 (SUTURE) ×6 IMPLANT
SUT SILK 2-0 18XBRD TIE 12 (SUTURE) ×1 IMPLANT
SUT SILK 3 0 SH CR/8 (SUTURE) ×3 IMPLANT
SUT SILK 4 0 (SUTURE) ×3
SUT SILK 4-0 18XBRD TIE 12 (SUTURE) ×1 IMPLANT
SUT STEEL 6MS V (SUTURE) IMPLANT
SUT TEM PAC WIRE 2 0 SH (SUTURE) ×12 IMPLANT
SUT VIC AB 1 CTX 36 (SUTURE)
SUT VIC AB 1 CTX36XBRD ANBCTR (SUTURE) IMPLANT
SUT VIC AB 2-0 CTX 27 (SUTURE) ×6 IMPLANT
SUT VIC AB 3-0 X1 27 (SUTURE) IMPLANT
SYR 10ML KIT SKIN ADHESIVE (MISCELLANEOUS) IMPLANT
SYSTEM SAHARA CHEST DRAIN ATS (WOUND CARE) ×3 IMPLANT
TAPE CLOTH SURG 4X10 WHT LF (GAUZE/BANDAGES/DRESSINGS) ×2 IMPLANT
TOWEL OR 17X24 6PK STRL BLUE (TOWEL DISPOSABLE) ×3 IMPLANT
TOWEL OR 17X26 10 PK STRL BLUE (TOWEL DISPOSABLE) ×3 IMPLANT
TRAY FOLEY IC TEMP SENS 16FR (CATHETERS) ×3 IMPLANT
UNDERPAD 30X30 INCONTINENT (UNDERPADS AND DIAPERS) ×3 IMPLANT
VALVE AORTIC TOP HAT (Prosthesis & Implant Heart) ×3 IMPLANT
VALVE AORTIC TOP HAT 19 (Prosthesis & Implant Heart) IMPLANT
WATER STERILE IRR 1000ML POUR (IV SOLUTION) ×6 IMPLANT

## 2011-06-25 NOTE — Op Note (Signed)
NAMEGRANT, HENKES NO.:  [id number]  MEDICAL RECORD NO.:  [id number]  LOCATION:  2305                         FACILITY:  [facility]  PHYSICIAN:  Kerin Perna, M.D.  DATE OF BIRTH:  1956-05-20  DATE OF PROCEDURE: DATE OF DISCHARGE:                              OPERATIVE REPORT   OPERATION:  Aortic valve replacement with a mechanical Sorin bileaflet valve, serial [id number], size 19 mm.  SURGEON:  Kerin Perna, MD.  ASSISTANT:  Al Corpus, SA.  PREOPERATIVE DIAGNOSIS:  Bicuspid aortic valve with severe aortic stenosis and class III congestive heart failure.  POSTOPERATIVE DIAGNOSIS:  Bicuspid aortic valve with severe aortic stenosis and class III congestive heart failure.  ANESTHESIA:  General by Dr. Autumn Patty.  INDICATION:  The patient is a 55 year old, Caucasian obese female, followed with serial echoes for mild aortic stenosis.  Recently, she developed severe shortness of breath when walking stadium steps at [place] football field and felt like she was ready to pass out.  A followup echo and subsequent cath by Dr. Sharol Harness demonstrated severe aortic stenosis with normal coronaries.  She was recommended for aortic valve replacement.  I examined the patient in the office before surgery and discussed the situation and reviewed the results of her echo and cath. She agreed with aortic valve replacement using a mechanical valve due to her age of 25 years and no contraindication or long-term anticoagulation.  She understood and agreed to proceed with surgery and specifically understood the risks of stroke, bleeding, blood transfusion, pneumonia, infection, and death.  OPERATIVE FINDINGS: 1. Calcified aortic stenosis secondary to a bicuspid aortic valve. 2. Normal-sized ascending aorta. 3. LVH. 4. Normal functioning mechanical aortic prosthesis following     separation for bypass. 5. No blood products required.  DESCRIPTION OF PROCEDURE:  The  patient was brought to the operating room, placed supine on the operative table.  General anesthesia was induced.  A sternal incision was made.  The sternum was retracted.  The pericardium was opened and suspended.  Heparin was administered and the ACT was documented as being therapeutic.  Pursestrings were placed in the ascending aorta and right atrium.  The patient was cannulated and placed on cardiopulmonary bypass.  Cardioplegia cannula was placed for both antegrade and retrograde cold blood cardioplegia, and a LV vent was placed via the right superior pulmonary vein.  The patient was cooled to 32 degrees and aortic cross-clamp was applied, 800 mL of cold blood cardioplegia was delivered in split doses between the antegrade aortic and retrograde coronary sinus catheters.  There is good cardioplegic arrest and septal temperature dropped less than 10 degrees. Cardioplegia was delivered using the retrograde coronary sinus catheter every 20 minutes while the cross-clamp was in place.  The transverse aortotomy was performed.  The valve was inspected.  It was bicuspid stenotic calcified.  The valve leaflets were excised and the anulus was debrided of calcium.  The outflow tract was irrigated with copious amounts of cold saline.  The anulus was sized to a 19 mm Sorin mechanical prosthesis.  Subannular pledgeted 2-0 Ethibond sutures were placed, numbering 12 total.  The sutures were placed through the sewing  ring of the valve.  The valve was seated and the sutures were tied.  There was excellent confirmation of the valve to the patient's anulus.  The leaflets opened and closed freely and there was a good size match.  The coronary ostium were widely patent and not obstructed. After irrigating the valve, again the aortotomy was closed using 2 layers of running 4-0 Prolene.  Prior to releasing the cross-clamp, air was vented from the coronaries in the left side of the heart using head down  position, as well as insufflation of the operative field with CO2 during the entire operation.  The crossclamp was removed.  Heart resumed a spontaneous rhythm.  Aortotomy was hemostatic.  The cardioplegia cannula was removed.  Temporary pacing wires were applied. The patient was warmed to 37 degrees.  The lungs were expanded.  The ventilator was resumed.  The patient was then weaned from bypass on low-dose dopamine without difficulty.  She required AV sequential pacing.  Blood pressure and cardiac output were normal.  Protamine was administered without adverse reaction.  The cannula was removed.  The mediastinum was irrigated with saline.  The superior pericardial fat was closed over the aorta.  A mediastinal chest tube was placed and brought up through a separate incision.  The sternum was closed with interrupted steel wire.  The pectoralis fascia was closed in running #1 Vicryl.  Subcutaneous and skin layers were closed in running Vicryl and sterile dressings were applied.  Total cardiopulmonary bypass time was 80 minutes.     Kerin Perna, M.D.     PV/MEDQ  D:  06/25/2011  T:  06/25/2011  Job:  [id number]  cc:   Veverly Fells. Excell Seltzer, MD

## 2011-06-25 NOTE — Transfer of Care (Signed)
Immediate Anesthesia Transfer of Care Note  Patient: [name]  Procedure(s) Performed:  AORTIC VALVE REPLACEMENT (AVR)  Patient Location: PACU and SICU  Anesthesia Type: General  Level of Consciousness: unresponsive  Airway & Oxygen Therapy: Patient remains intubated per anesthesia plan  Post-op Assessment: Report given to PACU RN and Post -op Vital signs reviewed and stable  Post vital signs: Reviewed and stable  Complications: No apparent anesthesia complications

## 2011-06-25 NOTE — Progress Notes (Signed)
Respiratory Therapy Note- ETT removed 2 cm per Dr. Donata Clay and  X-ray.

## 2011-06-25 NOTE — Anesthesia Postprocedure Evaluation (Signed)
  Anesthesia Post-op Note  Patient: ANNELLA Marquez  Procedure(s) Performed:  AORTIC VALVE REPLACEMENT (AVR)  Patient Location: SICU  Anesthesia Type: General  Level of Consciousness: sedated  Airway and Oxygen Therapy: Patient remains intubated per anesthesia plan  Post-op Pain: none  Post-op Assessment: Patient's Cardiovascular Status Stable  Post-op Vital Signs: stable  Complications: No apparent anesthesia complications

## 2011-06-25 NOTE — Anesthesia Procedure Notes (Addendum)
Date/Time: 06/25/2011 8:54 AM Performed by: Tyrone Nine

## 2011-06-25 NOTE — Procedures (Signed)
Extubation Procedure Note  Patient Details:   Name: [name] DOB: 14-Jul-1956 MRN: [id number]   Airway Documentation:  AIRWAYS (Active)     AIRWAYS 8 mm (Active)  Secured at (cm) 21 cm 06/25/2011 12:00 AM    Evaluation  O2 sats: stable throughout Complications: No apparent complications Patient did tolerate procedure well. Bilateral Breath Sounds: Clear   Yes  Newt Lukes 06/25/2011, 5:45 PM

## 2011-06-25 NOTE — Anesthesia Preprocedure Evaluation (Addendum)
Anesthesia Evaluation  Patient identified by MRN, date of birth, ID band Patient awake    Reviewed: Allergy & Precautions, H&P , NPO status , Patient's Chart, lab work & pertinent test results  Airway Mallampati: II TM Distance: <3 FB Neck ROM: full    Dental  (+) Teeth Intact   Pulmonary shortness of breath and with exertion, pneumonia  (resolved), former smoker clear to auscultation        Cardiovascular Exercise Tolerance: Poor - angina+ Valvular Problems/Murmurs AS regular Normal+ Systolic murmurs    Neuro/Psych  Headaches, PSYCHIATRIC DISORDERS Anxiety    GI/Hepatic Neg liver ROS, GERD-  Controlled and Medicated,  Endo/Other  Diabetes mellitus-, Well Controlled, Type 2, Oral Hypoglycemic Agents  Renal/GU negative Renal ROS  Genitourinary negative   Musculoskeletal negative musculoskeletal ROS (+)   Abdominal   Peds negative pediatric ROS (+)  Hematology negative hematology ROS (+)   Anesthesia Other Findings Neck short w/ 2FB to TM  Reproductive/Obstetrics negative OB ROS                         Anesthesia Physical Anesthesia Plan  ASA: IV  Anesthesia Plan: General   Post-op Pain Management:    Induction: Intravenous  Airway Management Planned: Oral ETT  Additional Equipment: Arterial line, TEE, CVP, PA Cath and Ultrasound Guidance Line Placement  Intra-op Plan:   Post-operative Plan: Post-operative intubation/ventilation  Informed Consent: I have reviewed the patients History and Physical, chart, labs and discussed the procedure including the risks, benefits and alternatives for the proposed anesthesia with the patient or authorized representative who has indicated his/her understanding and acceptance.     Plan Discussed with: CRNA  Anesthesia Plan Comments:         Anesthesia Quick Evaluation

## 2011-06-25 NOTE — Preoperative (Signed)
Beta Blockers   Reason not to administer Beta Blockers:Not Applicable 

## 2011-06-25 NOTE — Progress Notes (Signed)
Pt's famliy oriented to unit, offered opportunity to ask questions.  Questions answered by RN.

## 2011-06-25 NOTE — Progress Notes (Signed)
[facility] BRIEF SICU PROGRESS NOTE  Day of Surgery  S/P Procedure(s) (LRB): AORTIC VALVE REPLACEMENT (AVR) (N/A)   Extubated uneventfully. Stable hemodynamics. Chest tube output low.   Assessment/Plan:  Continue routine postop  [name] H

## 2011-06-25 NOTE — Progress Notes (Signed)
This note also relates to the following rows which could not be included: Dose - Cannot attach notes to extension rows  SBP in 80s.  PAD 7.

## 2011-06-25 NOTE — Brief Op Note (Signed)
06/25/2011  12:16 PM  PATIENT:  [name]  55 y.o. female  PRE-OPERATIVE DIAGNOSIS:  AORTIC VALVE STENOSIS  POST-OPERATIVE DIAGNOSIS:  Aortic valve Stenosis  PROCEDURE:  Procedure(s): AORTIC VALVE REPLACEMENT (AVR)  SURGEON:  Surgeon(s): Mikey Bussing, MD  PHYSICIAN ASSISTANT: Al Corpus.S.A.     ANESTHESIA:   general  EBL:  Total I/O In: 3835 [I.V.:3350; Blood:485] Out: 2650 [Urine:1050; Blood:1600]  BLOOD ADMINISTERED:485 CC CELLSAVER  DRAINS: (1) Blake drain(s) in the mediastinum   LOCAL MEDICATIONS USED:  NONE  SPECIMEN:  Source of Specimen:  aortic valve  DISPOSITION OF SPECIMEN:  PATHOLOGY  DICTATION: .Dragon Dictation  PLAN OF CARE: Admit to inpatient   PATIENT DISPOSITION:  ICU - intubated and hemodynamically stable.

## 2011-06-25 NOTE — Pre-Procedure Instructions (Signed)
The patient was examined and preop studies reviewed. There has been no change from the prior exam and the patient is ready for surgery.She understands we will use a mechanical valve if possible.              Kerin Perna MD

## 2011-06-25 NOTE — OR Nursing (Signed)
OR nurses note: Dr. Donata Clay implanted 19 mm Carbomedics Supra-annular (top Hat) Prosthetic Aortic heart valve.

## 2011-06-26 ENCOUNTER — Inpatient Hospital Stay (HOSPITAL_COMMUNITY): Payer: Commercial Managed Care - PPO

## 2011-06-26 LAB — GLUCOSE, CAPILLARY
Glucose-Capillary: 106 mg/dL — ABNORMAL HIGH (ref 70–99)
Glucose-Capillary: 108 mg/dL — ABNORMAL HIGH (ref 70–99)
Glucose-Capillary: 120 mg/dL — ABNORMAL HIGH (ref 70–99)
Glucose-Capillary: 121 mg/dL — ABNORMAL HIGH (ref 70–99)
Glucose-Capillary: 119 mg/dL — ABNORMAL HIGH (ref 70–99)
Glucose-Capillary: 91 mg/dL (ref 70–99)
Glucose-Capillary: 127 mg/dL — ABNORMAL HIGH (ref 70–99)
Glucose-Capillary: 128 mg/dL — ABNORMAL HIGH (ref 70–99)
Glucose-Capillary: 155 mg/dL — ABNORMAL HIGH (ref 70–99)
Glucose-Capillary: 203 mg/dL — ABNORMAL HIGH (ref 70–99)
Glucose-Capillary: 155 mg/dL — ABNORMAL HIGH (ref 70–99)

## 2011-06-26 LAB — CBC
HCT: 28 % — ABNORMAL LOW (ref 36.0–46.0)
HCT: 27.8 % — ABNORMAL LOW (ref 36.0–46.0)
Hemoglobin: 9.2 g/dL — ABNORMAL LOW (ref 12.0–15.0)
Hemoglobin: 9.2 g/dL — ABNORMAL LOW (ref 12.0–15.0)
MCH: 29 pg (ref 26.0–34.0)
MCH: 29.6 pg (ref 26.0–34.0)
MCHC: 32.9 g/dL (ref 30.0–36.0)
MCHC: 33.1 g/dL (ref 30.0–36.0)
MCV: 88.3 fL (ref 78.0–100.0)
MCV: 89.4 fL (ref 78.0–100.0)
Platelets: 187 10*3/uL (ref 150–400)
Platelets: 154 10*3/uL (ref 150–400)
RBC: 3.17 MIL/uL — ABNORMAL LOW (ref 3.87–5.11)
RBC: 3.11 MIL/uL — ABNORMAL LOW (ref 3.87–5.11)
RDW: 13.9 % (ref 11.5–15.5)
RDW: 14.3 % (ref 11.5–15.5)
WBC: 18.5 10*3/uL — ABNORMAL HIGH (ref 4.0–10.5)
WBC: 18.1 10*3/uL — ABNORMAL HIGH (ref 4.0–10.5)

## 2011-06-26 LAB — BASIC METABOLIC PANEL
BUN: 10 mg/dL (ref 6–23)
CO2: 23 mEq/L (ref 19–32)
Calcium: 8.3 mg/dL — ABNORMAL LOW (ref 8.4–10.5)
Chloride: 108 mEq/L (ref 96–112)
Creatinine, Ser: 0.64 mg/dL (ref 0.50–1.10)
GFR calc Af Amer: >90 mL/min (ref >90)
GFR calc non Af Amer: >90 mL/min (ref >90)
Glucose, Bld: 110 mg/dL — ABNORMAL HIGH (ref 70–99)
Potassium: 4.3 mEq/L (ref 3.5–5.1)
Sodium: 139 mEq/L (ref 135–145)

## 2011-06-26 LAB — POCT I-STAT, CHEM 8
BUN: 13 mg/dL (ref 6–23)
Calcium, Ion: 1.17 mmol/L (ref 1.12–1.32)
Chloride: 103 mEq/L (ref 96–112)
Creatinine, Ser: 0.8 mg/dL (ref 0.50–1.10)
Glucose, Bld: 194 mg/dL — ABNORMAL HIGH (ref 70–99)
HCT: 28 % — ABNORMAL LOW (ref 36.0–46.0)
Hemoglobin: 9.5 g/dL — ABNORMAL LOW (ref 12.0–15.0)
Potassium: 4.3 mEq/L (ref 3.5–5.1)
Sodium: 138 mEq/L (ref 135–145)
TCO2: 23 mmol/L (ref 0–100)

## 2011-06-26 LAB — CREATININE, SERUM
Creatinine, Ser: 0.74 mg/dL (ref 0.50–1.10)
GFR calc Af Amer: >90 mL/min (ref >90)
GFR calc non Af Amer: >90 mL/min (ref >90)

## 2011-06-26 LAB — MAGNESIUM
Magnesium: 2.6 mg/dL — ABNORMAL HIGH (ref 1.5–2.5)
Magnesium: 2.3 mg/dL (ref 1.5–2.5)

## 2011-06-26 MED ORDER — INSULIN GLARGINE 100 UNIT/ML ~~LOC~~ SOLN
24.0000 [IU] | Freq: Every day | SUBCUTANEOUS | Status: DC
  Administered 2011-06-26 – 2011-06-27 (×2): 24 [IU] via SUBCUTANEOUS
  Filled 2011-06-26: qty 3

## 2011-06-26 MED ORDER — INSULIN ASPART 100 UNIT/ML ~~LOC~~ SOLN: 0.0000 [IU] | SUBCUTANEOUS | Status: DC

## 2011-06-26 MED ORDER — POTASSIUM CHLORIDE CRYS ER 20 MEQ PO TBCR
20.0000 meq | EXTENDED_RELEASE_TABLET | Freq: Two times a day (BID) | ORAL | Status: DC
  Administered 2011-06-26 – 2011-07-01 (×11): 20 meq via ORAL
  Filled 2011-06-26 (×13): qty 1

## 2011-06-26 MED ORDER — FUROSEMIDE 10 MG/ML IJ SOLN
20.0000 mg | Freq: Once | INTRAMUSCULAR | Status: AC
  Administered 2011-06-26: 20 mg via INTRAVENOUS
  Filled 2011-06-26: qty 2

## 2011-06-26 MED ORDER — WARFARIN SODIUM 2.5 MG PO TABS
2.5000 mg | ORAL_TABLET | Freq: Every day | ORAL | Status: DC
  Filled 2011-06-26: qty 1

## 2011-06-26 MED ORDER — ROSUVASTATIN CALCIUM 20 MG PO TABS
20.0000 mg | ORAL_TABLET | Freq: Every day | ORAL | Status: DC
Start: 2011-06-26 — End: 2011-06-26
  Filled 2011-06-26: qty 1

## 2011-06-26 MED ORDER — INSULIN ASPART 100 UNIT/ML ~~LOC~~ SOLN
0.0000 [IU] | SUBCUTANEOUS | Status: AC
  Administered 2011-06-26 (×2): 2 [IU] via SUBCUTANEOUS
  Filled 2011-06-26: qty 3

## 2011-06-26 MED ORDER — ROSUVASTATIN CALCIUM 20 MG PO TABS
20.0000 mg | ORAL_TABLET | Freq: Every day | ORAL | Status: DC
  Administered 2011-06-26 – 2011-06-30 (×5): 20 mg via ORAL
  Filled 2011-06-26 (×6): qty 1

## 2011-06-26 MED ORDER — TRAMADOL HCL 50 MG PO TABS
50.0000 mg | ORAL_TABLET | ORAL | Status: DC | PRN
  Administered 2011-06-26 – 2011-06-27 (×3): 50 mg via ORAL
  Filled 2011-06-26 (×3): qty 1

## 2011-06-26 MED ORDER — ALUM & MAG HYDROXIDE-SIMETH 200-200-20 MG/5ML PO SUSP
15.0000 mL | ORAL | Status: DC | PRN
  Administered 2011-06-26 – 2011-06-28 (×2): 15 mL via ORAL
  Filled 2011-06-26 (×2): qty 30

## 2011-06-26 MED ORDER — KETOROLAC TROMETHAMINE 15 MG/ML IJ SOLN
INTRAMUSCULAR | Status: AC
  Filled 2011-06-26: qty 1

## 2011-06-26 MED ORDER — FUROSEMIDE 10 MG/ML IJ SOLN
20.0000 mg | Freq: Two times a day (BID) | INTRAMUSCULAR | Status: DC
  Administered 2011-06-26 – 2011-06-27 (×3): 20 mg via INTRAVENOUS
  Filled 2011-06-26 (×5): qty 2

## 2011-06-26 MED ORDER — WARFARIN SODIUM 2.5 MG PO TABS
2.5000 mg | ORAL_TABLET | Freq: Every day | ORAL | Status: DC
  Administered 2011-06-26: 2.5 mg via ORAL
  Filled 2011-06-26 (×2): qty 1

## 2011-06-26 MED ORDER — KETOROLAC TROMETHAMINE 15 MG/ML IJ SOLN
15.0000 mg | Freq: Four times a day (QID) | INTRAMUSCULAR | Status: AC
  Administered 2011-06-26 (×2): 15 mg via INTRAVENOUS
  Filled 2011-06-26: qty 1

## 2011-06-26 MED ORDER — INSULIN ASPART 100 UNIT/ML ~~LOC~~ SOLN
0.0000 [IU] | SUBCUTANEOUS | Status: DC
  Administered 2011-06-26 (×2): 2 [IU] via SUBCUTANEOUS
  Administered 2011-06-26 – 2011-06-27 (×2): 8 [IU] via SUBCUTANEOUS
  Administered 2011-06-27 (×2): 2 [IU] via SUBCUTANEOUS

## 2011-06-26 MED ORDER — SODIUM CHLORIDE 0.9 % IV SOLN
INTRAVENOUS | Status: DC | PRN
  Filled 2011-06-26: qty 1

## 2011-06-26 NOTE — Progress Notes (Signed)
                   [address]            [name] [id number]     Patient examined and record reviewed.Hemodynamics stable,labs satisfactory.Patient had stable day.Continue current care.  Very weak did not tolerate OOB to chair well. CBG's hi, will start Lantus [name],[name] 06/26/2011

## 2011-06-26 NOTE — Progress Notes (Signed)
1 Day Post-Op Procedure(s) (LRB):    AORTIC VALVE REPLACEMENT (AVR) (N/A)                            [address]            [name] [address]          [phone]         Subjective: POD#1 AVR  Carbomedics AVR Mild incisional pain Off IV insulin- SSI to start    Objective: Vital signs in last 24 hours: Temp:  [96.6 F (35.9 C)-99.7 F (37.6 C)] 99.5 F (37.5 C) (11/14 0815) Pulse Rate:  [73-97] 75  (11/14 0815) Cardiac Rhythm:  [-] Normal sinus rhythm (11/14 0815) Resp:  [10-30] 24  (11/14 0815) BP: (78-126)/(32-77) 94/57 mmHg (11/14 0815) SpO2:  [90 %-100 %] 96 % (11/14 0815) FiO2 (%):  [39.9 %-50.2 %] 40.1 % (11/13 1715) Weight:  [184 lb 8.4 oz (83.7 kg)-192 lb 0.3 oz (87.1 kg)] 192 lb 0.3 oz (87.1 kg) (11/14 0445)  Hemodynamic parameters for last 24 hours: PAP: (20-49)/(8-29) 35/12 mmHg CO:  [4.5 L/min-5.3 L/min] 5.3 L/min CI:  [2.5 L/min/m2-2.9 L/min/m2] 2.9 L/min/m2  Intake/Output from previous day: 11/13 0701 - 11/14 0700 In: 7477.6 [P.O.:50; I.V.:5242.6; Blood:485; IV Piggyback:1700] Out: 5664 [Urine:3735; Blood:1600; Chest Tube:329] Intake/Output this shift: Total I/O In: 47.5 [I.V.:47.5] Out: 35 [Urine:35]  EXAM  Lungs clear, cardiac rhythm sinus, extremities warm, Neuro intact, abdomen soft  Lab Results:  Basename 06/26/11 0421 06/25/11 1815  WBC 18.5* 18.4*  HGB 9.2* 9.5*8.8*  HCT 28.0* 28.1*26.0*  PLT 187 191   BMET:  Basename 06/26/11 0421 06/25/11 1815  NA 139 141  K 4.3 4.5  CL 108 109  CO2 23 --  GLUCOSE 110* 116*  BUN 10 8  CREATININE 0.64 0.660.60  CALCIUM 8.3* --    PT/INR:  Basename 06/25/11 1300  LABPROT 18.0*  INR 1.46   ABG    Component Value Date/Time   PHART 7.373 06/25/2011 1821   HCO3 21.5 06/25/2011 1821   TCO2 23 06/25/2011 1821   ACIDBASEDEF 3.0* 06/25/2011 1821   O2SAT 93.0 06/25/2011 1821   CBG (last 3)   Basename 06/26/11 0729 06/26/11 0620 06/26/11 0417  GLUCAP 127* 119* 91     Assessment/Plan: S/P Procedure(s) (LRB): AORTIC VALVE REPLACEMENT (AVR) (N/A) 1 diuresis with Lasix 2 mobilize out of bed to chair 3 glucose control transition off IV insulin 4 follow elevated white count 5 begin Coumadin for mechanical AVR   LOS: 1 day    [name] 06/26/2011

## 2011-06-27 ENCOUNTER — Encounter (HOSPITAL_COMMUNITY): Payer: Self-pay | Admitting: Cardiothoracic Surgery

## 2011-06-27 ENCOUNTER — Inpatient Hospital Stay (HOSPITAL_COMMUNITY): Payer: Commercial Managed Care - PPO

## 2011-06-27 DIAGNOSIS — E1165 Type 2 diabetes mellitus with hyperglycemia: Secondary | ICD-10-CM

## 2011-06-27 DIAGNOSIS — IMO0001 Reserved for inherently not codable concepts without codable children: Secondary | ICD-10-CM

## 2011-06-27 LAB — CBC
HCT: 26.8 % — ABNORMAL LOW (ref 36.0–46.0)
Hemoglobin: 8.7 g/dL — ABNORMAL LOW (ref 12.0–15.0)
MCH: 29.5 pg (ref 26.0–34.0)
MCHC: 32.5 g/dL (ref 30.0–36.0)
MCV: 90.8 fL (ref 78.0–100.0)
Platelets: 141 10*3/uL — ABNORMAL LOW (ref 150–400)
RBC: 2.95 MIL/uL — ABNORMAL LOW (ref 3.87–5.11)
RDW: 14.2 % (ref 11.5–15.5)
WBC: 15.8 10*3/uL — ABNORMAL HIGH (ref 4.0–10.5)

## 2011-06-27 LAB — COMPREHENSIVE METABOLIC PANEL
ALT: 10 U/L (ref 0–35)
AST: 25 U/L (ref 0–37)
Albumin: 3 g/dL — ABNORMAL LOW (ref 3.5–5.2)
Alkaline Phosphatase: 53 U/L (ref 39–117)
BUN: 17 mg/dL (ref 6–23)
CO2: 27 mEq/L (ref 19–32)
Calcium: 8.5 mg/dL (ref 8.4–10.5)
Chloride: 103 mEq/L (ref 96–112)
Creatinine, Ser: 0.75 mg/dL (ref 0.50–1.10)
GFR calc Af Amer: >90 mL/min (ref >90)
GFR calc non Af Amer: >90 mL/min (ref >90)
Glucose, Bld: 138 mg/dL — ABNORMAL HIGH (ref 70–99)
Potassium: 4.5 mEq/L (ref 3.5–5.1)
Sodium: 137 mEq/L (ref 135–145)
Total Bilirubin: 0.3 mg/dL (ref 0.3–1.2)
Total Protein: 5.8 g/dL — ABNORMAL LOW (ref 6.0–8.3)

## 2011-06-27 LAB — GLUCOSE, CAPILLARY
Glucose-Capillary: 117 mg/dL — ABNORMAL HIGH (ref 70–99)
Glucose-Capillary: 126 mg/dL — ABNORMAL HIGH (ref 70–99)
Glucose-Capillary: 148 mg/dL — ABNORMAL HIGH (ref 70–99)
Glucose-Capillary: 154 mg/dL — ABNORMAL HIGH (ref 70–99)
Glucose-Capillary: 163 mg/dL — ABNORMAL HIGH (ref 70–99)
Glucose-Capillary: 210 mg/dL — ABNORMAL HIGH (ref 70–99)

## 2011-06-27 LAB — PROTIME-INR
INR: 1.27 (ref 0.00–1.49)
INR: 1.34 (ref 0.00–1.49)
Prothrombin Time: 16.2 seconds — ABNORMAL HIGH (ref 11.6–15.2)
Prothrombin Time: 16.8 seconds — ABNORMAL HIGH (ref 11.6–15.2)

## 2011-06-27 MED ORDER — BISACODYL 10 MG RE SUPP: 10.0000 mg | Freq: Every day | RECTAL | Status: DC | PRN

## 2011-06-27 MED ORDER — PATIENT'S GUIDE TO USING COUMADIN BOOK
Freq: Once | Status: AC
  Administered 2011-06-27: 18:00:00
  Filled 2011-06-27: qty 1

## 2011-06-27 MED ORDER — ASPIRIN 81 MG PO CHEW: 324.0000 mg | CHEWABLE_TABLET | Freq: Every day | ORAL | Status: DC

## 2011-06-27 MED ORDER — METOPROLOL TARTRATE 12.5 MG HALF TABLET: 12.5000 mg | ORAL_TABLET | Freq: Two times a day (BID) | ORAL | Status: DC

## 2011-06-27 MED ORDER — SODIUM CHLORIDE 0.9 % IJ SOLN
3.0000 mL | Freq: Two times a day (BID) | INTRAMUSCULAR | Status: DC
  Administered 2011-06-27 – 2011-07-01 (×7): 3 mL via INTRAVENOUS

## 2011-06-27 MED ORDER — DOCUSATE SODIUM 100 MG PO CAPS: 200.0000 mg | ORAL_CAPSULE | Freq: Every day | ORAL | Status: DC

## 2011-06-27 MED ORDER — ALPRAZOLAM 0.25 MG PO TABS: 0.2500 mg | ORAL_TABLET | Freq: Four times a day (QID) | ORAL | Status: DC | PRN

## 2011-06-27 MED ORDER — MOVING RIGHT ALONG BOOK
Freq: Once | Status: AC
  Administered 2011-06-27: 16:00:00
  Filled 2011-06-27: qty 1

## 2011-06-27 MED ORDER — INSULIN ASPART 100 UNIT/ML ~~LOC~~ SOLN
0.0000 [IU] | Freq: Three times a day (TID) | SUBCUTANEOUS | Status: DC
  Administered 2011-06-27: 2 [IU] via SUBCUTANEOUS
  Administered 2011-06-27: 4 [IU] via SUBCUTANEOUS
  Administered 2011-06-28: 2 [IU] via SUBCUTANEOUS

## 2011-06-27 MED ORDER — ASPIRIN EC 81 MG PO TBEC
81.0000 mg | DELAYED_RELEASE_TABLET | Freq: Every day | ORAL | Status: DC
  Administered 2011-06-27 – 2011-07-01 (×5): 81 mg via ORAL
  Filled 2011-06-27 (×6): qty 1

## 2011-06-27 MED ORDER — POTASSIUM CHLORIDE CRYS ER 20 MEQ PO TBCR: 20.0000 meq | EXTENDED_RELEASE_TABLET | Freq: Once | ORAL | Status: DC

## 2011-06-27 MED ORDER — POVIDONE-IODINE 10 % EX SOLN
1.0000 "application " | Freq: Two times a day (BID) | CUTANEOUS | Status: DC
  Administered 2011-06-27 – 2011-07-01 (×8): 1 via TOPICAL
  Filled 2011-06-27: qty 15

## 2011-06-27 MED ORDER — WARFARIN VIDEO
Freq: Once | Status: AC
  Administered 2011-06-27: 19:00:00
  Filled 2011-06-27: qty 1

## 2011-06-27 MED ORDER — WARFARIN SODIUM 5 MG PO TABS
5.0000 mg | ORAL_TABLET | Freq: Every day | ORAL | Status: DC
  Administered 2011-06-27: 5 mg via ORAL
  Filled 2011-06-27 (×2): qty 1

## 2011-06-27 MED ORDER — FUROSEMIDE 40 MG PO TABS
40.0000 mg | ORAL_TABLET | Freq: Every day | ORAL | Status: DC
  Administered 2011-06-27 – 2011-07-01 (×5): 40 mg via ORAL
  Filled 2011-06-27 (×5): qty 1

## 2011-06-27 MED ORDER — MAGNESIUM HYDROXIDE 400 MG/5ML PO SUSP: 30.0000 mL | Freq: Every day | ORAL | Status: DC | PRN

## 2011-06-27 MED ORDER — BISACODYL 5 MG PO TBEC: 10.0000 mg | DELAYED_RELEASE_TABLET | Freq: Every day | ORAL | Status: DC | PRN

## 2011-06-27 MED FILL — Sodium Chloride IV Soln 0.9%: INTRAVENOUS | Qty: 1000 | Status: AC

## 2011-06-27 MED FILL — Sodium Chloride Irrigation Soln 0.9%: Qty: 3000 | Status: AC

## 2011-06-27 MED FILL — Heparin Sodium (Porcine) Inj 1000 Unit/ML: INTRAMUSCULAR | Qty: 60 | Status: AC

## 2011-06-27 MED FILL — Electrolyte-R (PH 7.4) Solution: INTRAVENOUS | Qty: 4000 | Status: AC

## 2011-06-27 NOTE — Progress Notes (Signed)
                   [address]            [name] [address]          [phone]       POD#2 AVR carbomedics NSR CXR clear  Needs PT,weak Tx to PCTU todaINR 1.4,coumadin ordered

## 2011-06-27 NOTE — Progress Notes (Signed)
Pt has t/x orders to unit 2000. Report called to Kathlene November, RN and he is present at bedside for pt's arrival to unit and hook up to telemetry. Pt's husband called and made aware of t/x. Pt transported in wheelchair on monitor without event.  Felipa Emory

## 2011-06-27 NOTE — Progress Notes (Signed)
UR Completed.  [name] 06/27/2011  

## 2011-06-28 ENCOUNTER — Inpatient Hospital Stay (HOSPITAL_COMMUNITY): Payer: Commercial Managed Care - PPO

## 2011-06-28 LAB — BASIC METABOLIC PANEL
BUN: 15 mg/dL (ref 6–23)
CO2: 31 mEq/L (ref 19–32)
Calcium: 8.7 mg/dL (ref 8.4–10.5)
Chloride: 101 mEq/L (ref 96–112)
Creatinine, Ser: 0.68 mg/dL (ref 0.50–1.10)
GFR calc Af Amer: >90 mL/min (ref >90)
GFR calc non Af Amer: >90 mL/min (ref >90)
Glucose, Bld: 116 mg/dL — ABNORMAL HIGH (ref 70–99)
Potassium: 3.5 mEq/L (ref 3.5–5.1)
Sodium: 140 mEq/L (ref 135–145)

## 2011-06-28 LAB — CBC
HCT: 29.2 % — ABNORMAL LOW (ref 36.0–46.0)
Hemoglobin: 9.4 g/dL — ABNORMAL LOW (ref 12.0–15.0)
MCH: 29 pg (ref 26.0–34.0)
MCHC: 32.2 g/dL (ref 30.0–36.0)
MCV: 90.1 fL (ref 78.0–100.0)
Platelets: 185 10*3/uL (ref 150–400)
RBC: 3.24 MIL/uL — ABNORMAL LOW (ref 3.87–5.11)
RDW: 14 % (ref 11.5–15.5)
WBC: 15.4 10*3/uL — ABNORMAL HIGH (ref 4.0–10.5)

## 2011-06-28 LAB — GLUCOSE, CAPILLARY
Glucose-Capillary: 109 mg/dL — ABNORMAL HIGH (ref 70–99)
Glucose-Capillary: 134 mg/dL — ABNORMAL HIGH (ref 70–99)
Glucose-Capillary: 96 mg/dL (ref 70–99)
Glucose-Capillary: 161 mg/dL — ABNORMAL HIGH (ref 70–99)

## 2011-06-28 LAB — PROTIME-INR
INR: 1.26 (ref 0.00–1.49)
Prothrombin Time: 16.1 seconds — ABNORMAL HIGH (ref 11.6–15.2)

## 2011-06-28 MED ORDER — POTASSIUM CHLORIDE CRYS ER 20 MEQ PO TBCR: 20.0000 meq | EXTENDED_RELEASE_TABLET | Freq: Every day | ORAL | Status: DC

## 2011-06-28 MED ORDER — WARFARIN SODIUM 7.5 MG PO TABS
7.5000 mg | ORAL_TABLET | Freq: Once | ORAL | Status: AC
  Administered 2011-06-28: 7.5 mg via ORAL
  Filled 2011-06-28: qty 1

## 2011-06-28 MED ORDER — METFORMIN HCL 500 MG PO TABS
500.0000 mg | ORAL_TABLET | Freq: Every day | ORAL | Status: DC
  Administered 2011-06-29 – 2011-07-01 (×3): 500 mg via ORAL
  Filled 2011-06-28 (×4): qty 1

## 2011-06-28 MED ORDER — POTASSIUM CHLORIDE CRYS ER 20 MEQ PO TBCR
40.0000 meq | EXTENDED_RELEASE_TABLET | Freq: Once | ORAL | Status: AC
  Administered 2011-06-28: 40 meq via ORAL

## 2011-06-28 MED ORDER — WARFARIN SODIUM 5 MG PO TABS: 5.0000 mg | ORAL_TABLET | Freq: Every day | ORAL | Status: DC

## 2011-06-28 MED ORDER — INSULIN ASPART 100 UNIT/ML ~~LOC~~ SOLN
0.0000 [IU] | Freq: Three times a day (TID) | SUBCUTANEOUS | Status: DC
  Administered 2011-06-29: 4 [IU] via SUBCUTANEOUS
  Administered 2011-06-29 – 2011-07-01 (×7): 2 [IU] via SUBCUTANEOUS

## 2011-06-28 MED ORDER — WARFARIN SODIUM 5 MG PO TABS
5.0000 mg | ORAL_TABLET | Freq: Every day | ORAL | Status: DC
  Filled 2011-06-28: qty 1

## 2011-06-28 MED ORDER — METOPROLOL TARTRATE 12.5 MG HALF TABLET: 12.5000 mg | ORAL_TABLET | Freq: Two times a day (BID) | ORAL | Status: DC

## 2011-06-28 MED ORDER — FUROSEMIDE 40 MG PO TABS: 40.0000 mg | ORAL_TABLET | Freq: Every day | ORAL | Status: DC

## 2011-06-28 MED ORDER — TRAMADOL HCL 50 MG PO TABS: 50.0000 mg | ORAL_TABLET | ORAL | Status: AC | PRN

## 2011-06-28 NOTE — Progress Notes (Signed)
CARDIAC REHAB PHASE I   PRE:  Rate/Rhythm: 79 sr  BP:  Supine:   Sitting: 79/53  Standing:    SaO2: 96% 3L 90% RA  MODE:  Ambulation:350  ft   POST:  Rate/Rhythem: 86 SR  BP:  Supine:   Sitting: 103/54  Standing:    SaO2: 88 - 90 % on ra  1435 - 1525 Pt ambulated on ra with rw tolerated well. Back to chair with feet up. Permission for out pt card rehab. 02 left off after walking.   Rosalie Doctor

## 2011-06-28 NOTE — Progress Notes (Addendum)
MET WITH PT AND FAMILY TO DISCUSS DISCHARGE PLANS.  PTA, PT INDEPENDENT, LIVES WITH SPOUSE.  DAUGHTER TO PROVIDE 24HR CARE AT DISCHARGE.  MAY NEED RW AT DC--PT STATES SHE WILL LET us KNOW IF NEEDED.  WILL FOLLOW.    Jerrell Belfast, RN, BSN Pager # [phone]

## 2011-06-28 NOTE — Progress Notes (Addendum)
3 Days Post-Op Procedure(s) (LRB): AORTIC VALVE REPLACEMENT (AVR) (Mechanical valve)  Subjective: Patient continues to feel better.  No real complaints this am.  Objective: Vital signs in last 24 hours: Patient Vitals for the past 24 hrs:  BP Temp Temp src Pulse Resp SpO2 Height Weight  06/28/11 0432 109/57 mmHg 97.7 F (36.5 C) Oral 72  16  93 % 5\' 2"  (1.575 m) 186 lb 1.1 oz (84.4 kg)  06/28/11 0036 108/63 mmHg 97.5 F (36.4 C) Oral 75  20  98 % - -  06/27/11 2009 120/77 mmHg 97.7 F (36.5 C) Oral 76  20  92 % - -  06/27/11 1511 119/56 mmHg - - 75  18  94 % - -  06/27/11 1400 93/52 mmHg - - 71  24  94 % - -   Pre op weight  83 kg;Today's weight 84.4 kg.  Intake/Output from previous day: 11/15 0701 - 11/16 0700 In: 1395 [P.O.:1340; I.V.:3; IV Piggyback:52] Out: 2125 [Urine:1725; Chest Tube:400]   Physical Exam:  Cardiovascular: RRR, no murmurs, gallops, or rubs. Pulmonary: Clear to auscultation bilaterally; no rales, wheezes, or rhonchi. Abdomen: Soft, non tender, bowel sounds present. Extremities: Mild bilateral lower extremity edema. Wound: Clean and dry.  No erythema or signs of infection.  Lab Results: CBC: Basename 06/28/11 0704 06/27/11 0440  WBC 15.4* 15.8*  HGB 9.4* 8.7*  HCT 29.2* 26.8*  PLT 185 141*   BMET:  Basename 06/28/11 0704 06/27/11 0440  NA 140 137  K 3.5 4.5  CL 101 103  CO2 31 27  GLUCOSE 116* 138*  BUN 15 17  CREATININE 0.68 0.75  CALCIUM 8.7 8.5    PT/INR:  Basename 06/28/11 0704  LABPROT 16.1*  INR 1.26   CXR: Improvement in aeration, small bitlateral pleural effusions with atx, stable cardiomegaly, and no ptx.  Assessment/Plan:  1. CV - SR.  Continue with lopressor 12.5 bid and coumadin.  Will give 7.5 mg of coumadin this evening as INR slightly decreased from 1.34 to 1.26. 2.  Pulmonary - Wean O2 as tolerates and encourage incentive spirometer. 3. Volume Overload - Continue with diuresis. 4.  Acute blood loss anemia - H/H  slightly increased from 8.7/26.8 to 9.4/29.2. 5.DM-Patient was on Metformin 500 mg po daily pre op.  Will stoop scheduled insulin at bedtime, restart Metformin, and continue sliding scale PRN. 6.Leukocytosis-WBC slightly decreased from 15.8 to 15.4.  Patient remains afebrile, NO signs of wound infection, and no GU complaints.  Will check in am. 7.Supplement Potassium as is low at 3.5.   Ardelle Balls, PA 06/28/2011   patient examined and medical record reviewed,agree with above note. [name],Minas Bonser 06/28/2011

## 2011-06-29 DIAGNOSIS — I359 Nonrheumatic aortic valve disorder, unspecified: Secondary | ICD-10-CM

## 2011-06-29 DIAGNOSIS — Z952 Presence of prosthetic heart valve: Secondary | ICD-10-CM

## 2011-06-29 LAB — GLUCOSE, CAPILLARY
Glucose-Capillary: 114 mg/dL — ABNORMAL HIGH (ref 70–99)
Glucose-Capillary: 164 mg/dL — ABNORMAL HIGH (ref 70–99)
Glucose-Capillary: 244 mg/dL — ABNORMAL HIGH (ref 70–99)
Glucose-Capillary: 146 mg/dL — ABNORMAL HIGH (ref 70–99)

## 2011-06-29 LAB — CBC
HCT: 29.9 % — ABNORMAL LOW (ref 36.0–46.0)
Hemoglobin: 9.8 g/dL — ABNORMAL LOW (ref 12.0–15.0)
WBC: 13.1 10*3/uL — ABNORMAL HIGH (ref 4.0–10.5)

## 2011-06-29 LAB — PROTIME-INR
INR: 1.85 — ABNORMAL HIGH (ref 0.00–1.49)
Prothrombin Time: 21.7 seconds — ABNORMAL HIGH (ref 11.6–15.2)

## 2011-06-29 MED ORDER — WARFARIN SODIUM 5 MG PO TABS: 5.0000 mg | ORAL_TABLET | Freq: Every day | ORAL | Status: DC

## 2011-06-29 MED ORDER — WARFARIN SODIUM 7.5 MG PO TABS
7.5000 mg | ORAL_TABLET | Freq: Once | ORAL | Status: AC
  Administered 2011-06-29: 7.5 mg via ORAL
  Filled 2011-06-29: qty 1

## 2011-06-29 NOTE — Progress Notes (Signed)
  SUBJECTIVE  Doing well post AVR.  Filed Vitals:   06/28/11 1409 06/28/11 2206 06/28/11 2256 06/29/11 0532  BP: 94/66 102/67 98/70 118/75  Pulse: 107 84 83 70  Temp: 97.6 F (36.4 C) 97.2 F (36.2 C)  98.3 F (36.8 C)  TempSrc: Oral Oral  Oral  Resp: 18 20  20   Height:    5\' 2"  (1.575 m)  Weight:    182 lb 1.6 oz (82.6 kg)  SpO2: 93% 97%  96%    Intake/Output Summary (Last 24 hours) at 06/29/11 0740 Last data filed at 06/29/11 0536  Gross per 24 hour  Intake   1440 ml  Output   1950 ml  Net   -510 ml    LABS: Basic Metabolic Panel:  Basename 06/28/11 0704 06/27/11 0440 06/26/11 1500  NA 140 137 --  K 3.5 4.5 --  CL 101 103 --  CO2 31 27 --  GLUCOSE 116* 138* --  BUN 15 17 --  CREATININE 0.68 0.75 --  CALCIUM 8.7 8.5 --  MG -- -- 2.3  PHOS -- -- --   Liver Function Tests:  Basename 06/27/11 0440  AST 25  ALT 10  ALKPHOS 53  BILITOT 0.3  PROT 5.8*  ALBUMIN 3.0*   No results found for this basename: LIPASE:2,AMYLASE:2 in the last 72 hours CBC:  Basename 06/29/11 0700 06/28/11 0704  WBC 13.1* 15.4*  NEUTROABS -- --  HGB 9.8* 9.4*  HCT 29.9* 29.2*  MCV 91.2 90.1  PLT 232 185   Cardiac Enzymes: No results found for this basename: CKTOTAL:3,CKMB:3,CKMBINDEX:3,TROPONINI:3 in the last 72 hours BNP: No results found for this basename: POCBNP:3 in the last 72 hours D-Dimer: No results found for this basename: DDIMER:2 in the last 72 hours Hemoglobin A1C: No results found for this basename: HGBA1C in the last 72 hours Fasting Lipid Panel: No results found for this basename: CHOL,HDL,LDLCALC,TRIG,CHOLHDL,LDLDIRECT in the last 72 hours Thyroid Function Tests: No results found for this basename: TSH,T4TOTAL,FREET3,T3FREE,THYROIDAB in the last 72 hours  RADIOLOGY: Dg Chest 2 View  06/28/2011  *RADIOLOGY REPORT*  Clinical Data: Post CABG, follow-up  CHEST - 2 VIEW  Comparison: Portable chest x-ray of 06/27/2011  Findings: The lungs appear slightly better  aerated.  Basilar opacities persist consistent with atelectasis and effusions.  No pneumothorax is seen.  Cardiomegaly is stable.  IMPRESSION: Slightly better aeration.  Persistent basilar opacities consistent with atelectasis and effusions.  Original Report Authenticated By: Juline Patch, M.D.      PHYSICAL EXAM  patient is oriented to person time and place. Her husband is in the room. She's doing well. Cardiac exam reveals crisp closure of her aortic prosthesis. No aortic insufficiency is heard.   TELEMETRY: Telemetry is reviewed by me. She has normal sinus rhythm.  ASSESSMENT AND PLAN:  Active Problems:  Nontoxic multinodular goiter  HYPERLIPIDEMIA  AORTIC STENOSIS  GERD   S/P AVR (aortic valve replacement)      Patient is doing very well post aortic valve replacement. Her followup after the hospital will be with her primary cardiologist Dr. Excell Seltzer. She had normal coronary arteries and did not require coronary bypass.   [name] 06/29/2011 7:40 AM

## 2011-06-29 NOTE — Progress Notes (Addendum)
4 Days Post-Op Procedure(s) (LRB): AORTIC VALVE REPLACEMENT (AVR) (N/A)  Subjective: Ambulated without a walker this am and now feels tired. Doing well otherwise.  Objective: Vital signs in last 24 hours: Patient Vitals for the past 24 hrs:  BP Temp Temp src Pulse Resp SpO2 Height Weight  06/29/11 0532 118/75 mmHg 98.3 F (36.8 C) Oral 70  20  96 % 5\' 2"  (1.575 m) 182 lb 1.6 oz (82.6 kg)  06/28/11 2256 98/70 mmHg - - 83  - - - -  06/28/11 2206 102/67 mmHg 97.2 F (36.2 C) Oral 84  20  97 % - -  06/28/11 1409 94/66 mmHg 97.6 F (36.4 C) Oral 107  18  93 % - -   Current Weight  06/29/11 182 lb 1.6 oz (82.6 kg)    Hemodynamic parameters for last 24 hours:    Intake/Output from previous day: 11/16 0701 - 11/17 0700 In: 1680 [P.O.:1680] Out: 1950 [Urine:1950] Intake/Output this shift:    PHYSICAL EXAM:  Heart: RRR Lungs: clear to auscultation Wound: Clean and dry Extremities: Mild LE edema  Lab Results: CBC: Basename 06/29/11 0700 06/28/11 0704  WBC 13.1* 15.4*  HGB 9.8* 9.4*  HCT 29.9* 29.2*  PLT 232 185   BMET:  Basename 06/28/11 0704 06/27/11 0440  NA 140 137  K 3.5 4.5  CL 101 103  CO2 31 27  GLUCOSE 116* 138*  BUN 15 17  CREATININE 0.68 0.75  CALCIUM 8.7 8.5    PT/INR:  Basename 06/28/11 0704  LABPROT 16.1*  INR 1.26    Assessment/Plan: S/P Procedure(s) (LRB): AORTIC VALVE REPLACEMENT (AVR) (N/A)  CV- stable Mechanical AV- INR subtherapeutic.  Will give Coumadin 7.5 mg again tonight and watch  DM- sugars better, back on home meds Volume overload- diurese WBC trending down Mobilize, pulm toilet   LOS: 4 days    COLLINS,GINA H 06/29/2011   I have seen and examined the patient and agree with the assessment and plan as outlined.  Gauri Galvao H

## 2011-06-29 NOTE — Progress Notes (Signed)
CARDIAC REHAB PHASE I   PRE:  Rate/Rhythm: 97 SR   BP:  Supine:   Sitting: 105/54  Standing:    SaO2: 91 RA  MODE:  Ambulation: 450 ft   POST:  Rate/Rhythem: 98  BP:  Supine:   Sitting: 132/60  Standing:    SaO2: 91 RA  Pt tolerated walk well without complaint.  Reviewed d/c education with pt and husband.  Pt return to chair with husband at side. [id number]  Hazle Nordmann

## 2011-06-30 LAB — GLUCOSE, CAPILLARY
Glucose-Capillary: 132 mg/dL — ABNORMAL HIGH (ref 70–99)
Glucose-Capillary: 130 mg/dL — ABNORMAL HIGH (ref 70–99)
Glucose-Capillary: 130 mg/dL — ABNORMAL HIGH (ref 70–99)
Glucose-Capillary: 151 mg/dL — ABNORMAL HIGH (ref 70–99)

## 2011-06-30 LAB — PROTIME-INR
INR: 2.57 — ABNORMAL HIGH (ref 0.00–1.49)
Prothrombin Time: 28 seconds — ABNORMAL HIGH (ref 11.6–15.2)

## 2011-06-30 MED ORDER — WARFARIN SODIUM 5 MG PO TABS
5.0000 mg | ORAL_TABLET | Freq: Once | ORAL | Status: AC
  Administered 2011-06-30: 5 mg via ORAL
  Filled 2011-06-30: qty 1

## 2011-06-30 NOTE — Progress Notes (Signed)
  TELEMETRY: Reviewed telemetry pt in NSR: Filed Vitals:   06/29/11 0532 06/29/11 1545 06/29/11 2020 06/30/11 0450  BP: 118/75 110/68 113/66 114/67  Pulse: 70 89 91 91  Temp: 98.3 F (36.8 C) 98.8 F (37.1 C) 97.7 F (36.5 C) 98.2 F (36.8 C)  TempSrc: Oral Oral Oral Oral  Resp: 20 18 19 20   Height: 5\' 2"  (1.575 m)     Weight: 82.6 kg (182 lb 1.6 oz)     SpO2: 96% 93% 93% 91%    Intake/Output Summary (Last 24 hours) at 06/30/11 0740 Last data filed at 06/29/11 1225  Gross per 24 hour  Intake      0 ml  Output    900 ml  Net   -900 ml    SUBJECTIVE Feels very well. Ambulating without difficulty. No pain. No dyspnea.  LABS: Basic Metabolic Panel:  Basename 06/28/11 0704  NA 140  K 3.5  CL 101  CO2 31  GLUCOSE 116*  BUN 15  CREATININE 0.68  CALCIUM 8.7  MG --  PHOS --   CBC:  Basename 06/29/11 0700 06/28/11 0704  WBC 13.1* 15.4*  NEUTROABS -- --  HGB 9.8* 9.4*  HCT 29.9* 29.2*  MCV 91.2 90.1  PLT 232 185   Radiology/Studies:  Dg Chest 2 View  06/28/2011  *RADIOLOGY REPORT*  Clinical Data: Post CABG, follow-up  CHEST - 2 VIEW  Comparison: Portable chest x-ray of 06/27/2011  Findings: The lungs appear slightly better aerated.  Basilar opacities persist consistent with atelectasis and effusions.  No pneumothorax is seen.  Cardiomegaly is stable.  IMPRESSION: Slightly better aeration.  Persistent basilar opacities consistent with atelectasis and effusions.  Original Report Authenticated By: Juline Patch, M.D.     PHYSICAL EXAM General: Well developed, well nourished, in no acute distress. Head: Normocephalic, atraumatic, sclera non-icteric. Neck: Negative for carotid bruits. JVD not elevated. Lungs: Clear bilaterally to auscultation without wheezes, rales, or rhonchi. Breathing is unlabored. Heart: RRR Good AV click.  Abdomen: Soft, non-tender, non-distended with normoactive bowel sounds. No hepatomegaly. No rebound/guarding. No obvious abdominal  masses. Msk:  Strength and tone appears normal for age. Extremities: No clubbing, cyanosis or edema.  Distal pedal pulses are 2+ and equal bilaterally. Neuro: Alert and oriented X 3. Moves all extremities spontaneously. Psych:  Responds to questions appropriately with a normal affect.  ASSESSMENT AND PLAN:  Doing very well post AVR. Anticipate DC today or tomorrow. Needs follow up with Dr. Excell Seltzer post DC.  Active Problems:  Nontoxic multinodular goiter  HYPERLIPIDEMIA  AORTIC STENOSIS  GERD  S/P AVR (aortic valve replacement)    Signed, Imoni Kohen [place] MD

## 2011-06-30 NOTE — Progress Notes (Signed)
dc'ed pacing wires per unit protocol  

## 2011-06-30 NOTE — Progress Notes (Addendum)
5 Days Post-Op Procedure(s) (LRB): AORTIC VALVE REPLACEMENT (AVR) (N/A)  Subjective: Feels stronger today.  No complaints.  Refused Lopressor last pm - feels she doesn't need a blood pressure lowering med.  Objective: Vital signs in last 24 hours: Patient Vitals for the past 24 hrs:  BP Temp Temp src Pulse Resp SpO2  06/30/11 0450 114/67 mmHg 98.2 F (36.8 C) Oral 91  20  91 %  06/29/11 2020 113/66 mmHg 97.7 F (36.5 C) Oral 91  19  93 %  06/29/11 1545 110/68 mmHg 98.8 F (37.1 C) Oral 89  18  93 %   Current Weight  06/29/11 182 lb 1.6 oz (82.6 kg)    Hemodynamic parameters for last 24 hours:    Intake/Output from previous day: 11/17 0701 - 11/18 0700 In: -  Out: 900 [Urine:900] Intake/Output this shift:    PHYSICAL EXAM:  Heart: RRR, good valve click Lungs: clear Wound: clean and dry Extremities: mild LE edema  Lab Results: CBC: Basename 06/29/11 0700 06/28/11 0704  WBC 13.1* 15.4*  HGB 9.8* 9.4*  HCT 29.9* 29.2*  PLT 232 185   BMET:  Basename 06/28/11 0704  NA 140  K 3.5  CL 101  CO2 31  GLUCOSE 116*  BUN 15  CREATININE 0.68  CALCIUM 8.7    PT/INR:  Basename 06/30/11 0835  LABPROT 28.0*  INR 2.57*    Assessment/Plan: S/P Procedure(s) (LRB): AORTIC VALVE REPLACEMENT (AVR) (N/A) Mobilize Diabetes control d/c pacing wires D/C chest tube sutures Home later today vs am Will d/c Lopressor since pt had normal coronaries and BPs borderline.   LOS: 5 days    COLLINS,GINA H 06/30/2011   I have seen and examined the patient and agree with the assessment and plan as outlined.  Tentatively plan d/c in am.  Purcell Nails

## 2011-06-30 NOTE — Discharge Summary (Signed)
Discharge Summary  Name: [name] DOB: 1956-01-08 55 y.o. MRN: [id number]  Admission Date: 06/25/2011 Discharge Date:    Admitting Diagnosis: Active Problems:  Nontoxic multinodular goiter  HYPERLIPIDEMIA  AORTIC STENOSIS  GERD  S/P AVR (aortic valve replacement)   Discharge Diagnosis:  Active Problems:  Nontoxic multinodular goiter  HYPERLIPIDEMIA  AORTIC STENOSIS  GERD  S/P AVR (aortic valve replacement)   Procedures: Procedure(s): AORTIC VALVE REPLACEMENT (AVR) on 06/25/2011   HPI:  The patient is a 55 y.o. female who has had a cardiac murmur since childhood. She has been followed with serial 2-D echoes by Dr. Tonny Bollman. Her symptoms have been very minimal until recently when she developed dyspnea on exertion, decreased exercise tolerance, and some exertional chest tightness. She especially had difficulty climbing up the stairs at a football game recently and felt she was about to pass out. Her most recent 2-D echo by Dr. Excell Seltzer showed a significant increase in transvalvular gradient to a mean of 50 mm mercury. The valve appeared to be bicuspid. There is minimal aortic insufficiency or other valvular abnormalities. There is LVH with EF of 55%. The LV outflow tract measure 20 mmHg. Patient underwent a cardiac catheterization by Dr. Excell Seltzer last week. This demonstrated severe aortic stenosis. Right heart pressures were PA pressure 30/10 with a wedge of 7. Left ventricular end-diastolic pressure 15. EF was 70. Coronaries were normal. Cardiac output 4.7 L per minute. The aortic valve was calcified 1+ AI. There is no significant dilatation of the descending aorta. Valve area was 0.55 with a peak gradient of 68 mm mercury. She was referred to Dr. Donata Clay for discussion of aortic valve replacement. He felt that she would benefit from mechanical valve replacement and she was scheduled for outpatient admission for surgery on 06/25/2011.        Hospital Course:  The patient  was admitted to [facility] on 06/25/2011 for aortic valve replacement.  All risks, benefits and alternatives of surgery were explained in detail, and the patient agreed to proceed. The patient was taken to the operating room and underwent the above procedure.  The postoperative course was  Uneventful.  She was started on Coumadin and currently her INR is therapeutic at 2.5. She has been restarted on her home diabetes medications and her sugars are stable. She is maintaining sinus rhythm postoperatively. She was initially started on Lopressor however her blood pressures have been borderline and this has been discontinued.she was started on Lasix for volume overload and is diuresing well. She is ambulating in the halls without difficulty. She's tolerating a regular diet.    Recent vital signs:  Filed Vitals:   06/30/11 0450  BP: 114/67  Pulse: 91  Temp: 98.2 F (36.8 C)  Resp: 20    Recent laboratory studies:  CBC: Basename 06/29/11 0700 06/28/11 0704  WBC 13.1* 15.4*  HGB 9.8* 9.4*  HCT 29.9* 29.2*  PLT 232 185   BMET:  Basename 06/28/11 0704  NA 140  K 3.5  CL 101  CO2 31  GLUCOSE 116*  BUN 15  CREATININE 0.68  CALCIUM 8.7    PT/INR:  Basename 06/30/11 0835  LABPROT 28.0*  INR 2.57*   Diagnostic Studies: Dg Chest 2 View  06/28/2011  *RADIOLOGY REPORT*  Clinical Data: Post CABG, follow-up  CHEST - 2 VIEW  Comparison: Portable chest x-ray of 06/27/2011  Findings: The lungs appear slightly better aerated.  Basilar opacities persist consistent with atelectasis and effusions.  No pneumothorax is  seen.  Cardiomegaly is stable.  IMPRESSION: Slightly better aeration.  Persistent basilar opacities consistent with atelectasis and effusions.  Original Report Authenticated By: Juline Patch, M.D.    Discharge Medications:   Current Discharge Medication List    START taking these medications   Details  furosemide (LASIX) 40 MG tablet Take 1 tablet (40 mg total) by mouth  daily. Qty: 5 tablet, Refills: 0    potassium chloride SA (K-DUR,KLOR-CON) 20 MEQ tablet Take 1 tablet (20 mEq total) by mouth daily. Qty: 5 tablet, Refills: 0    traMADol (ULTRAM) 50 MG tablet Take 1 tablet (50 mg total) by mouth every 4 (four) hours as needed for pain. Maximum dose= 8 tablets per day Qty: 40 tablet, Refills: 0    warfarin (COUMADIN) 5 MG tablet Take 1 tablet (5 mg total) by mouth daily. Qty: 30 tablet, Refills: 1      CONTINUE these medications which have NOT CHANGED   Details  aspirin EC 81 MG tablet Take 1 tablet (81 mg total) by mouth daily. Qty: 150 tablet, Refills: 2    metFORMIN (GLUCOPHAGE) 500 MG tablet Take 1 tablet (500 mg total) by mouth daily with breakfast. Qty: 90 tablet, Refills: 3   Associated Diagnoses: Type II or unspecified type diabetes mellitus without mention of complication, uncontrolled    rosuvastatin (CRESTOR) 20 MG tablet Take 20 mg by mouth every evening.            Disposition: Home or Self Care  Discharge Orders    Future Appointments: Provider: Department: Dept Phone: Center:   07/22/2011 1:30 PM [facility] [phone] [facility]   09/27/2011 8:00 AM Oliver Barre, MD [facility] [phone] [facility]      Follow-up Information    Follow up with Tonny Bollman, MD. Make an appointment on 07/09/2011. (Call for appointment to have PT/INR drawn on Tuesday 07/09/2011; Patient also needs to call for a follow up appointment with Dr. Excell Seltzer for 2 weeks.)    Contact information:   [address], [address] [place] [address] [phone]       Follow up with Mikey Bussing, MD on 07/22/2011. (PA/LAT CXR to be done at 12:45 pm;Appointment with physician assistant is at 1:30 pm.)    Contact information:   [address] [place] [address] [phone]           Armentha Branagan H 06/30/2011, 11:50 AM

## 2011-07-01 LAB — GLUCOSE, CAPILLARY
Glucose-Capillary: 144 mg/dL — ABNORMAL HIGH (ref 70–99)
Glucose-Capillary: 134 mg/dL — ABNORMAL HIGH (ref 70–99)

## 2011-07-01 LAB — PROTIME-INR
INR: 3.15 — ABNORMAL HIGH (ref 0.00–1.49)
Prothrombin Time: 32.8 seconds — ABNORMAL HIGH (ref 11.6–15.2)

## 2011-07-01 MED ORDER — WARFARIN SODIUM 5 MG PO TABS: ORAL_TABLET | ORAL | Status: DC

## 2011-07-01 MED ORDER — INFLUENZA VIRUS VACC SPLIT PF IM SUSP: 0.5000 mL | INTRAMUSCULAR | Status: DC | PRN

## 2011-07-01 NOTE — Progress Notes (Signed)
CARDIAC REHAB PHASE I   PRE:  Rate/Rhythm: 98  BP:  Supine:   Sitting: 110/60  stnding:    SaO2: 91%  MODE:  Ambulation: 450 ft   POST:  Rate/Rhythem:   BP:  Supine:   Sitting:   Standing:    SaO2:   0935 - 1000  Pt ambulated hall with 1 assist steady. C/o back pain RN made aware. Pt back to straight back chair with family standing by.  Rosalie Doctor

## 2011-07-01 NOTE — Progress Notes (Signed)
6 Days Post-Op Procedure(s) (LRB): AORTIC VALVE REPLACEMENT (AVR) (N/A)  Subjective: INR not drawn yet, as the lab was unable to draw despite multiple attempts.  Pt feeling well.  No c/o  Objective: Vital signs in last 24 hours: Patient Vitals for the past 24 hrs:  BP Temp Temp src Pulse Resp SpO2  07/01/11 0437 106/71 mmHg 98.7 F (37.1 C) Oral 100  19  93 %  06/30/11 2052 115/75 mmHg 98.2 F (36.8 C) Oral 96  19  92 %  06/30/11 1307 109/65 mmHg 98.5 F (36.9 C) Oral 95  18  90 %  06/30/11 1230 110/69 mmHg - - 73  20  -  06/30/11 1145 109/58 mmHg - - 91  18  -  06/30/11 1130 120/63 mmHg - - 88  20  90 %  06/30/11 0800 113/60 mmHg 98.7 F (37.1 C) Oral 79  20  92 %   Current Weight  06/29/11 182 lb 1.6 oz (82.6 kg)    Hemodynamic parameters for last 24 hours:    Intake/Output from previous day: 11/18 0701 - 11/19 0700 In: 360 [P.O.:360] Out: 400 [Urine:400] Intake/Output this shift:    PHYSICAL EXAM:  Heart: RRR, good valve click Lungs: clear Wound: healing well Extremities: no edema   Lab Results: CBC: Basename 06/29/11 0700  WBC 13.1*  HGB 9.8*  HCT 29.9*  PLT 232   BMET: No results found for this basename: NA:2,K:2,CL:2,CO2:2,GLUCOSE:2,BUN:2,CREATININE:2,CALCIUM:2 in the last 72 hours  PT/INR:  Basename 06/30/11 0835  LABPROT 28.0*  INR 2.57*     Assessment/Plan: S/P Procedure(s) (LRB): AORTIC VALVE REPLACEMENT (AVR) (N/A) Will check PT/INR this am Hopefully if bloodwork ok, will plan d/c home today.   LOS: 6 days    Valeen Borys H 07/01/2011

## 2011-07-01 NOTE — Progress Notes (Signed)
UR Completed.   [name] [name] 07/01/2011  

## 2011-07-01 NOTE — Progress Notes (Signed)
Pt complaining of backpain 8/10. Pt states having slight burning when urinating thia am. Pt refuses anything for pain. PA, Coral Ceo notified. No new orders given.

## 2011-07-01 NOTE — Progress Notes (Signed)
Patient reviewed dc instructions and medications,  No questions at the time. Coumadin clinic and cardiologist scheduled by patient and she is aware. Understands risk of bleed and when to call md. Understands not to take coumadin tonight and start 07-02-2011.

## 2011-07-01 NOTE — Plan of Care (Signed)
Problem: Discharge Progression Outcomes Goal: Discharge plan in place and appropriate Outcome: Completed/Met Date Met:  07/01/11 Home with daughter Goal: Pain controlled with appropriate interventions Outcome: Completed/Met Date Met:  07/01/11 Pain level 3/10

## 2011-07-02 LAB — TYPE AND SCREEN
ABO/RH(D): A POS
Antibody Screen: NEGATIVE
Unit division: 0
Unit division: 0

## 2011-07-03 ENCOUNTER — Ambulatory Visit (INDEPENDENT_AMBULATORY_CARE_PROVIDER_SITE_OTHER): Payer: Commercial Managed Care - PPO | Admitting: *Deleted

## 2011-07-03 ENCOUNTER — Encounter: Payer: Self-pay | Admitting: Cardiothoracic Surgery

## 2011-07-03 DIAGNOSIS — Z952 Presence of prosthetic heart valve: Secondary | ICD-10-CM

## 2011-07-03 DIAGNOSIS — I359 Nonrheumatic aortic valve disorder, unspecified: Secondary | ICD-10-CM

## 2011-07-03 DIAGNOSIS — Z7901 Long term (current) use of anticoagulants: Secondary | ICD-10-CM | POA: Insufficient documentation

## 2011-07-05 ENCOUNTER — Encounter: Payer: Commercial Managed Care - PPO | Admitting: *Deleted

## 2011-07-10 ENCOUNTER — Ambulatory Visit (INDEPENDENT_AMBULATORY_CARE_PROVIDER_SITE_OTHER): Payer: Commercial Managed Care - PPO | Admitting: *Deleted

## 2011-07-10 DIAGNOSIS — Z7901 Long term (current) use of anticoagulants: Secondary | ICD-10-CM

## 2011-07-10 DIAGNOSIS — Z952 Presence of prosthetic heart valve: Secondary | ICD-10-CM

## 2011-07-10 DIAGNOSIS — I359 Nonrheumatic aortic valve disorder, unspecified: Secondary | ICD-10-CM

## 2011-07-10 LAB — POCT INR: INR: 5.2

## 2011-07-15 ENCOUNTER — Other Ambulatory Visit: Payer: Self-pay | Admitting: Cardiothoracic Surgery

## 2011-07-15 DIAGNOSIS — I359 Nonrheumatic aortic valve disorder, unspecified: Secondary | ICD-10-CM

## 2011-07-17 ENCOUNTER — Ambulatory Visit (INDEPENDENT_AMBULATORY_CARE_PROVIDER_SITE_OTHER): Payer: Commercial Managed Care - PPO | Admitting: *Deleted

## 2011-07-17 DIAGNOSIS — I359 Nonrheumatic aortic valve disorder, unspecified: Secondary | ICD-10-CM

## 2011-07-17 DIAGNOSIS — Z952 Presence of prosthetic heart valve: Secondary | ICD-10-CM

## 2011-07-17 DIAGNOSIS — Z7901 Long term (current) use of anticoagulants: Secondary | ICD-10-CM

## 2011-07-22 ENCOUNTER — Ambulatory Visit
Admission: RE | Admit: 2011-07-22 | Discharge: 2011-07-22 | Disposition: A | Discharge status: Home / Self Care | Payer: Commercial Managed Care - PPO | Source: Ambulatory Visit | Attending: Cardiothoracic Surgery | Admitting: Cardiothoracic Surgery

## 2011-07-22 ENCOUNTER — Ambulatory Visit (INDEPENDENT_AMBULATORY_CARE_PROVIDER_SITE_OTHER): Payer: Self-pay | Admitting: Physician Assistant

## 2011-07-22 VITALS — BP 128/82 | HR 106 | Resp 16 | Ht 62.0 in | Wt 172.0 lb

## 2011-07-22 DIAGNOSIS — I359 Nonrheumatic aortic valve disorder, unspecified: Secondary | ICD-10-CM

## 2011-07-22 DIAGNOSIS — Z09 Encounter for follow-up examination after completed treatment for conditions other than malignant neoplasm: Secondary | ICD-10-CM

## 2011-07-22 NOTE — Progress Notes (Signed)
  HPI:  Patient returns for routine postoperative follow-up having undergone AVR 06/25/2011 (mechanical valve) by Dr. Donata Clay. The patient's early postoperative recovery while in the hospital was notable for labile blood pressure. As a result,  Lopressor was discontinued. Since hospital discharge the patient reports she is ambulating fairly well (occasional brief shortness of breath Upon first ascending stairs) and she has this "weird hiccup extra breath on occasion".  She denies any shortness of breath or chest pain.   Current Outpatient Prescriptions  Medication Sig Dispense Refill  . aspirin EC 81 MG tablet Take 1 tablet (81 mg total) by mouth daily.  150 tablet  2  . furosemide (LASIX) 40 MG tablet Take 1 tablet (40 mg total) by mouth daily.  5 tablet  0  . metFORMIN (GLUCOPHAGE) 500 MG tablet Take 1 tablet (500 mg total) by mouth daily with breakfast.  90 tablet  3  . potassium chloride SA (K-DUR,KLOR-CON) 20 MEQ tablet Take 1 tablet (20 mEq total) by mouth daily.  5 tablet  0  . rosuvastatin (CRESTOR) 20 MG tablet Take 20 mg by mouth every evening.        . warfarin (COUMADIN) 5 MG tablet No Coumadin on 11/19, then start 1/2 tab (2.5 mg) daily or as directed beginning 11/20  30 tablet  1    Physical Exam Cardiovascular: Regular rate and rhythm; sharp valve click. Pulmonary: Clear to auscultation bilaterally; no rales, wheezes, or rhonchi. Abdomen: Soft, nontender, bowel sounds present . Extremities: No cyanosis, clubbing, or edema. Sternal wound: Clean, dry, well healed, no signs of infection.  Diagnostic Tests: PA and lateral chest x-ray done today shows probable fluid on the right and the minor fissure, improved aeration overall, and no pneumothorax.  Impression and Plan: Overall, patient is surgically stable status post AVR (mechanical valve). She's been having her PT/INR drawn on Wednesday. Her last INR was 1.5. Cardiology is adjusting her Coumadin accordingly. The patient  did have a question as to why a Soren and not a St. Jude mechanical valve was used during her surgery.She's going to return to see Dr. Zenaida Niece to right in a couple of weeks with a chest x-ray and he should be able to provide her an answer at that time. Patient was not discharged home on Lopressor secondary to labile blood pressure postoperatively. Her  blood pressure has improved she would most likely tolerate a beta blocker at this point. She has an ppointments to see cardiology on December 20 and I will defer to them. The patient was instructed to continue with sternal precautions i.e. no lifting more than 10 pounds for the next 3-4 weeks. She was inquiring about when she could return to work. Apparently, her work involves some lifting.She was instructed she may not return to work until after being seen and evaluated by Dr. Donata Clay. She has not been taking any narcotics for pain; only using Tylenol. As a result, the patient was instructed she may begin driving short distances i.e. 30 minutes or less during the day and she may gradually increase her frequency and  duration as tolerates over the next week. Finally, she has been contacted by cardiac rehabilitation and she was surgically cleared to participate.

## 2011-07-24 ENCOUNTER — Encounter: Payer: Commercial Managed Care - PPO | Admitting: *Deleted

## 2011-07-24 ENCOUNTER — Ambulatory Visit (INDEPENDENT_AMBULATORY_CARE_PROVIDER_SITE_OTHER): Payer: Commercial Managed Care - PPO | Admitting: *Deleted

## 2011-07-24 DIAGNOSIS — Z7901 Long term (current) use of anticoagulants: Secondary | ICD-10-CM

## 2011-07-24 DIAGNOSIS — Z952 Presence of prosthetic heart valve: Secondary | ICD-10-CM

## 2011-07-24 DIAGNOSIS — I359 Nonrheumatic aortic valve disorder, unspecified: Secondary | ICD-10-CM

## 2011-07-25 ENCOUNTER — Encounter (HOSPITAL_COMMUNITY): Payer: Self-pay

## 2011-07-25 ENCOUNTER — Encounter (HOSPITAL_COMMUNITY)
Admission: RE | Admit: 2011-07-25 | Discharge: 2011-07-25 | Disposition: A | Discharge status: Home / Self Care | Payer: Commercial Managed Care - PPO | Source: Ambulatory Visit | Attending: Cardiovascular Disease | Admitting: Cardiovascular Disease

## 2011-07-25 DIAGNOSIS — I509 Heart failure, unspecified: Secondary | ICD-10-CM | POA: Insufficient documentation

## 2011-07-25 DIAGNOSIS — Z5189 Encounter for other specified aftercare: Secondary | ICD-10-CM | POA: Insufficient documentation

## 2011-07-25 DIAGNOSIS — E785 Hyperlipidemia, unspecified: Secondary | ICD-10-CM | POA: Insufficient documentation

## 2011-07-25 DIAGNOSIS — Q231 Congenital insufficiency of aortic valve: Secondary | ICD-10-CM | POA: Insufficient documentation

## 2011-07-25 DIAGNOSIS — I517 Cardiomegaly: Secondary | ICD-10-CM | POA: Insufficient documentation

## 2011-07-25 DIAGNOSIS — K219 Gastro-esophageal reflux disease without esophagitis: Secondary | ICD-10-CM | POA: Insufficient documentation

## 2011-07-25 DIAGNOSIS — Z7982 Long term (current) use of aspirin: Secondary | ICD-10-CM | POA: Insufficient documentation

## 2011-07-25 DIAGNOSIS — E042 Nontoxic multinodular goiter: Secondary | ICD-10-CM | POA: Insufficient documentation

## 2011-07-25 DIAGNOSIS — M1990 Unspecified osteoarthritis, unspecified site: Secondary | ICD-10-CM | POA: Insufficient documentation

## 2011-07-25 DIAGNOSIS — Z954 Presence of other heart-valve replacement: Secondary | ICD-10-CM | POA: Insufficient documentation

## 2011-07-25 DIAGNOSIS — F411 Generalized anxiety disorder: Secondary | ICD-10-CM | POA: Insufficient documentation

## 2011-07-25 DIAGNOSIS — E119 Type 2 diabetes mellitus without complications: Secondary | ICD-10-CM | POA: Insufficient documentation

## 2011-07-29 ENCOUNTER — Encounter (HOSPITAL_COMMUNITY)
Admission: RE | Admit: 2011-07-29 | Discharge: 2011-07-29 | Disposition: A | Discharge status: Home / Self Care | Payer: Commercial Managed Care - PPO | Source: Ambulatory Visit | Attending: Cardiovascular Disease | Admitting: Cardiovascular Disease

## 2011-07-29 LAB — GLUCOSE, CAPILLARY: Glucose-Capillary: 114 mg/dL — ABNORMAL HIGH (ref 70–99)

## 2011-07-29 NOTE — Progress Notes (Signed)
Pt started cardiac rehab today.  Pt tolerated light exercise without difficulty. VSS. Telemetry-normal sinus rhythm.  Pt denies cp or dyspnea.  Pt oriented to routine and equipment.  Understanding verbalized.  Will continue to monitor.

## 2011-07-31 ENCOUNTER — Encounter (HOSPITAL_COMMUNITY)
Admission: RE | Admit: 2011-07-31 | Discharge: 2011-07-31 | Disposition: A | Discharge status: Home / Self Care | Payer: Commercial Managed Care - PPO | Source: Ambulatory Visit | Attending: Cardiovascular Disease | Admitting: Cardiovascular Disease

## 2011-07-31 LAB — GLUCOSE, CAPILLARY: Glucose-Capillary: 95 mg/dL (ref 70–99)

## 2011-07-31 NOTE — Progress Notes (Signed)
Pt with follow up appointment Thursday.  Rehab report scan into media manager for review for Dr. Excell Seltzer

## 2011-08-01 ENCOUNTER — Ambulatory Visit (INDEPENDENT_AMBULATORY_CARE_PROVIDER_SITE_OTHER): Payer: Commercial Managed Care - PPO | Admitting: *Deleted

## 2011-08-01 ENCOUNTER — Encounter: Payer: Self-pay | Admitting: Cardiovascular Disease

## 2011-08-01 ENCOUNTER — Ambulatory Visit (INDEPENDENT_AMBULATORY_CARE_PROVIDER_SITE_OTHER): Payer: Commercial Managed Care - PPO | Admitting: Cardiovascular Disease

## 2011-08-01 VITALS — BP 118/80 | HR 102 | Ht 62.0 in | Wt 175.0 lb

## 2011-08-01 DIAGNOSIS — I359 Nonrheumatic aortic valve disorder, unspecified: Secondary | ICD-10-CM

## 2011-08-01 DIAGNOSIS — Z7901 Long term (current) use of anticoagulants: Secondary | ICD-10-CM

## 2011-08-01 DIAGNOSIS — Z952 Presence of prosthetic heart valve: Secondary | ICD-10-CM

## 2011-08-01 LAB — POCT INR: INR: 2.7

## 2011-08-01 MED ORDER — METOPROLOL SUCCINATE ER 25 MG PO TB24: 25.0000 mg | ORAL_TABLET | Freq: Two times a day (BID) | ORAL | Status: DC

## 2011-08-01 NOTE — Progress Notes (Signed)
[name] 55 y.o. female      Nutrition Screen                                                                    YES  NO Do you live in a nursing home?  X   Do you eat out more than 3 times/week?    X If yes, how many times per week do you eat out?  Do you have food allergies?   X If yes, what are you allergic to?  Have you gained or lost more than 10 lbs without trying?               X If yes, how much weight have you lost and over what time period?  lbs gained or lost over  weeks/month  Do you want to lose weight?    X  If yes, what is a goal weight or amount of weight you would like to lose? 20 lb  Do you eat alone most of the time?   X   Do you eat less than 2 meals/day?  X If yes, how many meals do you eat?  Do you drink more than 3 alcohol drinks/day?  X If yes, how many drinks per day?  Are you having trouble with constipation? *  X If yes, what are you doing to help relieve constipation?  Do you have financial difficulties with buying food?*    X   Are you experiencing regular nausea/ vomiting?*     X   Do you have a poor appetite? *                                        X   Do you have trouble chewing/swallowing? *   X    Pt with diagnoses of:  XCHF  X GERD          X AVR/MVR/ICD      X DM/A1c >6 / CBG >126 X Dyslipidemia  / HDL< 40 / LDL>70 / High TG      X %  Body fat >goal / Body Mass Index >25       Pt Risk Score    1       Diagnosis Risk Score  120       Total Risk Score   121                        X High Risk                Low Risk              HT: 63" Ht Readings from Last 1 Encounters:  07/25/11 5\' 3"  (1.6 m)    WT:  176.2 lb (80.1 kg) Wt Readings from Last 3 Encounters:  07/25/11 176 lb 9.4 oz (80.1 kg)  07/22/11 172 lb (78.019 kg)  06/29/11 182 lb 1.6 oz (82.6 kg)     IBW 52.3 153%IBW BMI 31.3 43.8%body fat  Meds: Metformin, Warfarin Past Medical History  Diagnosis Date  . Hyperlipidemia   . Multinodular thyroid   .  GERD (gastroesophageal  reflux disease)   . Hx of hysterectomy   . Palpitation   . Aortic stenosis   . Heart murmur     aortic stenosis - dr. Excell Seltzer is her cardiologist  . Angina     chest tightness and pressure due to aortic stenosis  . Shortness of breath     associated with as  . Diabetes mellitus     newly diagnosed in july, 2012  . Headache     migraines when younger  . PONV (postoperative nausea and vomiting)        Activity level: Pt is moderately active  Wt goal: 152-164 lb ( 69.1-74.5 kg) Current tobacco use? No      Food/Drug Interaction? Yes If yes, pt given Food/Drug Interaction handout? Yes  Labs:  Lipid Panel     Component Value Date/Time   CHOL 176 03/18/2011 0743   TRIG 145.0 03/18/2011 0743   HDL 55.70 03/18/2011 0743   CHOLHDL 3 03/18/2011 0743   VLDL 29.0 03/18/2011 0743   LDLCALC 91 03/18/2011 0743     Lab Results  Component Value Date   HGBA1C 7.1* 06/21/2011    06/28/11 Glucose 116   LDL goal:    < 100      MI, DM, Carotid or PVD and > 2:      tobacco, HTN, HDL, family h/o, lipoprotein a     > 53 yo female or        >53 yo female   Estimated Daily Nutrition Needs for: ? wt loss 1300-1600 Kcal , Total Fat 35-40gm, Saturated Fat 10-12 gm, Trans Fat 1.3-1.6 gm,  Sodium less than 1500 mg, Grams of CHO 175

## 2011-08-01 NOTE — Patient Instructions (Signed)
Your physician has requested that you have an echocardiogram in February 2013. Echocardiography is a painless test that uses sound waves to create images of your heart. It provides your doctor with information about the size and shape of your heart and how well your heart's chambers and valves are working. This procedure takes approximately one hour. There are no restrictions for this procedure.  Your physician recommends that you schedule a follow-up appointment in: February 2013  Your physician has recommended you make the following change in your medication: INCREASE Metoprolol Succinate to 25mg  take one by mouth twice a day

## 2011-08-02 ENCOUNTER — Encounter (HOSPITAL_COMMUNITY)
Admission: RE | Admit: 2011-08-02 | Discharge: 2011-08-02 | Disposition: A | Discharge status: Home / Self Care | Payer: Commercial Managed Care - PPO | Source: Ambulatory Visit | Attending: Cardiovascular Disease | Admitting: Cardiovascular Disease

## 2011-08-02 LAB — GLUCOSE, CAPILLARY: Glucose-Capillary: 176 mg/dL — ABNORMAL HIGH (ref 70–99)

## 2011-08-07 ENCOUNTER — Other Ambulatory Visit: Payer: Self-pay | Admitting: Cardiothoracic Surgery

## 2011-08-07 DIAGNOSIS — I359 Nonrheumatic aortic valve disorder, unspecified: Secondary | ICD-10-CM

## 2011-08-14 ENCOUNTER — Ambulatory Visit
Admission: RE | Admit: 2011-08-14 | Discharge: 2011-08-14 | Disposition: A | Discharge status: Home / Self Care | Payer: Commercial Managed Care - PPO | Source: Ambulatory Visit | Attending: Cardiothoracic Surgery | Admitting: Cardiothoracic Surgery

## 2011-08-14 ENCOUNTER — Ambulatory Visit (INDEPENDENT_AMBULATORY_CARE_PROVIDER_SITE_OTHER): Payer: Commercial Managed Care - PPO | Admitting: *Deleted

## 2011-08-14 ENCOUNTER — Encounter: Payer: Self-pay | Admitting: Cardiothoracic Surgery

## 2011-08-14 ENCOUNTER — Ambulatory Visit (INDEPENDENT_AMBULATORY_CARE_PROVIDER_SITE_OTHER): Payer: Self-pay | Admitting: Cardiothoracic Surgery

## 2011-08-14 VITALS — BP 121/83 | HR 94 | Resp 18 | Ht 62.0 in | Wt 172.0 lb

## 2011-08-14 DIAGNOSIS — I359 Nonrheumatic aortic valve disorder, unspecified: Secondary | ICD-10-CM

## 2011-08-14 DIAGNOSIS — Z954 Presence of other heart-valve replacement: Secondary | ICD-10-CM

## 2011-08-14 DIAGNOSIS — Z952 Presence of prosthetic heart valve: Secondary | ICD-10-CM

## 2011-08-14 DIAGNOSIS — Z7901 Long term (current) use of anticoagulants: Secondary | ICD-10-CM

## 2011-08-14 NOTE — Progress Notes (Signed)
HPI:  56 year old woman presenting for hospital followup evaluation. The patient has a history of severe bicuspid aortic valve stenosis and underwent mechanical aortic valve replacement June 25, 2011. She had developed symptomatic AS with exertional angina and lightheadedness.  The patient has recovered well from surgery and reports marked clinical improvement. She is participating in cardiac rehabilitation. She reports mild dyspnea with exertion. She denies chest pain or tightness. She denies orthopnea, PND, palpitations, or edema.  Outpatient Encounter Prescriptions as of 08/01/2011  Medication Sig Dispense Refill  . acetaminophen (TYLENOL) 500 MG tablet Take 500 mg by mouth every 6 (six) hours as needed.        Marland Kitchen aspirin EC 81 MG tablet Take 1 tablet (81 mg total) by mouth daily.  150 tablet  2  . metFORMIN (GLUCOPHAGE) 500 MG tablet Take 1 tablet (500 mg total) by mouth daily with breakfast.  90 tablet  3  . metoprolol succinate (TOPROL-XL) 25 MG 24 hr tablet Take 1 tablet (25 mg total) by mouth 2 (two) times daily.  180 tablet  3  . rosuvastatin (CRESTOR) 20 MG tablet Take 20 mg by mouth every evening.        . warfarin (COUMADIN) 5 MG tablet No Coumadin on 11/19, then start 1/2 tab (2.5 mg) daily or as directed beginning 11/20  30 tablet  1  . DISCONTD: metoprolol succinate (TOPROL-XL) 25 MG 24 hr tablet Take 25 mg by mouth daily. Take 1/2 tablet BID       . DISCONTD: furosemide (LASIX) 40 MG tablet Take 1 tablet (40 mg total) by mouth daily.  5 tablet  0  . DISCONTD: potassium chloride SA (K-DUR,KLOR-CON) 20 MEQ tablet Take 1 tablet (20 mEq total) by mouth daily.  5 tablet  0    Allergies  Allergen Reactions  . Atorvastatin     REACTION: myalgias Lipitor  . Codeine     REACTION: N/V  . Morphine And Related Nausea And Vomiting  . Niacin     REACTION: flushing  . Percocet (Oxycodone-Acetaminophen) Other (See Comments)    "blisters in mouth took in 1995"    Past Medical History    Diagnosis Date  . Hyperlipidemia   . Multinodular thyroid   . GERD (gastroesophageal reflux disease)   . Hx of hysterectomy   . Palpitation   . Aortic stenosis   . Heart murmur     aortic stenosis - dr. Excell Seltzer is her cardiologist  . Angina     chest tightness and pressure due to aortic stenosis  . Shortness of breath     associated with as  . Diabetes mellitus     newly diagnosed in july, 2012  . Headache     migraines when younger  . PONV (postoperative nausea and vomiting)     ROS: Negative except as per HPI  BP 118/80  Pulse 102  Ht 5\' 2"  (1.575 m)  Wt 79.379 kg (175 lb)  BMI 32.01 kg/m2  PHYSICAL EXAM: Pt is alert and oriented, NAD HEENT: normal Neck: JVP - normal, carotids 2+= without bruits Chest: Well-healed surgical scar Lungs: CTA bilaterally CV: tachycardic and regular with a normal mechanical A2 closure sound Abd: soft, NT, Positive BS, no hepatomegaly Ext: no C/C/E, distal pulses intact and equal Skin: warm/dry no rash  EKG:  Sinus tachycardia 102 beats per minute, possible left atrial enlargement, cannot rule out age-indeterminate inferior MI, otherwise within normal limits.  ASSESSMENT AND PLAN:

## 2011-08-14 NOTE — Assessment & Plan Note (Signed)
The patient is progressing well following aortic valve replacement surgery. I recommended increasing her Toprol to 25 mg twice daily to treat tachycardia. She does not have any symptoms of volume depletion so I think it is appropriate to increase her beta blocker. She is tolerating anticoagulation with warfarin and also takes aspirin 81 mg. I would like to see her back in February with an echocardiogram to establish her post surgical baseline.

## 2011-08-14 NOTE — Progress Notes (Signed)
                   [address]            [name] [address]          [phone]     The patient returns for routine postop office visit after undergoing aortic valve replacement with a 19 mm mechanical [employer] valve for severe aortic stenosis. The patient has noticed a fairly dramatic improvement in exercise tolerance and has started outpatient cardiac rehabilitation. The surgical incision is healing well. She's had no complication with her Coumadin for dosing or bleeding problems.  Current Outpatient Prescriptions  Medication Sig Dispense Refill  . acetaminophen (TYLENOL) 500 MG tablet Take 500 mg by mouth every 6 (six) hours as needed.        Marland Kitchen aspirin EC 81 MG tablet Take 1 tablet (81 mg total) by mouth daily.  150 tablet  2  . metFORMIN (GLUCOPHAGE) 500 MG tablet Take 1 tablet (500 mg total) by mouth daily with breakfast.  90 tablet  3  . metoprolol succinate (TOPROL-XL) 25 MG 24 hr tablet Take 1 tablet (25 mg total) by mouth 2 (two) times daily.  180 tablet  3  . rosuvastatin (CRESTOR) 20 MG tablet Take 20 mg by mouth every evening.        . warfarin (COUMADIN) 5 MG tablet No Coumadin on 11/19, then start 1/2 tab (2.5 mg) daily or as directed beginning 11/20  30 tablet  1     Physical Exam: Vital signs: Blood pressure 121/80 pulse 94 and regular saturation room air 98% weight 172 height 5 foot 2 inches General appearance is pleasant middle-aged female no distress Thorax well-healed sternal incision clear breath sounds Cardiac normal rhythm normal valve click in systole no diastolic murmur no gallop Extremities no edema no tenderness  Diagnostic Tests: Chest x-ray today shows clear lung fields no pleural effusion sternal wires intact  Impression: Stable course 6 weeks following aVR for severe bicuspid aortic stenosis. We'll plan on seeing the patient back in one month to review return to work status. Antibiotic prophylaxis for dental procedures was  discussed with the patient.  Plan: Return in one month

## 2011-08-14 NOTE — Patient Instructions (Signed)
May lift up to 20lbs

## 2011-08-16 ENCOUNTER — Encounter (HOSPITAL_COMMUNITY)
Admission: RE | Admit: 2011-08-16 | Discharge: 2011-08-16 | Disposition: A | Discharge status: Home / Self Care | Payer: Commercial Managed Care - PPO | Source: Ambulatory Visit | Attending: Cardiovascular Disease | Admitting: Cardiovascular Disease

## 2011-08-16 DIAGNOSIS — Q231 Congenital insufficiency of aortic valve: Secondary | ICD-10-CM | POA: Insufficient documentation

## 2011-08-16 DIAGNOSIS — F411 Generalized anxiety disorder: Secondary | ICD-10-CM | POA: Insufficient documentation

## 2011-08-16 DIAGNOSIS — I517 Cardiomegaly: Secondary | ICD-10-CM | POA: Insufficient documentation

## 2011-08-16 DIAGNOSIS — K219 Gastro-esophageal reflux disease without esophagitis: Secondary | ICD-10-CM | POA: Insufficient documentation

## 2011-08-16 DIAGNOSIS — E119 Type 2 diabetes mellitus without complications: Secondary | ICD-10-CM | POA: Insufficient documentation

## 2011-08-16 DIAGNOSIS — E042 Nontoxic multinodular goiter: Secondary | ICD-10-CM | POA: Insufficient documentation

## 2011-08-16 DIAGNOSIS — M1990 Unspecified osteoarthritis, unspecified site: Secondary | ICD-10-CM | POA: Insufficient documentation

## 2011-08-16 DIAGNOSIS — Z954 Presence of other heart-valve replacement: Secondary | ICD-10-CM | POA: Insufficient documentation

## 2011-08-16 DIAGNOSIS — Z5189 Encounter for other specified aftercare: Secondary | ICD-10-CM | POA: Insufficient documentation

## 2011-08-16 DIAGNOSIS — Z7982 Long term (current) use of aspirin: Secondary | ICD-10-CM | POA: Insufficient documentation

## 2011-08-16 DIAGNOSIS — I509 Heart failure, unspecified: Secondary | ICD-10-CM | POA: Insufficient documentation

## 2011-08-16 DIAGNOSIS — E785 Hyperlipidemia, unspecified: Secondary | ICD-10-CM | POA: Insufficient documentation

## 2011-08-16 LAB — GLUCOSE, CAPILLARY
Glucose-Capillary: 164 mg/dL — ABNORMAL HIGH (ref 70–99)
Glucose-Capillary: 104 mg/dL — ABNORMAL HIGH (ref 70–99)

## 2011-08-19 ENCOUNTER — Encounter (HOSPITAL_COMMUNITY)
Admission: RE | Admit: 2011-08-19 | Discharge: 2011-08-19 | Disposition: A | Discharge status: Home / Self Care | Payer: Commercial Managed Care - PPO | Source: Ambulatory Visit | Attending: Cardiovascular Disease | Admitting: Cardiovascular Disease

## 2011-08-21 ENCOUNTER — Encounter (HOSPITAL_COMMUNITY)
Admission: RE | Admit: 2011-08-21 | Discharge: 2011-08-21 | Disposition: A | Discharge status: Home / Self Care | Payer: Commercial Managed Care - PPO | Source: Ambulatory Visit | Attending: Cardiovascular Disease | Admitting: Cardiovascular Disease

## 2011-08-23 ENCOUNTER — Encounter (HOSPITAL_COMMUNITY)
Admission: RE | Admit: 2011-08-23 | Discharge: 2011-08-23 | Disposition: A | Discharge status: Home / Self Care | Payer: Commercial Managed Care - PPO | Source: Ambulatory Visit | Attending: Cardiovascular Disease | Admitting: Cardiovascular Disease

## 2011-08-26 ENCOUNTER — Telehealth: Payer: Self-pay | Admitting: Cardiovascular Disease

## 2011-08-26 ENCOUNTER — Encounter (HOSPITAL_COMMUNITY)
Admission: RE | Admit: 2011-08-26 | Discharge: 2011-08-26 | Disposition: A | Discharge status: Home / Self Care | Payer: Commercial Managed Care - PPO | Source: Ambulatory Visit | Attending: Cardiovascular Disease | Admitting: Cardiovascular Disease

## 2011-08-26 NOTE — Progress Notes (Addendum)
Cardiac Rehab-pt tolerated exercise without difficulty, VSS during exercise. However post exercise pt c/o visual disturbances ie "skipping and squiggly lines" in field of vision. SX lasted several minutes, relieved with rest and drinking water.   Pt denies any other sx.  CBG- 111 (150 pre exercise reported at home)  BP- 94/64 HR-95.  Pt did not drink fluids during exercise today.  Pt encouraged to increase PO fluid intake.   Orthostatic vitals- BP-109/75 HR-79 sitting, 116/80 HR-82 standing.  Pt states she decreased metoprolol to 12.5mg  BID x2days last week due to fatigue, however resumed 25mg  BID when she experienced tachypalpitations.   Pt advised to call Dr. Earmon Phoenix office to discuss sx and medication dosage.  Exercise VS flow sheet scanned in for review.   Understanding verbalized-jr,rn

## 2011-08-26 NOTE — Telephone Encounter (Signed)
I spoke with the pt and reviewed her note from cardiac rehab.  Last week the pt decreased her Metoprolol to 12.5mg  bid due to fatigue for 2 days.  The pt went back to her normal dose of Metoprolol 25mg  bid after noticing a fast pulse.  I recommended that the pt try Metoprolol 25mg  in the morning and 12.5mg  in the evening for the next few days to see if this helps her fatigue.  If this does then the pt can try to decrease back to 12.5mg  bid.  The pt will continue to monitor her pulse and call the office with any other questions or concerns.

## 2011-08-26 NOTE — Progress Notes (Signed)
[name] 56 y.o. female Nutrition Note Spoke with pt.  Nutrition Plan and Nutrition Survey reviewed with pt.  Weight loss tips discussed.  Pt reports she has gained 20 lbs over the last year due to medical exercise restrictions.  Pt states she was able to maintain her wt with diet and exercise previously.  Pt diagnosed as a diabetic over the past year.  Pt hopes to manage diabetes through wt loss, diet and exercise.  Pt is currently following Step 2 Therapeutic Lifestyle Changes diet.  Pt states she is scheduled for an A1c recheck next week. Pt is taking Coumadin and is aware of the foods containing vitamin K.  Consistent vitamin K intake while on Coumadin reviewed.    Nutrition Diagnosis   Food-and nutrition-related knowledge deficit related to lack of exposure to information as related to diagnosis of: ? CVD ? DM (A1c 7.1)    Obesity related to excessive energy intake as evidenced by a BMI of 31.3  Nutrition RX/ Estimated Daily Nutrition Needs for: wt loss 1300-1600 Kcal, 35-40 gm fat, 10-12 gm sat fat, 1.3-1.6 gm trans-fat, <1500 mg sodium, 175 gm CHO   Nutrition Intervention   Pt's individual nutrition plan including cholesterol goals reviewed with pt.   Benefits of adopting Therapeutic Lifestyle Changes discussed when Medficts reviewed.   Pt to attend the Portion Distortion class   Pt to attend the  ? Nutrition I class                     ? Nutrition II class        ? Diabetes Blitz class       ? Diabetes Q & A class   Pt given handouts for: ? wt loss ? DM ? Consistent vit K diet ? low sodium     Continue client-centered nutrition education by RD, as part of interdisciplinary care. Goal(s)   Pt to identify food quantities necessary to achieve: ? wt loss to a goal wt of 152-164 lb (69.1-74.5 kg) at graduation from cardiac rehab.    Use pre-meal and post-meal CBG's and A1c to determine whether adjustments in food/meal planning will be beneficial or if any meds need to be combined with  nutrition therapy. Monitor and Evaluate progress toward nutrition goal with team.

## 2011-08-26 NOTE — Telephone Encounter (Signed)
Pt calling re cardiac rehab , had episode this am, to call and let us know

## 2011-08-27 ENCOUNTER — Other Ambulatory Visit: Payer: Self-pay | Admitting: Internal Medicine

## 2011-08-28 ENCOUNTER — Ambulatory Visit (INDEPENDENT_AMBULATORY_CARE_PROVIDER_SITE_OTHER): Payer: Commercial Managed Care - PPO | Admitting: *Deleted

## 2011-08-28 ENCOUNTER — Encounter (HOSPITAL_COMMUNITY)
Admission: RE | Admit: 2011-08-28 | Discharge: 2011-08-28 | Disposition: A | Discharge status: Home / Self Care | Payer: Commercial Managed Care - PPO | Source: Ambulatory Visit | Attending: Cardiovascular Disease | Admitting: Cardiovascular Disease

## 2011-08-28 DIAGNOSIS — I359 Nonrheumatic aortic valve disorder, unspecified: Secondary | ICD-10-CM

## 2011-08-28 DIAGNOSIS — Z7901 Long term (current) use of anticoagulants: Secondary | ICD-10-CM

## 2011-08-28 DIAGNOSIS — Z952 Presence of prosthetic heart valve: Secondary | ICD-10-CM

## 2011-08-30 ENCOUNTER — Encounter (HOSPITAL_COMMUNITY): Payer: Commercial Managed Care - PPO

## 2011-09-02 ENCOUNTER — Encounter (HOSPITAL_COMMUNITY)
Admission: RE | Admit: 2011-09-02 | Discharge: 2011-09-02 | Disposition: A | Discharge status: Home / Self Care | Payer: Commercial Managed Care - PPO | Source: Ambulatory Visit | Attending: Cardiovascular Disease | Admitting: Cardiovascular Disease

## 2011-09-03 ENCOUNTER — Other Ambulatory Visit (INDEPENDENT_AMBULATORY_CARE_PROVIDER_SITE_OTHER): Payer: Commercial Managed Care - PPO

## 2011-09-03 DIAGNOSIS — IMO0001 Reserved for inherently not codable concepts without codable children: Secondary | ICD-10-CM

## 2011-09-03 LAB — LIPID PANEL
Total CHOL/HDL Ratio: 3
VLDL: 42.8 mg/dL — ABNORMAL HIGH (ref 0.0–40.0)

## 2011-09-03 LAB — BASIC METABOLIC PANEL
BUN: 14 mg/dL (ref 6–23)
Chloride: 106 mEq/L (ref 96–112)
Potassium: 4.1 mEq/L (ref 3.5–5.1)

## 2011-09-03 LAB — HEMOGLOBIN A1C: Hgb A1c MFr Bld: 6.5 % (ref 4.6–6.5)

## 2011-09-03 LAB — LDL CHOLESTEROL, DIRECT: Direct LDL: 109.7 mg/dL

## 2011-09-04 ENCOUNTER — Encounter (HOSPITAL_COMMUNITY)
Admission: RE | Admit: 2011-09-04 | Discharge: 2011-09-04 | Disposition: A | Discharge status: Home / Self Care | Payer: Commercial Managed Care - PPO | Source: Ambulatory Visit | Attending: Cardiovascular Disease | Admitting: Cardiovascular Disease

## 2011-09-04 NOTE — Progress Notes (Signed)
Reviewed home exercise with pt today.  Pt plans to walk on home treadmill for 30 minutes on off days from outpatient cardiac rehab for exercise.  Reviewed THR, pulse, RPE, sign and symptoms, and when to call 911 or MD.  Pt voiced understanding. Harriett Sine [facility] 09/04/11 1238

## 2011-09-06 ENCOUNTER — Encounter (HOSPITAL_COMMUNITY)
Admission: RE | Admit: 2011-09-06 | Discharge: 2011-09-06 | Disposition: A | Discharge status: Home / Self Care | Payer: Commercial Managed Care - PPO | Source: Ambulatory Visit | Attending: Cardiovascular Disease | Admitting: Cardiovascular Disease

## 2011-09-09 ENCOUNTER — Encounter (HOSPITAL_COMMUNITY)
Admission: RE | Admit: 2011-09-09 | Discharge: 2011-09-09 | Disposition: A | Discharge status: Home / Self Care | Payer: Commercial Managed Care - PPO | Source: Ambulatory Visit | Attending: Cardiovascular Disease | Admitting: Cardiovascular Disease

## 2011-09-09 ENCOUNTER — Ambulatory Visit (INDEPENDENT_AMBULATORY_CARE_PROVIDER_SITE_OTHER): Payer: Commercial Managed Care - PPO | Admitting: Internal Medicine

## 2011-09-09 ENCOUNTER — Encounter: Payer: Self-pay | Admitting: Internal Medicine

## 2011-09-09 VITALS — BP 112/70 | HR 100 | Temp 97.9°F | Ht 62.0 in | Wt 178.1 lb

## 2011-09-09 DIAGNOSIS — IMO0001 Reserved for inherently not codable concepts without codable children: Secondary | ICD-10-CM

## 2011-09-09 DIAGNOSIS — Z0000 Encounter for general adult medical examination without abnormal findings: Secondary | ICD-10-CM

## 2011-09-09 DIAGNOSIS — E785 Hyperlipidemia, unspecified: Secondary | ICD-10-CM

## 2011-09-09 DIAGNOSIS — F411 Generalized anxiety disorder: Secondary | ICD-10-CM

## 2011-09-09 NOTE — Patient Instructions (Signed)
No changes in medication today Please continue to monitor and work on your diet, exercise (with the Cardiac Rehab), and weight control Please return in 6 mo with Lab testing done 3-5 days before

## 2011-09-09 NOTE — Assessment & Plan Note (Signed)
Uncontrolled, goal ldl < 70 ,  Lab Results  Component Value Date   LDLCALC 91 03/18/2011   Pt declines further tx at this time, cont crestor 20, for lower chol diet, consider zetia

## 2011-09-09 NOTE — Assessment & Plan Note (Signed)
stable overall by hx and exam, most recent data reviewed with pt, and pt to continue medical treatment as before  Lab Results  Component Value Date   WBC 13.1* 06/29/2011   HGB 9.8* 06/29/2011   HCT 29.9* 06/29/2011   PLT 232 06/29/2011   GLUCOSE 120* 09/03/2011   CHOL 184 09/03/2011   TRIG 214.0* 09/03/2011   HDL 52.90 09/03/2011   LDLDIRECT 109.7 09/03/2011   LDLCALC 91 03/18/2011   ALT 10 06/27/2011   AST 25 06/27/2011   NA 141 09/03/2011   K 4.1 09/03/2011   CL 106 09/03/2011   CREATININE 0.9 09/03/2011   BUN 14 09/03/2011   CO2 27 09/03/2011   TSH 1.64 03/18/2011   INR 2.4 08/28/2011   HGBA1C 6.5 09/03/2011   MICROALBUR 0.2 03/18/2011

## 2011-09-09 NOTE — Progress Notes (Signed)
Subjective:    Patient ID: [name], female    DOB: Jan 14, 1956, 56 y.o.   MRN: [id number]  HPI  Here to f/u; overall doing ok,  Pt denies chest pain, increased sob or doe, wheezing, orthopnea, PND, increased LE swelling, palpitations, dizziness or syncope.  Pt denies new neurological symptoms such as new headache, or facial or extremity weakness or numbness   Pt denies polydipsia, polyuria, or low sugar symptoms such as weakness or confusion improved with po intake.  Pt states overall good compliance with meds, trying to follow lower cholesterol  diet, wt overall stable but little exercise however.  Denies worsening depressive symptoms, suicidal ideation, or panic, though has ongoing anxiety, not increased recently.  Pt denies fever, wt loss, night sweats, loss of appetite, or other constitutional symptoms.  Overall good compliance with treatment, and good medicine tolerability. Continuing PT and cardiac rehab related to AoVR Jun 13, 2011. Cor's neg for plaque at the time. Past Medical History  Diagnosis Date  . Hyperlipidemia   . Multinodular thyroid   . GERD (gastroesophageal reflux disease)   . Hx of hysterectomy   . Palpitation   . Aortic stenosis   . Heart murmur     aortic stenosis - dr. Excell Seltzer is her cardiologist  . Angina     chest tightness and pressure due to aortic stenosis  . Shortness of breath     associated with as  . Diabetes mellitus     newly diagnosed in july, 2012  . Headache     migraines when younger  . PONV (postoperative nausea and vomiting)    Past Surgical History  Procedure Date  . Cesarean section   . Bunionectomy   . Partial hysterectomy   . Abdominal hysterectomy   . Aortic valve replacement 06/25/2011    Procedure: AORTIC VALVE REPLACEMENT (AVR);  Surgeon: Kathlee Nations Suann Larry, MD;  Location: [facility] OR;  Service: Open Heart Surgery;  Laterality: N/A;  . Cardiac valve replacement     Aortic Valve   06/25/2011    reports that she quit smoking about 17  years ago. Her smoking use included Cigarettes. She has a 15 pack-year smoking history. She has never used smokeless tobacco. She reports that she does not drink alcohol or use illicit drugs. family history includes COPD in her father; Cancer (age of onset:72) in her mother; Coronary artery disease in her father; Diabetes in her mother; and Hypertension in her other. Allergies  Allergen Reactions  . Atorvastatin     REACTION: myalgias Lipitor  . Codeine     REACTION: N/V  . Morphine And Related Nausea And Vomiting  . Niacin     REACTION: flushing  . Percocet (Oxycodone-Acetaminophen) Other (See Comments)    "blisters in mouth took in 1995"   Current Outpatient Prescriptions on File Prior to Visit  Medication Sig Dispense Refill  . aspirin EC 81 MG tablet Take 1 tablet (81 mg total) by mouth daily.  150 tablet  2  . CRESTOR 20 MG tablet TAKE 1 TABLET BY MOUTH 1 TIME DAILY  90 tablet  0  . metFORMIN (GLUCOPHAGE) 500 MG tablet Take 1 tablet (500 mg total) by mouth daily with breakfast.  90 tablet  3  . metoprolol succinate (TOPROL-XL) 25 MG 24 hr tablet Take 25 mg by mouth daily. Take 1 tab qAM and 1/2 tab qPM      . metoprolol succinate (TOPROL-XL) 25 MG 24 hr tablet Take 12.5 mg by mouth  at bedtime.      Marland Kitchen warfarin (COUMADIN) 5 MG tablet No Coumadin on 11/19, then start 1/2 tab (2.5 mg) daily or as directed beginning 11/20  30 tablet  1  . acetaminophen (TYLENOL) 500 MG tablet Take 500 mg by mouth every 6 (six) hours as needed.         Review of Systems Review of Systems  Constitutional: Negative for diaphoresis and unexpected weight change.  HENT: Negative for drooling and tinnitus.   Eyes: Negative for photophobia and visual disturbance.  Respiratory: Negative for choking and stridor.   Gastrointestinal: Negative for vomiting and blood in stool.  Genitourinary: Negative for hematuria and decreased urine volume.    Objective:   Physical Exam BP 112/70  Pulse 100  Temp(Src) 97.9  F (36.6 C) (Oral)  Ht 5\' 2"  (1.575 m)  Wt 178 lb 2 oz (80.797 kg)  BMI 32.58 kg/m2  SpO2 97% Physical Exam  VS noted, not ill appearing Constitutional: Pt appears well-developed and well-nourished.  HENT: Head: Normocephalic.  Right Ear: External ear normal.  Left Ear: External ear normal.  Eyes: Conjunctivae and EOM are normal. Pupils are equal, round, and reactive to light.  Neck: Normal range of motion. Neck supple.  Cardiovascular: Normal rate and regular rhythm.   Pulmonary/Chest: Effort normal and breath sounds normal.  Neurological: Pt is alert. No cranial nerve deficit.  Skin: Skin is warm. No erythema.  Psychiatric: Pt behavior is normal. Thought content normal.     Assessment & Plan:

## 2011-09-09 NOTE — Assessment & Plan Note (Signed)
stable overall by hx and exam, most recent data reviewed with pt, and pt to continue medical treatment as before Lab Results  Component Value Date   HGBA1C 6.5 09/03/2011    

## 2011-09-11 ENCOUNTER — Encounter (HOSPITAL_COMMUNITY)
Admission: RE | Admit: 2011-09-11 | Discharge: 2011-09-11 | Disposition: A | Discharge status: Home / Self Care | Payer: Commercial Managed Care - PPO | Source: Ambulatory Visit | Attending: Cardiovascular Disease | Admitting: Cardiovascular Disease

## 2011-09-11 ENCOUNTER — Other Ambulatory Visit: Payer: Self-pay | Admitting: *Deleted

## 2011-09-11 MED ORDER — WARFARIN SODIUM 5 MG PO TABS: 5.0000 mg | ORAL_TABLET | ORAL | Status: DC

## 2011-09-13 ENCOUNTER — Encounter (HOSPITAL_COMMUNITY)
Admission: RE | Admit: 2011-09-13 | Discharge: 2011-09-13 | Disposition: A | Discharge status: Home / Self Care | Payer: Commercial Managed Care - PPO | Source: Ambulatory Visit | Attending: Cardiovascular Disease | Admitting: Cardiovascular Disease

## 2011-09-13 DIAGNOSIS — Z954 Presence of other heart-valve replacement: Secondary | ICD-10-CM | POA: Insufficient documentation

## 2011-09-13 DIAGNOSIS — E042 Nontoxic multinodular goiter: Secondary | ICD-10-CM | POA: Insufficient documentation

## 2011-09-13 DIAGNOSIS — Z5189 Encounter for other specified aftercare: Secondary | ICD-10-CM | POA: Insufficient documentation

## 2011-09-13 DIAGNOSIS — E785 Hyperlipidemia, unspecified: Secondary | ICD-10-CM | POA: Insufficient documentation

## 2011-09-13 DIAGNOSIS — Z7982 Long term (current) use of aspirin: Secondary | ICD-10-CM | POA: Insufficient documentation

## 2011-09-13 DIAGNOSIS — E119 Type 2 diabetes mellitus without complications: Secondary | ICD-10-CM | POA: Insufficient documentation

## 2011-09-13 DIAGNOSIS — F411 Generalized anxiety disorder: Secondary | ICD-10-CM | POA: Insufficient documentation

## 2011-09-13 DIAGNOSIS — K219 Gastro-esophageal reflux disease without esophagitis: Secondary | ICD-10-CM | POA: Insufficient documentation

## 2011-09-13 DIAGNOSIS — I517 Cardiomegaly: Secondary | ICD-10-CM | POA: Insufficient documentation

## 2011-09-13 DIAGNOSIS — M1990 Unspecified osteoarthritis, unspecified site: Secondary | ICD-10-CM | POA: Insufficient documentation

## 2011-09-13 DIAGNOSIS — I509 Heart failure, unspecified: Secondary | ICD-10-CM | POA: Insufficient documentation

## 2011-09-13 DIAGNOSIS — Q231 Congenital insufficiency of aortic valve: Secondary | ICD-10-CM | POA: Insufficient documentation

## 2011-09-16 ENCOUNTER — Encounter (HOSPITAL_COMMUNITY)
Admission: RE | Admit: 2011-09-16 | Discharge: 2011-09-16 | Disposition: A | Discharge status: Home / Self Care | Payer: Commercial Managed Care - PPO | Source: Ambulatory Visit | Attending: Cardiovascular Disease | Admitting: Cardiovascular Disease

## 2011-09-18 ENCOUNTER — Encounter (HOSPITAL_COMMUNITY)
Admission: RE | Admit: 2011-09-18 | Discharge: 2011-09-18 | Disposition: A | Discharge status: Home / Self Care | Payer: Commercial Managed Care - PPO | Source: Ambulatory Visit | Attending: Cardiovascular Disease | Admitting: Cardiovascular Disease

## 2011-09-18 ENCOUNTER — Encounter: Payer: Self-pay | Admitting: Cardiothoracic Surgery

## 2011-09-18 ENCOUNTER — Ambulatory Visit (INDEPENDENT_AMBULATORY_CARE_PROVIDER_SITE_OTHER): Payer: Self-pay | Admitting: Cardiothoracic Surgery

## 2011-09-18 VITALS — BP 116/79 | HR 80 | Resp 20 | Ht 62.0 in | Wt 176.0 lb

## 2011-09-18 DIAGNOSIS — Z952 Presence of prosthetic heart valve: Secondary | ICD-10-CM

## 2011-09-18 DIAGNOSIS — I359 Nonrheumatic aortic valve disorder, unspecified: Secondary | ICD-10-CM

## 2011-09-18 DIAGNOSIS — Z954 Presence of other heart-valve replacement: Secondary | ICD-10-CM

## 2011-09-18 NOTE — Progress Notes (Signed)
HPI:                    [address]            [name] [address]          [phone]      The patient returns 2 months after aortic valve replacement for aortic stenosis doing well to discuss return to work issues. 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 She is participating cardiac rehabilitation. Her only symptoms are some mild chest wall muscular soreness and some mild palpitations at night. She's had no problems with her Coumadin or bleeding.  Current Outpatient Prescriptions  Medication Sig Dispense Refill  . acetaminophen (TYLENOL) 500 MG tablet Take 500 mg by mouth every 6 (six) hours as needed.        Marland Kitchen aspirin EC 81 MG tablet Take 1 tablet (81 mg total) by mouth daily.  150 tablet  2  . CRESTOR 20 MG tablet TAKE 1 TABLET BY MOUTH 1 TIME DAILY  90 tablet  0  . metFORMIN (GLUCOPHAGE) 500 MG tablet Take 1 tablet (500 mg total) by mouth daily with breakfast.  90 tablet  3  . metoprolol succinate (TOPROL-XL) 25 MG 24 hr tablet Take 25 mg by mouth daily. Take 1 tab qAM and 1/2 tab(12.5mg ) qPM      . warfarin (COUMADIN) 5 MG tablet Take 1 tablet (5 mg total) by mouth as directed.  30 tablet  3     Physical Exam: Vital signs blood pressure 117/80 pulse 80 saturation rare 98% weight 176 pounds Lungs clear incision well-healed Cardiac rhythm regular normal mechanical valve click without murmur of AI  Diagnostic Tests: None  Impression: The patient is 2 months after aVR and mobile to return to work and was giving a note to document that she should not lift more than 20 pounds until 3 months after surgery otherwise she is clear to proceed.  Plan: Return as needed. Endocarditis Information  You may be at risk for developing endocarditis since you have  an artificial heart valve  or a repaired heart valve. Endocarditis is an infection of the lining of the heart or heart valves.   Certain surgical and dental procedures may put you at risk,  such as teeth cleaning  or other dental procedures or any surgery involving the respiratory, urinary, gastrointestinal tract, gallbladder or prostate.   Notify your doctor or dentist before having any invasive procedures. You will need to take antibiotics before certain procedures.   To prevent endocarditis, maintain good oral health. Seek prompt medical attention for any mouth/gum, skin or urinary tract infections.

## 2011-09-19 ENCOUNTER — Encounter: Payer: Self-pay | Admitting: Cardiovascular Disease

## 2011-09-19 ENCOUNTER — Ambulatory Visit (HOSPITAL_COMMUNITY): Payer: Commercial Managed Care - PPO | Attending: Cardiovascular Disease | Admitting: Radiology

## 2011-09-19 ENCOUNTER — Ambulatory Visit (INDEPENDENT_AMBULATORY_CARE_PROVIDER_SITE_OTHER): Payer: Commercial Managed Care - PPO | Admitting: Cardiovascular Disease

## 2011-09-19 ENCOUNTER — Ambulatory Visit (INDEPENDENT_AMBULATORY_CARE_PROVIDER_SITE_OTHER): Payer: Commercial Managed Care - PPO | Admitting: *Deleted

## 2011-09-19 VITALS — BP 104/62 | HR 65 | Ht 62.0 in | Wt 176.0 lb

## 2011-09-19 DIAGNOSIS — Z7901 Long term (current) use of anticoagulants: Secondary | ICD-10-CM

## 2011-09-19 DIAGNOSIS — I359 Nonrheumatic aortic valve disorder, unspecified: Secondary | ICD-10-CM | POA: Insufficient documentation

## 2011-09-19 DIAGNOSIS — Z952 Presence of prosthetic heart valve: Secondary | ICD-10-CM

## 2011-09-19 DIAGNOSIS — Z954 Presence of other heart-valve replacement: Secondary | ICD-10-CM

## 2011-09-19 DIAGNOSIS — E785 Hyperlipidemia, unspecified: Secondary | ICD-10-CM

## 2011-09-19 LAB — POCT INR: INR: 2

## 2011-09-19 MED ORDER — METOPROLOL SUCCINATE ER 25 MG PO TB24: 25.0000 mg | ORAL_TABLET | Freq: Every day | ORAL | Status: DC

## 2011-09-19 NOTE — Patient Instructions (Addendum)
Your physician wants you to follow-up in: 6 MONTHS.  You will receive a reminder letter in the mail two months in advance. If you don't receive a letter, please call our office to schedule the follow-up appointment.  Your physician has recommended you make the following change in your medication: Please take Metoprolol Succinate 25mg  one every evening

## 2011-09-19 NOTE — Progress Notes (Signed)
HPI:  56 year old woman presenting for followup evaluation. The patient has a history of severe aortic stenosis and underwent mechanical aortic valve replacement in November 2012. She was treated with a 19 mm Sorin mechanical bileaflet valve. A followup echocardiogram done today showed normal LV function, no pericardial or regurgitation, and mean aortic valve gradient of 15 mm mercury.  The patient feels remarkably better since surgery. She is able to exercise without cardiopulmonary symptoms. She denies chest pain, dyspnea, lightheadedness, or edema. She has been participating in cardiac rehabilitation regularly.  Outpatient Encounter Prescriptions as of 09/19/2011  Medication Sig Dispense Refill  . acetaminophen (TYLENOL) 500 MG tablet Take 500 mg by mouth every 6 (six) hours as needed.        Marland Kitchen aspirin EC 81 MG tablet Take 1 tablet (81 mg total) by mouth daily.  150 tablet  2  . CRESTOR 20 MG tablet TAKE 1 TABLET BY MOUTH 1 TIME DAILY  90 tablet  0  . metFORMIN (GLUCOPHAGE) 500 MG tablet Take 1 tablet (500 mg total) by mouth daily with breakfast.  90 tablet  3  . metoprolol succinate (TOPROL-XL) 25 MG 24 hr tablet Take 1 tab qAM and 1/2 tab(12.5mg ) qPM      . warfarin (COUMADIN) 5 MG tablet Take 1 tablet (5 mg total) by mouth as directed.  30 tablet  3    Allergies  Allergen Reactions  . Atorvastatin     REACTION: myalgias Lipitor  . Codeine     REACTION: N/V  . Morphine And Related Nausea And Vomiting  . Niacin     REACTION: flushing  . Percocet (Oxycodone-Acetaminophen) Other (See Comments)    "blisters in mouth took in 1995"    Past Medical History  Diagnosis Date  . Hyperlipidemia   . Multinodular thyroid   . GERD (gastroesophageal reflux disease)   . Hx of hysterectomy   . Palpitation   . Aortic stenosis   . Heart murmur     aortic stenosis - dr. Excell Seltzer is her cardiologist  . Angina     chest tightness and pressure due to aortic stenosis  . Shortness of breath    associated with as  . Diabetes mellitus     newly diagnosed in july, 2012  . Headache     migraines when younger  . PONV (postoperative nausea and vomiting)     ROS: Negative except as per HPI  BP 104/62  Pulse 65  Ht 5\' 2"  (1.575 m)  Wt 79.833 kg (176 lb)  BMI 32.19 kg/m2  PHYSICAL EXAM: Pt is alert and oriented, NAD HEENT: normal Neck: JVP - normal, carotids 2+= without bruits Lungs: CTA bilaterally CV: RRR with soft systolic ejection murmur Abd: soft, NT, Positive BS, no hepatomegaly Ext: no C/C/E, distal pulses intact and equal Skin: warm/dry no rash  ASSESSMENT AND PLAN:

## 2011-09-20 ENCOUNTER — Encounter (HOSPITAL_COMMUNITY)
Admission: RE | Admit: 2011-09-20 | Discharge: 2011-09-20 | Disposition: A | Discharge status: Home / Self Care | Payer: Commercial Managed Care - PPO | Source: Ambulatory Visit | Attending: Cardiovascular Disease | Admitting: Cardiovascular Disease

## 2011-09-23 ENCOUNTER — Encounter (HOSPITAL_COMMUNITY): Payer: Commercial Managed Care - PPO

## 2011-09-24 NOTE — Assessment & Plan Note (Signed)
Lipids are at goal. The patient is on Crestor.

## 2011-09-24 NOTE — Assessment & Plan Note (Signed)
The patient is doing very well, now approaching 3 months out from cardiac surgery. Her symptoms have completely resolved. She'll continue on her current medical program which includes anticoagulation with warfarin. Her echocardiogram was reviewed and valve function appears normal. I suspect the mild increase in gradient is related to the fact that the patient had a small annulus and was treated with a 19 mm valve. We'll likely repeat an echo in one year.

## 2011-09-25 ENCOUNTER — Encounter (HOSPITAL_COMMUNITY)
Admission: RE | Admit: 2011-09-25 | Discharge: 2011-09-25 | Disposition: A | Discharge status: Home / Self Care | Payer: Commercial Managed Care - PPO | Source: Ambulatory Visit | Attending: Cardiovascular Disease | Admitting: Cardiovascular Disease

## 2011-09-26 ENCOUNTER — Other Ambulatory Visit (HOSPITAL_COMMUNITY): Payer: Commercial Managed Care - PPO | Admitting: Radiology

## 2011-09-26 ENCOUNTER — Ambulatory Visit: Payer: Commercial Managed Care - PPO | Admitting: Cardiovascular Disease

## 2011-09-27 ENCOUNTER — Encounter (HOSPITAL_COMMUNITY)
Admission: RE | Admit: 2011-09-27 | Discharge: 2011-09-27 | Disposition: A | Discharge status: Home / Self Care | Payer: Commercial Managed Care - PPO | Source: Ambulatory Visit | Attending: Cardiovascular Disease | Admitting: Cardiovascular Disease

## 2011-09-27 ENCOUNTER — Ambulatory Visit: Payer: Commercial Managed Care - PPO | Admitting: Internal Medicine

## 2011-09-30 ENCOUNTER — Encounter (HOSPITAL_COMMUNITY)
Admission: RE | Admit: 2011-09-30 | Discharge: 2011-09-30 | Disposition: A | Discharge status: Home / Self Care | Payer: Commercial Managed Care - PPO | Source: Ambulatory Visit | Attending: Cardiovascular Disease | Admitting: Cardiovascular Disease

## 2011-10-01 NOTE — Progress Notes (Signed)
Pt had increased PVC's during exercise today at cardiac rehab.  Pt denies symptoms with exercise however reports she has noticed increased palpitations at rest.  Pt denies dizziness/lightheadedness, chest pain or dyspnea.  Pt reports she decreased metoprolol per Dr. Excell Seltzer and she also started back to work this week which has increased her anxiety level.  Will continue to monitor.  If PVC increase further or pt palpitations become bothersome, will review with Dr. Excell Seltzer.  Pt made aware of this plan and verbalized understanding-jr,rn

## 2011-10-02 ENCOUNTER — Encounter (HOSPITAL_COMMUNITY)
Admission: RE | Admit: 2011-10-02 | Discharge: 2011-10-02 | Disposition: A | Discharge status: Home / Self Care | Payer: Commercial Managed Care - PPO | Source: Ambulatory Visit | Attending: Cardiovascular Disease | Admitting: Cardiovascular Disease

## 2011-10-02 ENCOUNTER — Encounter (HOSPITAL_COMMUNITY): Payer: Commercial Managed Care - PPO

## 2011-10-04 ENCOUNTER — Encounter (HOSPITAL_COMMUNITY): Payer: Commercial Managed Care - PPO

## 2011-10-04 ENCOUNTER — Encounter (HOSPITAL_COMMUNITY)
Admission: RE | Admit: 2011-10-04 | Discharge: 2011-10-04 | Disposition: A | Discharge status: Home / Self Care | Payer: Commercial Managed Care - PPO | Source: Ambulatory Visit | Attending: Cardiovascular Disease | Admitting: Cardiovascular Disease

## 2011-10-07 ENCOUNTER — Encounter (HOSPITAL_COMMUNITY)
Admission: RE | Admit: 2011-10-07 | Discharge: 2011-10-07 | Disposition: A | Discharge status: Home / Self Care | Payer: Commercial Managed Care - PPO | Source: Ambulatory Visit | Attending: Cardiovascular Disease | Admitting: Cardiovascular Disease

## 2011-10-07 ENCOUNTER — Encounter (HOSPITAL_COMMUNITY): Payer: Commercial Managed Care - PPO

## 2011-10-09 ENCOUNTER — Encounter (HOSPITAL_COMMUNITY)
Admission: RE | Admit: 2011-10-09 | Discharge: 2011-10-09 | Disposition: A | Discharge status: Home / Self Care | Payer: Commercial Managed Care - PPO | Source: Ambulatory Visit | Attending: Cardiovascular Disease | Admitting: Cardiovascular Disease

## 2011-10-09 ENCOUNTER — Encounter (HOSPITAL_COMMUNITY): Payer: Commercial Managed Care - PPO

## 2011-10-10 ENCOUNTER — Ambulatory Visit (INDEPENDENT_AMBULATORY_CARE_PROVIDER_SITE_OTHER): Payer: Commercial Managed Care - PPO

## 2011-10-10 DIAGNOSIS — Z7901 Long term (current) use of anticoagulants: Secondary | ICD-10-CM

## 2011-10-10 DIAGNOSIS — Z952 Presence of prosthetic heart valve: Secondary | ICD-10-CM

## 2011-10-10 DIAGNOSIS — I359 Nonrheumatic aortic valve disorder, unspecified: Secondary | ICD-10-CM

## 2011-10-10 DIAGNOSIS — Z954 Presence of other heart-valve replacement: Secondary | ICD-10-CM

## 2011-10-11 ENCOUNTER — Encounter (HOSPITAL_COMMUNITY): Payer: Commercial Managed Care - PPO

## 2011-10-11 ENCOUNTER — Encounter (HOSPITAL_COMMUNITY)
Admission: RE | Admit: 2011-10-11 | Discharge: 2011-10-11 | Disposition: A | Discharge status: Home / Self Care | Payer: Commercial Managed Care - PPO | Source: Ambulatory Visit | Attending: Cardiovascular Disease | Admitting: Cardiovascular Disease

## 2011-10-11 DIAGNOSIS — M1990 Unspecified osteoarthritis, unspecified site: Secondary | ICD-10-CM | POA: Insufficient documentation

## 2011-10-11 DIAGNOSIS — E119 Type 2 diabetes mellitus without complications: Secondary | ICD-10-CM | POA: Insufficient documentation

## 2011-10-11 DIAGNOSIS — I517 Cardiomegaly: Secondary | ICD-10-CM | POA: Insufficient documentation

## 2011-10-11 DIAGNOSIS — Z954 Presence of other heart-valve replacement: Secondary | ICD-10-CM | POA: Insufficient documentation

## 2011-10-11 DIAGNOSIS — E042 Nontoxic multinodular goiter: Secondary | ICD-10-CM | POA: Insufficient documentation

## 2011-10-11 DIAGNOSIS — Q231 Congenital insufficiency of aortic valve: Secondary | ICD-10-CM | POA: Insufficient documentation

## 2011-10-11 DIAGNOSIS — I509 Heart failure, unspecified: Secondary | ICD-10-CM | POA: Insufficient documentation

## 2011-10-11 DIAGNOSIS — E785 Hyperlipidemia, unspecified: Secondary | ICD-10-CM | POA: Insufficient documentation

## 2011-10-11 DIAGNOSIS — F411 Generalized anxiety disorder: Secondary | ICD-10-CM | POA: Insufficient documentation

## 2011-10-11 DIAGNOSIS — K219 Gastro-esophageal reflux disease without esophagitis: Secondary | ICD-10-CM | POA: Insufficient documentation

## 2011-10-11 DIAGNOSIS — Z7982 Long term (current) use of aspirin: Secondary | ICD-10-CM | POA: Insufficient documentation

## 2011-10-11 DIAGNOSIS — Z5189 Encounter for other specified aftercare: Secondary | ICD-10-CM | POA: Insufficient documentation

## 2011-10-14 ENCOUNTER — Encounter (HOSPITAL_COMMUNITY): Payer: Commercial Managed Care - PPO

## 2011-10-14 ENCOUNTER — Encounter (HOSPITAL_COMMUNITY)
Admission: RE | Admit: 2011-10-14 | Discharge: 2011-10-14 | Disposition: A | Discharge status: Home / Self Care | Payer: Commercial Managed Care - PPO | Source: Ambulatory Visit | Attending: Cardiovascular Disease | Admitting: Cardiovascular Disease

## 2011-10-16 ENCOUNTER — Encounter (HOSPITAL_COMMUNITY)
Admission: RE | Admit: 2011-10-16 | Discharge: 2011-10-16 | Disposition: A | Discharge status: Home / Self Care | Payer: Commercial Managed Care - PPO | Source: Ambulatory Visit | Attending: Cardiovascular Disease | Admitting: Cardiovascular Disease

## 2011-10-16 ENCOUNTER — Encounter (HOSPITAL_COMMUNITY): Payer: Commercial Managed Care - PPO

## 2011-10-18 ENCOUNTER — Encounter (HOSPITAL_COMMUNITY): Payer: Commercial Managed Care - PPO

## 2011-10-18 ENCOUNTER — Encounter (HOSPITAL_COMMUNITY)
Admission: RE | Admit: 2011-10-18 | Discharge: 2011-10-18 | Disposition: A | Discharge status: Home / Self Care | Payer: Commercial Managed Care - PPO | Source: Ambulatory Visit | Attending: Cardiovascular Disease | Admitting: Cardiovascular Disease

## 2011-10-21 ENCOUNTER — Encounter (HOSPITAL_COMMUNITY): Payer: Commercial Managed Care - PPO

## 2011-10-21 ENCOUNTER — Encounter (HOSPITAL_COMMUNITY)
Admission: RE | Admit: 2011-10-21 | Discharge: 2011-10-21 | Disposition: A | Discharge status: Home / Self Care | Payer: Commercial Managed Care - PPO | Source: Ambulatory Visit | Attending: Cardiovascular Disease | Admitting: Cardiovascular Disease

## 2011-10-23 ENCOUNTER — Encounter (HOSPITAL_COMMUNITY): Payer: Commercial Managed Care - PPO

## 2011-10-23 ENCOUNTER — Encounter (HOSPITAL_COMMUNITY)
Admission: RE | Admit: 2011-10-23 | Discharge: 2011-10-23 | Disposition: A | Discharge status: Home / Self Care | Payer: Commercial Managed Care - PPO | Source: Ambulatory Visit | Attending: Cardiovascular Disease | Admitting: Cardiovascular Disease

## 2011-10-25 ENCOUNTER — Encounter (HOSPITAL_COMMUNITY)
Admission: RE | Admit: 2011-10-25 | Discharge: 2011-10-25 | Disposition: A | Discharge status: Home / Self Care | Payer: Commercial Managed Care - PPO | Source: Ambulatory Visit | Attending: Cardiovascular Disease | Admitting: Cardiovascular Disease

## 2011-10-25 ENCOUNTER — Encounter (HOSPITAL_COMMUNITY): Payer: Commercial Managed Care - PPO

## 2011-10-28 ENCOUNTER — Encounter (HOSPITAL_COMMUNITY): Payer: Commercial Managed Care - PPO

## 2011-10-28 ENCOUNTER — Encounter (HOSPITAL_COMMUNITY)
Admission: RE | Admit: 2011-10-28 | Discharge: 2011-10-28 | Disposition: A | Discharge status: Home / Self Care | Payer: Commercial Managed Care - PPO | Source: Ambulatory Visit | Attending: Cardiovascular Disease | Admitting: Cardiovascular Disease

## 2011-10-30 ENCOUNTER — Encounter (HOSPITAL_COMMUNITY): Payer: Commercial Managed Care - PPO

## 2011-10-30 ENCOUNTER — Encounter (HOSPITAL_COMMUNITY)
Admission: RE | Admit: 2011-10-30 | Discharge: 2011-10-30 | Disposition: A | Discharge status: Home / Self Care | Payer: Commercial Managed Care - PPO | Source: Ambulatory Visit | Attending: Cardiovascular Disease | Admitting: Cardiovascular Disease

## 2011-11-01 ENCOUNTER — Encounter (HOSPITAL_COMMUNITY): Payer: Commercial Managed Care - PPO

## 2011-11-01 ENCOUNTER — Encounter (HOSPITAL_COMMUNITY)
Admission: RE | Admit: 2011-11-01 | Discharge: 2011-11-01 | Disposition: A | Discharge status: Home / Self Care | Payer: Commercial Managed Care - PPO | Source: Ambulatory Visit | Attending: Cardiovascular Disease | Admitting: Cardiovascular Disease

## 2011-11-04 ENCOUNTER — Telehealth: Payer: Self-pay | Admitting: Cardiovascular Disease

## 2011-11-04 ENCOUNTER — Encounter (HOSPITAL_COMMUNITY)
Admission: RE | Admit: 2011-11-04 | Discharge: 2011-11-04 | Disposition: A | Discharge status: Home / Self Care | Payer: Commercial Managed Care - PPO | Source: Ambulatory Visit | Attending: Cardiovascular Disease | Admitting: Cardiovascular Disease

## 2011-11-04 ENCOUNTER — Ambulatory Visit (HOSPITAL_COMMUNITY): Payer: Commercial Managed Care - PPO

## 2011-11-04 NOTE — Telephone Encounter (Signed)
Pt wants to talk about making sone changes to her metropolol she can get non extended release cheaper

## 2011-11-04 NOTE — Telephone Encounter (Signed)
Pt wants to change from metoprolol succinate to tartrate for cost purposes.  Will forward to Dr. Excell Seltzer for approval.

## 2011-11-05 MED ORDER — METOPROLOL TARTRATE 25 MG PO TABS: 12.5000 mg | ORAL_TABLET | Freq: Two times a day (BID) | ORAL | Status: DC

## 2011-11-05 NOTE — Telephone Encounter (Signed)
Will forward to Lauren 

## 2011-11-05 NOTE — Telephone Encounter (Signed)
This is fine. Her total daily dose of metoprolol should remain the same.

## 2011-11-05 NOTE — Telephone Encounter (Signed)
I spoke with the pt and made her aware that we can change her from metoprolol succinate to metoprolol tartrate.  Rx sent into the pharmacy.

## 2011-11-06 ENCOUNTER — Encounter (HOSPITAL_COMMUNITY): Payer: Commercial Managed Care - PPO

## 2011-11-06 ENCOUNTER — Ambulatory Visit (HOSPITAL_COMMUNITY): Payer: Commercial Managed Care - PPO

## 2011-11-07 ENCOUNTER — Ambulatory Visit (INDEPENDENT_AMBULATORY_CARE_PROVIDER_SITE_OTHER): Payer: Commercial Managed Care - PPO | Admitting: *Deleted

## 2011-11-07 DIAGNOSIS — I359 Nonrheumatic aortic valve disorder, unspecified: Secondary | ICD-10-CM

## 2011-11-07 DIAGNOSIS — Z952 Presence of prosthetic heart valve: Secondary | ICD-10-CM

## 2011-11-07 DIAGNOSIS — Z954 Presence of other heart-valve replacement: Secondary | ICD-10-CM

## 2011-11-07 DIAGNOSIS — Z7901 Long term (current) use of anticoagulants: Secondary | ICD-10-CM

## 2011-11-07 LAB — POCT INR: INR: 2.1

## 2011-11-08 ENCOUNTER — Encounter (HOSPITAL_COMMUNITY): Payer: Commercial Managed Care - PPO

## 2011-11-08 ENCOUNTER — Ambulatory Visit (HOSPITAL_COMMUNITY): Payer: Commercial Managed Care - PPO

## 2011-11-11 ENCOUNTER — Encounter (HOSPITAL_COMMUNITY): Payer: Commercial Managed Care - PPO

## 2011-11-11 ENCOUNTER — Ambulatory Visit (HOSPITAL_COMMUNITY): Payer: Commercial Managed Care - PPO

## 2011-11-13 ENCOUNTER — Encounter (HOSPITAL_COMMUNITY): Payer: Commercial Managed Care - PPO

## 2011-11-13 ENCOUNTER — Ambulatory Visit (HOSPITAL_COMMUNITY): Payer: Commercial Managed Care - PPO

## 2011-11-15 ENCOUNTER — Ambulatory Visit (HOSPITAL_COMMUNITY): Payer: Commercial Managed Care - PPO

## 2011-11-15 ENCOUNTER — Encounter (HOSPITAL_COMMUNITY): Payer: Commercial Managed Care - PPO

## 2011-11-18 ENCOUNTER — Ambulatory Visit (HOSPITAL_COMMUNITY): Payer: Commercial Managed Care - PPO

## 2011-11-18 ENCOUNTER — Encounter (HOSPITAL_COMMUNITY): Payer: Commercial Managed Care - PPO

## 2011-11-20 ENCOUNTER — Ambulatory Visit (HOSPITAL_COMMUNITY): Payer: Commercial Managed Care - PPO

## 2011-11-20 ENCOUNTER — Encounter (HOSPITAL_COMMUNITY): Payer: Commercial Managed Care - PPO

## 2011-11-22 ENCOUNTER — Encounter (HOSPITAL_COMMUNITY): Payer: Commercial Managed Care - PPO

## 2011-11-22 ENCOUNTER — Ambulatory Visit (HOSPITAL_COMMUNITY): Payer: Commercial Managed Care - PPO

## 2011-11-22 NOTE — Progress Notes (Signed)
Addendum to Nutrition Section of Cardiac Rehab Program Progress Report  Pt following a step 2 Therapeutic Lifestyle Changes diet.

## 2011-11-25 ENCOUNTER — Ambulatory Visit (HOSPITAL_COMMUNITY): Payer: Commercial Managed Care - PPO

## 2011-11-25 ENCOUNTER — Encounter (HOSPITAL_COMMUNITY): Payer: Commercial Managed Care - PPO

## 2011-11-27 ENCOUNTER — Encounter (HOSPITAL_COMMUNITY): Payer: Commercial Managed Care - PPO

## 2011-11-27 ENCOUNTER — Ambulatory Visit (HOSPITAL_COMMUNITY): Payer: Commercial Managed Care - PPO

## 2011-11-29 ENCOUNTER — Ambulatory Visit (HOSPITAL_COMMUNITY): Payer: Commercial Managed Care - PPO

## 2011-11-29 ENCOUNTER — Encounter (HOSPITAL_COMMUNITY): Payer: Commercial Managed Care - PPO

## 2011-12-05 ENCOUNTER — Ambulatory Visit (INDEPENDENT_AMBULATORY_CARE_PROVIDER_SITE_OTHER): Payer: Commercial Managed Care - PPO

## 2011-12-05 DIAGNOSIS — Z952 Presence of prosthetic heart valve: Secondary | ICD-10-CM

## 2011-12-05 DIAGNOSIS — Z7901 Long term (current) use of anticoagulants: Secondary | ICD-10-CM

## 2011-12-05 DIAGNOSIS — I359 Nonrheumatic aortic valve disorder, unspecified: Secondary | ICD-10-CM

## 2011-12-05 DIAGNOSIS — Z954 Presence of other heart-valve replacement: Secondary | ICD-10-CM

## 2011-12-05 LAB — POCT INR: INR: 2.1

## 2011-12-26 NOTE — Progress Notes (Signed)
Addendum to Cardiac Rehab Program Progress Report Pt completed exercise participation on 11/04/11.  Pt should be commended on her efforts toward heart healthy lifestyle.  Pt with zero hospitalizations during her participation.

## 2012-01-02 ENCOUNTER — Ambulatory Visit (INDEPENDENT_AMBULATORY_CARE_PROVIDER_SITE_OTHER): Payer: Commercial Managed Care - PPO

## 2012-01-02 DIAGNOSIS — Z954 Presence of other heart-valve replacement: Secondary | ICD-10-CM

## 2012-01-02 DIAGNOSIS — Z7901 Long term (current) use of anticoagulants: Secondary | ICD-10-CM

## 2012-01-02 DIAGNOSIS — Z952 Presence of prosthetic heart valve: Secondary | ICD-10-CM

## 2012-01-02 DIAGNOSIS — I359 Nonrheumatic aortic valve disorder, unspecified: Secondary | ICD-10-CM

## 2012-01-30 ENCOUNTER — Ambulatory Visit (INDEPENDENT_AMBULATORY_CARE_PROVIDER_SITE_OTHER): Payer: Commercial Managed Care - PPO

## 2012-01-30 DIAGNOSIS — Z954 Presence of other heart-valve replacement: Secondary | ICD-10-CM

## 2012-01-30 DIAGNOSIS — Z7901 Long term (current) use of anticoagulants: Secondary | ICD-10-CM

## 2012-01-30 DIAGNOSIS — Z952 Presence of prosthetic heart valve: Secondary | ICD-10-CM

## 2012-01-30 DIAGNOSIS — I359 Nonrheumatic aortic valve disorder, unspecified: Secondary | ICD-10-CM

## 2012-01-31 ENCOUNTER — Telehealth: Payer: Self-pay | Admitting: Internal Medicine

## 2012-01-31 ENCOUNTER — Ambulatory Visit (INDEPENDENT_AMBULATORY_CARE_PROVIDER_SITE_OTHER): Payer: Commercial Managed Care - PPO | Admitting: Internal Medicine

## 2012-01-31 ENCOUNTER — Telehealth: Payer: Self-pay

## 2012-01-31 ENCOUNTER — Telehealth: Payer: Self-pay | Admitting: Cardiovascular Disease

## 2012-01-31 ENCOUNTER — Encounter: Payer: Self-pay | Admitting: Internal Medicine

## 2012-01-31 VITALS — BP 110/70 | HR 77 | Temp 97.2°F | Ht 62.0 in | Wt 181.2 lb

## 2012-01-31 DIAGNOSIS — F411 Generalized anxiety disorder: Secondary | ICD-10-CM

## 2012-01-31 DIAGNOSIS — IMO0001 Reserved for inherently not codable concepts without codable children: Secondary | ICD-10-CM

## 2012-01-31 DIAGNOSIS — J019 Acute sinusitis, unspecified: Secondary | ICD-10-CM | POA: Insufficient documentation

## 2012-01-31 MED ORDER — LEVOFLOXACIN 250 MG PO TABS: 250.0000 mg | ORAL_TABLET | Freq: Every day | ORAL | Status: AC

## 2012-01-31 NOTE — Patient Instructions (Addendum)
Take all new medications as prescribed Continue all other medications as before Please return for any worsening fever, chills, cough, pain or other symptoms

## 2012-01-31 NOTE — Telephone Encounter (Signed)
New msg Pt has mechanical heart valve. She said she has a fever and stuffy nose. Does she need an antibiotic?

## 2012-01-31 NOTE — Telephone Encounter (Signed)
To see now

## 2012-01-31 NOTE — Telephone Encounter (Signed)
Caller: [name]/Patient; PCP: Oliver Barre; CB#: [phone];  Call regarding Fever  with Aortic Mechanical Valve; Onset 01/31/12.  Temp 99.5 tympanic at 1050;  Has not been feeling well for past few days with "soreness" at sternum "like its muscular or in the bone" since 01/29/12.  Pain not present unless moving. Valve replaced d/t bicuspid valve with calcification (aortic stenosis). FBS not tested. Cardiologist, Dr Copper suggested to be seen by PCP to r/o infection.  Advised to call 911 now for chest pain now or within last hr and known CAD per Chest Pain Guideline.  Declined 911; requests appt d/t Cardiologist recommendation.  No appts remain for 01/31/12.  Info noted and sent to MD and [facility] Pool.  Please call immediately.

## 2012-01-31 NOTE — Telephone Encounter (Signed)
APPT AT 2:00 TODAY.

## 2012-01-31 NOTE — Telephone Encounter (Signed)
Ok to take with coumadin this time

## 2012-01-31 NOTE — Telephone Encounter (Signed)
Pharmacy called to inform if ok to take the levaquin along with coumadin.  MD informed ok and callled pharmacy back spoke to [facility] informed ok per MD.

## 2012-01-31 NOTE — Telephone Encounter (Signed)
I spoke with the patient. She has a low grade fever (99.5), stuffy nose and scratchy throat. I have advised she call her PCP and follow up with them to make sure she does not have a bacterial infection. She is agreeable.

## 2012-02-01 ENCOUNTER — Encounter: Payer: Self-pay | Admitting: Internal Medicine

## 2012-02-01 NOTE — Assessment & Plan Note (Signed)
stable overall by hx and exam, most recent data reviewed with pt, and pt to continue medical treatment as before Lab Results  Component Value Date   HGBA1C 6.5 09/03/2011

## 2012-02-01 NOTE — Assessment & Plan Note (Addendum)
stable overall by hx and exam, most recent data reviewed with pt, and pt to continue medical treatment as before Lab Results  Component Value Date   WBC 13.1* 06/29/2011   HGB 9.8* 06/29/2011   HCT 29.9* 06/29/2011   PLT 232 06/29/2011   GLUCOSE 120* 09/03/2011   CHOL 184 09/03/2011   TRIG 214.0* 09/03/2011   HDL 52.90 09/03/2011   LDLDIRECT 109.7 09/03/2011   LDLCALC 91 03/18/2011   ALT 10 06/27/2011   AST 25 06/27/2011   NA 141 09/03/2011   K 4.1 09/03/2011   CL 106 09/03/2011   CREATININE 0.9 09/03/2011   BUN 14 09/03/2011   CO2 27 09/03/2011   TSH 1.64 03/18/2011   INR 2.1 01/30/2012   HGBA1C 6.5 09/03/2011   MICROALBUR 0.2 03/18/2011   Pt reassured today, declines f/u labs today

## 2012-02-01 NOTE — Progress Notes (Signed)
Subjective:    Patient ID: [name], female    DOB: 11-30-1955, 56 y.o.   MRN: [id number]  HPI  Here with acute visit, is s/p AVR and recalls what she perceived as a dire warning postop by surgury to be seen asap with any fever, or risk valve infection.  Has done well for many months, but here as she felt poorly for 2 days, then had temp this am at home 99.5, no chills, rash, joint pain or skin/nail changes;  Has occas sweats she thinks hormone related but not worsening.   Here with 3 days acute onset fever, facial pain, pressure, general weakness and malaise, and greenish d/c, with slight ST, but little to no cough and Pt denies chest pain, increased sob or doe, wheezing, orthopnea, PND, increased LE swelling, palpitations, dizziness or syncope. Pt denies new neurological symptoms such as new headache, or facial or extremity weakness or numbness   Pt denies polydipsia, polyuria. Past Medical History  Diagnosis Date  . Hyperlipidemia   . Multinodular thyroid   . GERD (gastroesophageal reflux disease)   . Hx of hysterectomy   . Palpitation   . Aortic stenosis   . Heart murmur     aortic stenosis - dr. Excell Seltzer is her cardiologist  . Angina     chest tightness and pressure due to aortic stenosis  . Shortness of breath     associated with as  . Diabetes mellitus     newly diagnosed in july, 2012  . Headache     migraines when younger  . PONV (postoperative nausea and vomiting)    Past Surgical History  Procedure Date  . Cesarean section   . Bunionectomy   . Partial hysterectomy   . Abdominal hysterectomy   . Aortic valve replacement 06/25/2011    Procedure: AORTIC VALVE REPLACEMENT (AVR);  Surgeon: Kathlee Nations Suann Larry, MD;  Location: [facility] OR;  Service: Open Heart Surgery;  Laterality: N/A;  . Cardiac valve replacement     Aortic Valve   06/25/2011    reports that she quit smoking about 17 years ago. Her smoking use included Cigarettes. She has a 15 pack-year smoking history. She  has never used smokeless tobacco. She reports that she does not drink alcohol or use illicit drugs. family history includes COPD in her father; Cancer (age of onset:72) in her mother; Coronary artery disease in her father; Diabetes in her mother; and Hypertension in her other. Allergies  Allergen Reactions  . Atorvastatin     REACTION: myalgias Lipitor  . Codeine     REACTION: N/V  . Morphine And Related Nausea And Vomiting  . Niacin     REACTION: flushing  . Percocet (Oxycodone-Acetaminophen) Other (See Comments)    "blisters in mouth took in 1995"   Review of Systems Review of Systems  Constitutional: Negative for diaphoresis and unexpected weight change.  Eyes: Negative for photophobia and visual disturbance.  Respiratory: Negative for choking and stridor.   Gastrointestinal: Negative for vomiting and blood in stool.  Genitourinary: Negative for hematuria and decreased urine volume.  Musculoskeletal: Negative for gait problem.  Skin: Negative for color change and wound.  Neurological: Negative for tremors and numbness.    Objective:   Physical Exam BP 110/70  Pulse 77  Temp 97.2 F (36.2 C) (Oral)  Ht 5\' 2"  (1.575 m)  Wt 181 lb 4 oz (82.214 kg)  BMI 33.15 kg/m2  SpO2 97% Physical Exam  VS noted Constitutional: Pt appears well-developed  and well-nourished.  HENT: Head: Normocephalic.  Right Ear: External ear normal.  Left Ear: External ear normal.  Bilat tm's mild erythema.  Sinus tender right > left.  Pharynx mild erythema Eyes: Conjunctivae and EOM are normal. Pupils are equal, round, and reactive to light.  Neck: Normal range of motion. Neck supple.  Cardiovascular: Normal rate and regular rhythm.   Pulmonary/Chest: Effort normal and breath sounds normal.  Neurological: Pt is alert. Not confused Skin: Skin is warm. No erythema.  Psychiatric: Pt behavior is normal. Thought content normal. 1+ nervous    Assessment & Plan:

## 2012-02-01 NOTE — Assessment & Plan Note (Signed)
Mild to mod, for antibx course,  to f/u any worsening symptoms or concerns 

## 2012-02-03 ENCOUNTER — Telehealth: Payer: Self-pay

## 2012-02-03 MED ORDER — AZITHROMYCIN 250 MG PO TABS: ORAL_TABLET | ORAL | Status: AC

## 2012-02-03 NOTE — Telephone Encounter (Signed)
Pt called stating that [facility] also advised against use of Levaquin with coumadin use. Pt is requesting alternative - Augmentin or zpack per [facility], please advise.

## 2012-02-03 NOTE — Telephone Encounter (Signed)
Done per emr 

## 2012-02-03 NOTE — Telephone Encounter (Signed)
Called left detailed message that prescription has been changed and sent to pharmacy. Called patients cell number and did speak to the patient informed of prescription.

## 2012-02-12 ENCOUNTER — Other Ambulatory Visit: Payer: Self-pay | Admitting: Internal Medicine

## 2012-02-25 ENCOUNTER — Telehealth: Payer: Self-pay | Admitting: Cardiovascular Disease

## 2012-02-25 NOTE — Telephone Encounter (Signed)
PT HEART SURGERY IN November, HAVING SOME DISCOMFORT AND "POPPING" IN HER CHEST, PLS ADVISE

## 2012-02-25 NOTE — Telephone Encounter (Signed)
CABG - November, c/o sternum bone pain /ache, started 2 weeks ago and is constant, 5/10 pain reduced to 2/10 with tylenol. Also states she hears  a popping noise with turning. Denies cp,sob, redness at site, no fever, but feels tender like a bruise when palpated but that has been consistent since CABG. Pt has August 13 app w/ Dr Excell Seltzer. Told pt I will inform DR/RN, no need for ASAP appointment currently,  told her to continue with tylenol and encouraged her to call anytime with further questions or concerns or changes, pt agreed to plan.

## 2012-02-26 ENCOUNTER — Ambulatory Visit (INDEPENDENT_AMBULATORY_CARE_PROVIDER_SITE_OTHER): Payer: Commercial Managed Care - PPO | Admitting: *Deleted

## 2012-02-26 DIAGNOSIS — I359 Nonrheumatic aortic valve disorder, unspecified: Secondary | ICD-10-CM

## 2012-02-26 DIAGNOSIS — Z954 Presence of other heart-valve replacement: Secondary | ICD-10-CM

## 2012-02-26 DIAGNOSIS — Z952 Presence of prosthetic heart valve: Secondary | ICD-10-CM

## 2012-02-26 DIAGNOSIS — Z7901 Long term (current) use of anticoagulants: Secondary | ICD-10-CM

## 2012-02-27 NOTE — Telephone Encounter (Signed)
I spoke with the pt and made her aware that Dr Excell Seltzer would like her to contact [facility].  The pt agreed with plan.

## 2012-02-27 NOTE — Telephone Encounter (Signed)
For sternal pain/popping should contact the surgical office.

## 2012-02-29 ENCOUNTER — Other Ambulatory Visit: Payer: Self-pay | Admitting: Cardiovascular Disease

## 2012-03-02 ENCOUNTER — Ambulatory Visit (INDEPENDENT_AMBULATORY_CARE_PROVIDER_SITE_OTHER)
Admission: RE | Admit: 2012-03-02 | Discharge: 2012-03-02 | Disposition: A | Discharge status: Home / Self Care | Payer: Commercial Managed Care - PPO | Source: Ambulatory Visit | Attending: Cardiovascular Disease | Admitting: Cardiovascular Disease

## 2012-03-02 ENCOUNTER — Telehealth: Payer: Self-pay | Admitting: Cardiovascular Disease

## 2012-03-02 DIAGNOSIS — I359 Nonrheumatic aortic valve disorder, unspecified: Secondary | ICD-10-CM

## 2012-03-02 NOTE — Telephone Encounter (Signed)
I spoke with the pt and she cannot see [facility] until 03/18/12.  The pt states that yesterday she had a cramp across her chest that went down both arms.  The pt said this was excruciating and felt similar to a cramp in the calf.  The feeling lasted about 5 minutes.  Today the pt's chest is sore. The pt will have a chest x-ray performed.

## 2012-03-02 NOTE — Telephone Encounter (Signed)
Please return call to patient [phone]   Patient had valve replacement in November 2012, currently having minor chest discomfort. No SOB or Dizziness at this time

## 2012-03-03 NOTE — Telephone Encounter (Signed)
I spoke with the pt about her chest x-ray results.  I have scheduled the pt to come into the office on 03/11/12 for evaluation by Dr Excell Seltzer.

## 2012-03-04 ENCOUNTER — Other Ambulatory Visit (INDEPENDENT_AMBULATORY_CARE_PROVIDER_SITE_OTHER): Payer: Commercial Managed Care - PPO

## 2012-03-04 DIAGNOSIS — Z0000 Encounter for general adult medical examination without abnormal findings: Secondary | ICD-10-CM

## 2012-03-04 DIAGNOSIS — IMO0001 Reserved for inherently not codable concepts without codable children: Secondary | ICD-10-CM

## 2012-03-04 LAB — HEPATIC FUNCTION PANEL
ALT: 17 U/L (ref 0–35)
AST: 25 U/L (ref 0–37)
Total Bilirubin: 0.6 mg/dL (ref 0.3–1.2)

## 2012-03-04 LAB — CBC WITH DIFFERENTIAL/PLATELET
Basophils Absolute: 0.1 10*3/uL (ref 0.0–0.1)
Eosinophils Absolute: 0.2 10*3/uL (ref 0.0–0.7)
Eosinophils Relative: 1.9 % (ref 0.0–5.0)
MCHC: 33.5 g/dL (ref 30.0–36.0)
MCV: 89.4 fl (ref 78.0–100.0)
Monocytes Absolute: 0.9 10*3/uL (ref 0.1–1.0)
Neutrophils Relative %: 55.5 % (ref 43.0–77.0)
Platelets: 226 10*3/uL (ref 150.0–400.0)
WBC: 8.9 10*3/uL (ref 4.5–10.5)

## 2012-03-04 LAB — URINALYSIS, ROUTINE W REFLEX MICROSCOPIC
Bilirubin Urine: NEGATIVE
Leukocytes, UA: NEGATIVE
Nitrite: NEGATIVE
Urobilinogen, UA: 0.2 (ref 0.0–1.0)

## 2012-03-04 LAB — LIPID PANEL: VLDL: 58 mg/dL — ABNORMAL HIGH (ref 0.0–40.0)

## 2012-03-04 LAB — HEMOGLOBIN A1C: Hgb A1c MFr Bld: 7.3 % — ABNORMAL HIGH (ref 4.6–6.5)

## 2012-03-04 LAB — TSH: TSH: 2 u[IU]/mL (ref 0.35–5.50)

## 2012-03-04 LAB — BASIC METABOLIC PANEL
BUN: 14 mg/dL (ref 6–23)
Chloride: 101 mEq/L (ref 96–112)
Potassium: 4.8 mEq/L (ref 3.5–5.1)

## 2012-03-09 ENCOUNTER — Ambulatory Visit (INDEPENDENT_AMBULATORY_CARE_PROVIDER_SITE_OTHER): Payer: Commercial Managed Care - PPO | Admitting: Internal Medicine

## 2012-03-09 ENCOUNTER — Encounter: Payer: Self-pay | Admitting: Internal Medicine

## 2012-03-09 VITALS — BP 112/68 | HR 78 | Temp 97.7°F | Ht 62.0 in | Wt 185.1 lb

## 2012-03-09 DIAGNOSIS — IMO0001 Reserved for inherently not codable concepts without codable children: Secondary | ICD-10-CM

## 2012-03-09 DIAGNOSIS — Z0000 Encounter for general adult medical examination without abnormal findings: Secondary | ICD-10-CM

## 2012-03-09 NOTE — Patient Instructions (Addendum)
Continue all other medications as before Please continue your efforts at being more active, low cholesterol/diabetic diet, and weight control. Please have the pharmacy call with any refills you may need. Please keep your appointments with your specialists as you have planned You are otherwise up to date with prevention Please return in 6 mo with Lab testing done 3-5 days before

## 2012-03-09 NOTE — Assessment & Plan Note (Signed)

## 2012-03-09 NOTE — Assessment & Plan Note (Signed)
Mild increased a1c, but pt to work on diet, declines med change

## 2012-03-09 NOTE — Progress Notes (Signed)
Subjective:    Patient ID: [name], female    DOB: [birth year], 56 y.o.   MRN: [id number]  HPI  Here for wellness and f/u;  Overall doing ok;  Pt denies CP, worsening SOB, DOE, wheezing, orthopnea, PND, worsening LE edema, palpitations, dizziness or syncope.  Pt denies neurological change such as new Headache, facial or extremity weakness.  Pt denies polydipsia, polyuria, or low sugar symptoms. Pt states overall good compliance with treatment and medications, good tolerability, and trying to follow lower cholesterol diet.  Pt denies worsening depressive symptoms, suicidal ideation or panic. No fever, wt loss, night sweats, loss of appetite, or other constitutional symptoms.  Pt states good ability with ADL's, low fall risk, home safety reviewed and adequate, no significant changes in hearing or vision, and occasionally active with exercise.  No acute symtpoms or complaints.  Needs med refills Past Medical History  Diagnosis Date  . Hyperlipidemia   . Multinodular thyroid   . GERD (gastroesophageal reflux disease)   . Hx of hysterectomy   . Palpitation   . Aortic stenosis   . Heart murmur     aortic stenosis - dr. Excell Seltzer is her cardiologist  . Angina     chest tightness and pressure due to aortic stenosis  . Shortness of breath     associated with as  . Diabetes mellitus     newly diagnosed in july, 2012  . Headache     migraines when younger  . PONV (postoperative nausea and vomiting)    Past Surgical History  Procedure Date  . Cesarean section   . Bunionectomy   . Partial hysterectomy   . Abdominal hysterectomy   . Aortic valve replacement 06/25/2011    Procedure: AORTIC VALVE REPLACEMENT (AVR);  Surgeon: Kathlee Nations Suann Larry, MD;  Location: [facility] OR;  Service: Open Heart Surgery;  Laterality: N/A;  . Cardiac valve replacement     Aortic Valve   06/25/2011    reports that she quit smoking about 17 years ago. Her smoking use included Cigarettes. She has a 15 pack-year smoking  history. She has never used smokeless tobacco. She reports that she does not drink alcohol or use illicit drugs. family history includes COPD in her father; Cancer (age of onset:72) in her mother; Coronary artery disease in her father; Diabetes in her mother; and Hypertension in her other. Allergies  Allergen Reactions  . Atorvastatin     REACTION: myalgias Lipitor  . Codeine     REACTION: N/V  . Morphine And Related Nausea And Vomiting  . Niacin     REACTION: flushing  . Percocet (Oxycodone-Acetaminophen) Other (See Comments)    "blisters in mouth took in 1995"   Current Outpatient Prescriptions on File Prior to Visit  Medication Sig Dispense Refill  . acetaminophen (TYLENOL) 500 MG tablet Take 500 mg by mouth every 6 (six) hours as needed.        Marland Kitchen aspirin EC 81 MG tablet Take 1 tablet (81 mg total) by mouth daily.  150 tablet  2  . CRESTOR 20 MG tablet TAKE 1 TABLET BY MOUTH ONCE A DAY  90 tablet  3  . metFORMIN (GLUCOPHAGE) 500 MG tablet Take 1 tablet (500 mg total) by mouth daily with breakfast.  90 tablet  3  . metoprolol tartrate (LOPRESSOR) 25 MG tablet Take 0.5 tablets (12.5 mg total) by mouth 2 (two) times daily.  90 tablet  3  . warfarin (COUMADIN) 5 MG tablet TAKE ONE TABLET  BY MOUTH EVERY DAY AS DIRECTED  30 tablet  3  . DISCONTD: metoprolol succinate (TOPROL-XL) 25 MG 24 hr tablet Take 1 tablet (25 mg total) by mouth at bedtime.  30 tablet  11   Review of Systems .Review of Systems  Constitutional: Negative for diaphoresis, activity change, appetite change and unexpected weight change.  HENT: Negative for hearing loss, ear pain, facial swelling, mouth sores and neck stiffness.   Eyes: Negative for pain, redness and visual disturbance.  Respiratory: Negative for shortness of breath and wheezing.   Cardiovascular: Negative for chest pain and palpitations.  Gastrointestinal: Negative for diarrhea, blood in stool, abdominal distention and rectal pain.  Genitourinary:  Negative for hematuria, flank pain and decreased urine volume.  Musculoskeletal: Negative for myalgias and joint swelling.  Skin: Negative for color change and wound.  Neurological: Negative for syncope and numbness.  Hematological: Negative for adenopathy.  Psychiatric/Behavioral: Negative for hallucinations, self-injury, decreased concentration and agitation.      Objective:   Physical Exam BP 112/68  Pulse 78  Temp 97.7 F (36.5 C) (Oral)  Ht 5\' 2"  (1.575 m)  Wt 185 lb 2 oz (83.972 kg)  BMI 33.86 kg/m2  SpO2 95% Physical Exam  VS noted Constitutional: Pt is oriented to person, place, and time. Appears well-developed and well-nourished.  HENT:  Head: Normocephalic and atraumatic.  Right Ear: External ear normal.  Left Ear: External ear normal.  Nose: Nose normal.  Mouth/Throat: Oropharynx is clear and moist.  Eyes: Conjunctivae and EOM are normal. Pupils are equal, round, and reactive to light.  Neck: Normal range of motion. Neck supple. No JVD present. No tracheal deviation present.  Cardiovascular: Normal rate, regular rhythm, normal heart sounds and intact distal pulses.   Pulmonary/Chest: Effort normal and breath sounds normal.  Abdominal: Soft. Bowel sounds are normal. There is no tenderness.  Musculoskeletal: Normal range of motion. Exhibits no edema.  Lymphadenopathy:  Has no cervical adenopathy.  Neurological: Pt is alert and oriented to person, place, and time. Pt has normal reflexes. No cranial nerve deficit.  Skin: Skin is warm and dry. No rash noted.  Psychiatric:  Has  normal mood and affect. Behavior is normal.     Assessment & Plan:

## 2012-03-11 ENCOUNTER — Ambulatory Visit (INDEPENDENT_AMBULATORY_CARE_PROVIDER_SITE_OTHER): Payer: Commercial Managed Care - PPO | Admitting: Cardiovascular Disease

## 2012-03-11 ENCOUNTER — Encounter: Payer: Self-pay | Admitting: Cardiovascular Disease

## 2012-03-11 VITALS — BP 111/77 | HR 73 | Ht 62.0 in | Wt 183.0 lb

## 2012-03-11 DIAGNOSIS — I359 Nonrheumatic aortic valve disorder, unspecified: Secondary | ICD-10-CM

## 2012-03-11 NOTE — Assessment & Plan Note (Addendum)
Exam remained stable. Will plan on repeat an echo at 12 month interval from her previous study. She is tolerating Coumadin without difficulty. She remains on aspirin 81 mg. Her sternal site appears stable and I don't think there is any serious problem going on. I reviewed her chest x-ray today. She will see Dr. Donata Clay as scheduled next week. I advised her to avoid any lifting over 20 pounds until she sees him in followup.

## 2012-03-11 NOTE — Progress Notes (Signed)
   HPI:  56 year old woman presenting for followup. She underwent mechanical aortic valve replacement in November 2012 for treatment of severe symptomatic bicuspid aortic stenosis.  She was treated with a 19 mm mechanical bileaflet valve. Her postoperative echo showed a mean gradient of 15 mm mercury.  The patient recently has developed pain near her sternotomy site. She had a cramp across her chest about a week ago but this was fairly transient. She has pain with certain movements and with use of her arms. She has no exertional chest pain or tightness. She denies orthopnea, PND, or dyspnea. She feels some popping and clicking around her sternum on occasion. She otherwise feels quite well.  Outpatient Encounter Prescriptions as of 03/11/2012  Medication Sig Dispense Refill  . acetaminophen (TYLENOL) 500 MG tablet Take 500 mg by mouth every 6 (six) hours as needed.        Marland Kitchen aspirin EC 81 MG tablet Take 1 tablet (81 mg total) by mouth daily.  150 tablet  2  . CRESTOR 20 MG tablet TAKE 1 TABLET BY MOUTH ONCE A DAY  90 tablet  3  . metFORMIN (GLUCOPHAGE) 500 MG tablet Take 1 tablet (500 mg total) by mouth daily with breakfast.  90 tablet  3  . metoprolol tartrate (LOPRESSOR) 25 MG tablet Take 0.5 tablets (12.5 mg total) by mouth 2 (two) times daily.  90 tablet  3  . warfarin (COUMADIN) 5 MG tablet TAKE ONE TABLET BY MOUTH EVERY DAY AS DIRECTED  30 tablet  3    Allergies  Allergen Reactions  . Atorvastatin     REACTION: myalgias Lipitor  . Codeine     REACTION: N/V  . Morphine And Related Nausea And Vomiting  . Niacin     REACTION: flushing  . Percocet (Oxycodone-Acetaminophen) Other (See Comments)    "blisters in mouth took in 1995"    Past Medical History  Diagnosis Date  . Hyperlipidemia   . Multinodular thyroid   . GERD (gastroesophageal reflux disease)   . Hx of hysterectomy   . Palpitation   . Aortic stenosis   . Heart murmur     aortic stenosis - dr. Excell Seltzer is her cardiologist    . Angina     chest tightness and pressure due to aortic stenosis  . Shortness of breath     associated with as  . Diabetes mellitus     newly diagnosed in july, 2012  . Headache     migraines when younger  . PONV (postoperative nausea and vomiting)     ROS: Negative except as per HPI  BP 111/77  Pulse 73  Ht 5\' 2"  (1.575 m)  Wt 183 lb (83.008 kg)  BMI 33.47 kg/m2  PHYSICAL EXAM: Pt is alert and oriented, NAD HEENT: normal Neck: JVP - normal, carotids 2+= without bruits Lungs: CTA bilaterally CV: RRR with grade 2/6 early systolic ejection murmur and normal mechanical aortic closure sound Chest: There is mild tenderness to palpation. The sternotomy scar is well healed. I do not feel any chest deformities or instability of the sternum  Abd: soft, NT, Positive BS, no hepatomegaly Ext: no C/C/E, distal pulses intact and equal Skin: warm/dry no rash  EKG:  Sinus rhythm with occasional PVC, heart rate 69 beats per minute, possible inferior infarct age undetermined.  ASSESSMENT AND PLAN:

## 2012-03-11 NOTE — Patient Instructions (Addendum)
Your physician wants you to follow-up in: 6 MONTHS. You will receive a reminder letter in the mail two months in advance. If you don't receive a letter, please call our office to schedule the follow-up appointment.  Your physician has requested that you have an echocardiogram in 6 MONTHS. Echocardiography is a painless test that uses sound waves to create images of your heart. It provides your doctor with information about the size and shape of your heart and how well your heart's chambers and valves are working. This procedure takes approximately one hour. There are no restrictions for this procedure.  Your physician recommends that you continue on your current medications as directed. Please refer to the Current Medication list given to you today.  

## 2012-03-17 ENCOUNTER — Other Ambulatory Visit: Payer: Self-pay | Admitting: Obstetrics and Gynecology

## 2012-03-17 ENCOUNTER — Other Ambulatory Visit: Payer: Self-pay | Admitting: Internal Medicine

## 2012-03-17 DIAGNOSIS — Z1231 Encounter for screening mammogram for malignant neoplasm of breast: Secondary | ICD-10-CM

## 2012-03-18 ENCOUNTER — Ambulatory Visit (INDEPENDENT_AMBULATORY_CARE_PROVIDER_SITE_OTHER): Payer: Commercial Managed Care - PPO | Admitting: Cardiothoracic Surgery

## 2012-03-18 ENCOUNTER — Encounter: Payer: Self-pay | Admitting: Cardiothoracic Surgery

## 2012-03-18 VITALS — BP 112/76 | HR 69 | Resp 18 | Ht 62.0 in | Wt 175.0 lb

## 2012-03-18 DIAGNOSIS — R0789 Other chest pain: Secondary | ICD-10-CM

## 2012-03-18 DIAGNOSIS — R079 Chest pain, unspecified: Secondary | ICD-10-CM

## 2012-03-18 DIAGNOSIS — Z952 Presence of prosthetic heart valve: Secondary | ICD-10-CM

## 2012-03-18 DIAGNOSIS — Z954 Presence of other heart-valve replacement: Secondary | ICD-10-CM

## 2012-03-18 NOTE — Progress Notes (Signed)
PCP is Oliver Barre, MD Referring Provider is Corwin Levins, MD  Chief Complaint  Patient presents with  . Chest Pain    S/P AVR on 06/25/11, c/o sternal pain at incision site, worse with movement x 6 weeks    HPI: 56 year old status post aortic arch replacement with mechanical valvular this year. She resume working lifting some heavy objects and has developed diffuse sternal chest wall pain. There is no evidence of infection. She states she feels some popping sensation. She presents for evaluation of the wound and old surgical incision. She denies problems with the valve prosthesis or difficult Coumadin. No symptoms of CHF.   Past Medical History  Diagnosis Date  . Hyperlipidemia   . Multinodular thyroid   . GERD (gastroesophageal reflux disease)   . Hx of hysterectomy   . Palpitation   . Aortic stenosis   . Heart murmur     aortic stenosis - dr. Excell Seltzer is her cardiologist  . Angina     chest tightness and pressure due to aortic stenosis  . Shortness of breath     associated with as  . Diabetes mellitus     newly diagnosed in july, 2012  . Headache     migraines when younger  . PONV (postoperative nausea and vomiting)     Past Surgical History  Procedure Date  . Cesarean section   . Bunionectomy   . Partial hysterectomy   . Abdominal hysterectomy   . Aortic valve replacement 06/25/2011    Procedure: AORTIC VALVE REPLACEMENT (AVR);  Surgeon: Kathlee Nations Suann Larry, MD;  Location: [facility] OR;  Service: Open Heart Surgery;  Laterality: N/A;  . Cardiac valve replacement     Aortic Valve   06/25/2011    Family History  Problem Relation Age of Onset  . Hypertension Other   . Coronary artery disease Father   . COPD Father   . Diabetes Mother   . Cancer Mother 52    bile duct cancer    Social History History  Substance Use Topics  . Smoking status: Former Smoker -- 1.0 packs/day for 15 years    Types: Cigarettes    Quit date: 08/12/1994  . Smokeless tobacco: Never Used  .  Alcohol Use: No    Current Outpatient Prescriptions  Medication Sig Dispense Refill  . acetaminophen (TYLENOL) 500 MG tablet Take 500 mg by mouth every 6 (six) hours as needed.        Marland Kitchen aspirin EC 81 MG tablet Take 1 tablet (81 mg total) by mouth daily.  150 tablet  2  . CRESTOR 20 MG tablet TAKE 1 TABLET BY MOUTH ONCE A DAY  90 tablet  3  . metFORMIN (GLUCOPHAGE) 500 MG tablet TAKE ONE TABLET BY MOUTH ONE TIME DAILY WITH BREAKFAST  90 tablet  3  . metoprolol tartrate (LOPRESSOR) 25 MG tablet Take 0.5 tablets (12.5 mg total) by mouth 2 (two) times daily.  90 tablet  3  . warfarin (COUMADIN) 5 MG tablet TAKE ONE TABLET BY MOUTH EVERY DAY AS DIRECTED  30 tablet  3  . DISCONTD: metoprolol succinate (TOPROL-XL) 25 MG 24 hr tablet Take 1 tablet (25 mg total) by mouth at bedtime.  30 tablet  11    Allergies  Allergen Reactions  . Atorvastatin     REACTION: myalgias Lipitor  . Codeine     REACTION: N/V  . Morphine And Related Nausea And Vomiting  . Niacin     REACTION: flushing  .  Percocet (Oxycodone-Acetaminophen) Other (See Comments)    "blisters in mouth took in 1995"    Review of Systems no no fever or weight loss no ankle edema no redness over the incision  BP 112/76  Pulse 69  Resp 18  Ht 5\' 2"  (1.575 m)  Wt 175 lb (79.379 kg)  BMI 32.01 kg/m2  SpO2 97% Physical Exam Alert and comfortable Lungs clear Valve with soft flow murmur sharp closure sound no murmur of AI Extremities warm well perfused Sternal incision completely healed no evidence of instability at all no evidence of cellulitis some tenderness to the touch and pressure on the left anterior chest wall Diagnostic Tests: Chest x-ray from late July reviewed showing sternal wires intact  Impression: Ches this soreness to allow chance for muscle inflammation to heal.  Plan: trial oral Neurontin   chest CT scan then returned office

## 2012-03-23 ENCOUNTER — Other Ambulatory Visit: Payer: Self-pay | Admitting: Cardiothoracic Surgery

## 2012-03-23 ENCOUNTER — Ambulatory Visit (INDEPENDENT_AMBULATORY_CARE_PROVIDER_SITE_OTHER): Payer: Commercial Managed Care - PPO | Admitting: *Deleted

## 2012-03-23 DIAGNOSIS — I359 Nonrheumatic aortic valve disorder, unspecified: Secondary | ICD-10-CM

## 2012-03-23 DIAGNOSIS — Z954 Presence of other heart-valve replacement: Secondary | ICD-10-CM

## 2012-03-23 DIAGNOSIS — R079 Chest pain, unspecified: Secondary | ICD-10-CM

## 2012-03-23 DIAGNOSIS — Z952 Presence of prosthetic heart valve: Secondary | ICD-10-CM

## 2012-03-23 DIAGNOSIS — Z7901 Long term (current) use of anticoagulants: Secondary | ICD-10-CM

## 2012-03-23 LAB — POCT INR: INR: 2.1

## 2012-03-24 ENCOUNTER — Ambulatory Visit: Payer: Commercial Managed Care - PPO | Admitting: Cardiovascular Disease

## 2012-03-25 ENCOUNTER — Other Ambulatory Visit: Payer: Self-pay | Admitting: Cardiothoracic Surgery

## 2012-04-08 ENCOUNTER — Ambulatory Visit
Admission: RE | Admit: 2012-04-08 | Discharge: 2012-04-08 | Disposition: A | Discharge status: Home / Self Care | Payer: Commercial Managed Care - PPO | Source: Ambulatory Visit | Attending: Obstetrics and Gynecology | Admitting: Obstetrics and Gynecology

## 2012-04-08 DIAGNOSIS — Z1231 Encounter for screening mammogram for malignant neoplasm of breast: Secondary | ICD-10-CM

## 2012-04-15 ENCOUNTER — Encounter (INDEPENDENT_AMBULATORY_CARE_PROVIDER_SITE_OTHER): Payer: Commercial Managed Care - PPO | Admitting: Cardiothoracic Surgery

## 2012-04-15 ENCOUNTER — Ambulatory Visit
Admission: RE | Admit: 2012-04-15 | Discharge: 2012-04-15 | Disposition: A | Discharge status: Home / Self Care | Payer: Commercial Managed Care - PPO | Source: Ambulatory Visit | Attending: Cardiothoracic Surgery | Admitting: Cardiothoracic Surgery

## 2012-04-15 ENCOUNTER — Encounter: Payer: Self-pay | Admitting: Cardiothoracic Surgery

## 2012-04-15 DIAGNOSIS — R079 Chest pain, unspecified: Secondary | ICD-10-CM

## 2012-04-15 MED ORDER — IOHEXOL 300 MG/ML  SOLN
75.0000 mL | Freq: Once | INTRAMUSCULAR | Status: AC | PRN
  Administered 2012-04-15: 75 mL via INTRAVENOUS

## 2012-04-17 ENCOUNTER — Ambulatory Visit (INDEPENDENT_AMBULATORY_CARE_PROVIDER_SITE_OTHER): Payer: Commercial Managed Care - PPO | Admitting: Cardiothoracic Surgery

## 2012-04-17 ENCOUNTER — Encounter: Payer: Self-pay | Admitting: Cardiothoracic Surgery

## 2012-04-17 ENCOUNTER — Encounter: Payer: Commercial Managed Care - PPO | Admitting: Cardiothoracic Surgery

## 2012-04-17 VITALS — BP 113/74 | HR 62 | Resp 20 | Ht 62.0 in | Wt 175.0 lb

## 2012-04-17 DIAGNOSIS — Z954 Presence of other heart-valve replacement: Secondary | ICD-10-CM

## 2012-04-17 DIAGNOSIS — R0789 Other chest pain: Secondary | ICD-10-CM

## 2012-04-17 DIAGNOSIS — Z952 Presence of prosthetic heart valve: Secondary | ICD-10-CM

## 2012-04-17 DIAGNOSIS — R071 Chest pain on breathing: Secondary | ICD-10-CM

## 2012-04-17 NOTE — Progress Notes (Signed)
PCP is Oliver Barre, MD Referring Provider is Tonny Bollman, MD                     [address] [place].[address]            [name] [id number]          [phone]     Chief Complaint  Patient presents with  . Routine Post Op    3 week f/u with Chest Ct, new medicaion eval. S/P AVR on 06/25/11     HPI: The patient returns for followup of her sternal chest wall pain after aVR 9-10 months ago. She is back to work. When she lifts or bends over to pick something up she is pain across her chest. It is not severe. She is concerned.  A CT scan of the chest was performed which showed the sternum to be completely healed with normal union. No evidence of nonhealing or infection. This was communicated to the patient and she was reassured. She'll continue to work but try to avoid lifting more than 20 pounds or lifting objects at cause the pain. I reassured her that lifting would not structurally damage for chest incision but that she has some sensitive scar which should continue to gradually improve     Past Medical History  Diagnosis Date  . Hyperlipidemia   . Multinodular thyroid   . GERD (gastroesophageal reflux disease)   . Hx of hysterectomy   . Palpitation   . Aortic stenosis   . Heart murmur     aortic stenosis - dr. Excell Seltzer is her cardiologist  . Angina     chest tightness and pressure due to aortic stenosis  . Shortness of breath     associated with as  . Diabetes mellitus     newly diagnosed in july, 2012  . Headache     migraines when younger  . PONV (postoperative nausea and vomiting)     Past Surgical History  Procedure Date  . Cesarean section   . Bunionectomy   . Partial hysterectomy   . Abdominal hysterectomy   . Aortic valve replacement 06/25/2011    Procedure: AORTIC VALVE REPLACEMENT (AVR);  Surgeon: Kathlee Nations Suann Larry, MD;  Location: [facility] OR;  Service: Open Heart Surgery;  Laterality: N/A;  . Cardiac valve replacement     Aortic Valve   06/25/2011     Family History  Problem Relation Age of Onset  . Hypertension Other   . Coronary artery disease Father   . COPD Father   . Diabetes Mother   . Cancer Mother 48    bile duct cancer    Social History History  Substance Use Topics  . Smoking status: Former Smoker -- 1.0 packs/day for 15 years    Types: Cigarettes    Quit date: 08/12/1994  . Smokeless tobacco: Never Used  . Alcohol Use: No    Current Outpatient Prescriptions  Medication Sig Dispense Refill  . acetaminophen (TYLENOL) 500 MG tablet Take 500 mg by mouth every 6 (six) hours as needed.        . CRESTOR 20 MG tablet TAKE 1 TABLET BY MOUTH ONCE A DAY  90 tablet  3  . metFORMIN (GLUCOPHAGE) 500 MG tablet TAKE ONE TABLET BY MOUTH ONE TIME DAILY WITH BREAKFAST  90 tablet  3  . metoprolol tartrate (LOPRESSOR) 25 MG tablet Take 0.5 tablets (12.5 mg total) by mouth 2 (two) times daily.  90 tablet  3  .  warfarin (COUMADIN) 5 MG tablet TAKE ONE TABLET BY MOUTH EVERY DAY AS DIRECTED  30 tablet  3  . DISCONTD: metoprolol succinate (TOPROL-XL) 25 MG 24 hr tablet Take 1 tablet (25 mg total) by mouth at bedtime.  30 tablet  11    Allergies  Allergen Reactions  . Atorvastatin     REACTION: myalgias Lipitor  . Codeine     REACTION: N/V  . Morphine And Related Nausea And Vomiting  . Niacin     REACTION: flushing  . Percocet (Oxycodone-Acetaminophen) Other (See Comments)    "blisters in mouth took in 1995"    Review of Systems no fever no weight loss.  BP 113/74  Pulse 62  Resp 20  Ht 5\' 2"  (1.575 m)  Wt 175 lb (79.379 kg)  BMI 32.01 kg/m2  SpO2 97% Physical Exam General alert and comfortable Cardiac rhythm regular Slight flow murmur through the aVR Chest wall stable no erythema no swelling no click  Diagnostic Tests: CT scan of the chest shows normal union of the sternum. No question of nonhealing or fibrous malunion. Note made by radiologist of [place] areas in the liver. She states she hadC T. scan several  years ago with a followup MRI showed she had some liver hemangiomas which is probably the explanation of the comments made by the radiologist on the CT scan of the chest.  Impression: Normal healing of sternotomy after aVR  Plan: Return as needed

## 2012-04-21 ENCOUNTER — Ambulatory Visit (INDEPENDENT_AMBULATORY_CARE_PROVIDER_SITE_OTHER): Payer: Commercial Managed Care - PPO

## 2012-04-21 DIAGNOSIS — Z952 Presence of prosthetic heart valve: Secondary | ICD-10-CM

## 2012-04-21 DIAGNOSIS — Z954 Presence of other heart-valve replacement: Secondary | ICD-10-CM

## 2012-04-21 DIAGNOSIS — I359 Nonrheumatic aortic valve disorder, unspecified: Secondary | ICD-10-CM

## 2012-04-21 DIAGNOSIS — Z7901 Long term (current) use of anticoagulants: Secondary | ICD-10-CM

## 2012-05-11 ENCOUNTER — Encounter: Payer: Self-pay | Admitting: Internal Medicine

## 2012-05-11 ENCOUNTER — Ambulatory Visit (INDEPENDENT_AMBULATORY_CARE_PROVIDER_SITE_OTHER): Payer: Commercial Managed Care - PPO | Admitting: Internal Medicine

## 2012-05-11 VITALS — BP 118/88 | HR 65 | Temp 97.6°F | Ht 62.0 in | Wt 186.2 lb

## 2012-05-11 DIAGNOSIS — IMO0001 Reserved for inherently not codable concepts without codable children: Secondary | ICD-10-CM

## 2012-05-11 DIAGNOSIS — F411 Generalized anxiety disorder: Secondary | ICD-10-CM

## 2012-05-11 DIAGNOSIS — M549 Dorsalgia, unspecified: Secondary | ICD-10-CM | POA: Insufficient documentation

## 2012-05-11 MED ORDER — HYDROCODONE-ACETAMINOPHEN 5-325 MG PO TABS: 1.0000 | ORAL_TABLET | Freq: Four times a day (QID) | ORAL | Status: DC | PRN

## 2012-05-11 MED ORDER — CYCLOBENZAPRINE HCL 5 MG PO TABS: 5.0000 mg | ORAL_TABLET | Freq: Three times a day (TID) | ORAL | Status: DC | PRN

## 2012-05-11 NOTE — Assessment & Plan Note (Signed)
C/w acute MSK spasm/muscular pain most likely as etiology, for vicodin/flexeril prn, to avoid further heavy lifting,  to f/u any worsening symptoms or concerns such as worsening neuro change

## 2012-05-11 NOTE — Assessment & Plan Note (Signed)
stable overall by hx and exam, most recent data reviewed with pt, and pt to continue medical treatment as before Lab Results  Component Value Date   WBC 8.9 03/04/2012   HGB 13.9 03/04/2012   HCT 41.6 03/04/2012   PLT 226.0 03/04/2012   GLUCOSE 117* 03/04/2012   CHOL 209* 03/04/2012   TRIG 290.0* 03/04/2012   HDL 58.20 03/04/2012   LDLDIRECT 118.9 03/04/2012   LDLCALC 91 03/18/2011   ALT 17 03/04/2012   AST 25 03/04/2012   NA 139 03/04/2012   K 4.8 03/04/2012   CL 101 03/04/2012   CREATININE 0.8 03/04/2012   BUN 14 03/04/2012   CO2 27 03/04/2012   TSH 2.00 03/04/2012   INR 2.1 04/21/2012   HGBA1C 7.3* 03/04/2012   MICROALBUR 0.3 03/04/2012

## 2012-05-11 NOTE — Patient Instructions (Addendum)
Take all new medications as prescribed Continue all other medications as before  

## 2012-05-11 NOTE — Progress Notes (Signed)
Subjective:    Patient ID: [name], female    DOB: 1956-04-08, 56 y.o.   MRN: [id number]  HPI  Here with c/o onset LBP for 2-3 days, after an unusual for her episode of using the heavy vacuum cleaner to the house and vacuuming the drapes;  Overall moderate to severe, constant, worse to stand and take first few step, some better to lying down still;  Some radiation to the right buttock and post right leg to mid post thigh, No bowel or bladder change, fever, wt loss,  worsening other LE pain/numbness/weakness, gait change or falls.  Drove herself here, though she had to lift the right leg to get in the car.  Pt denies chest pain, increased sob or doe, wheezing, orthopnea, PND, increased LE swelling, palpitations, dizziness or syncope.   Pt denies polydipsia, polyuria.   Pt denies fever, wt loss, night sweats, loss of appetite, or other constitutional symptoms. Denies worsening depressive symptoms, suicidal ideation, or panic Past Medical History  Diagnosis Date  . Hyperlipidemia   . Multinodular thyroid   . GERD (gastroesophageal reflux disease)   . Hx of hysterectomy   . Palpitation   . Aortic stenosis   . Heart murmur     aortic stenosis - dr. Excell Seltzer is her cardiologist  . Angina     chest tightness and pressure due to aortic stenosis  . Shortness of breath     associated with as  . Diabetes mellitus     newly diagnosed in july, 2012  . Headache     migraines when younger  . PONV (postoperative nausea and vomiting)    Past Surgical History  Procedure Date  . Cesarean section   . Bunionectomy   . Partial hysterectomy   . Abdominal hysterectomy   . Aortic valve replacement 06/25/2011    Procedure: AORTIC VALVE REPLACEMENT (AVR);  Surgeon: Kathlee Nations Suann Larry, MD;  Location: [facility] OR;  Service: Open Heart Surgery;  Laterality: N/A;  . Cardiac valve replacement     Aortic Valve   06/25/2011    reports that she quit smoking about 17 years ago. Her smoking use included Cigarettes.  She has a 15 pack-year smoking history. She has never used smokeless tobacco. She reports that she does not drink alcohol or use illicit drugs. family history includes COPD in her father; Cancer (age of onset:72) in her mother; Coronary artery disease in her father; Diabetes in her mother; and Hypertension in her other. Allergies  Allergen Reactions  . Atorvastatin     REACTION: myalgias Lipitor  . Codeine     REACTION: N/V  . Morphine And Related Nausea And Vomiting  . Niacin     REACTION: flushing  . Percocet (Oxycodone-Acetaminophen) Other (See Comments)    "blisters in mouth took in 1995"   Current Outpatient Prescriptions on File Prior to Visit  Medication Sig Dispense Refill  . acetaminophen (TYLENOL) 500 MG tablet Take 500 mg by mouth every 6 (six) hours as needed.        . CRESTOR 20 MG tablet TAKE 1 TABLET BY MOUTH ONCE A DAY  90 tablet  3  . metFORMIN (GLUCOPHAGE) 500 MG tablet TAKE ONE TABLET BY MOUTH ONE TIME DAILY WITH BREAKFAST  90 tablet  3  . metoprolol tartrate (LOPRESSOR) 25 MG tablet Take 0.5 tablets (12.5 mg total) by mouth 2 (two) times daily.  90 tablet  3  . warfarin (COUMADIN) 5 MG tablet TAKE ONE TABLET BY MOUTH EVERY  DAY AS DIRECTED  30 tablet  3  . DISCONTD: metoprolol succinate (TOPROL-XL) 25 MG 24 hr tablet Take 1 tablet (25 mg total) by mouth at bedtime.  30 tablet  11   Review of Systems  Constitutional: Negative for diaphoresis and unexpected weight change.  HENT: Negative for tinnitus.   Eyes: Negative for photophobia and visual disturbance.  Respiratory: Negative for choking and stridor.   Gastrointestinal: Negative for vomiting and blood in stool.  Genitourinary: Negative for hematuria and decreased urine volume.  Musculoskeletal: Negative for gait problem. except stiffness and pain as above Skin: Negative for color change and wound.  Neurological: Negative for tremors and numbness.  Psychiatric/Behavioral: Negative for decreased concentration. The  patient is not hyperactive.       Objective:   Physical Exam BP 118/88  Pulse 65  Temp 97.6 F (36.4 C) (Oral)  Ht 5\' 2"  (1.575 m)  Wt 186 lb 4 oz (84.482 kg)  BMI 34.07 kg/m2  SpO2 97% Physical Exam  VS noted Constitutional: Pt appears well-developed and well-nourished.  HENT: Head: Normocephalic.  Right Ear: External ear normal.  Left Ear: External ear normal.  Eyes: Conjunctivae and EOM are normal. Pupils are equal, round, and reactive to light.  Neck: Normal range of motion. Neck supple.  Cardiovascular: Normal rate and regular rhythm.   Pulmonary/Chest: Effort normal and breath sounds normal.  Abd:  Soft, NT, non-distended, + BS Neurological: Pt is alert. Not confused , spine nontender, motor/sens/dtr intact though seems to hold back on the RLE strength testing due to LBP Has marked right > left flank muscular spasm tender noted, reproduces pain Skin: Skin is warm. No erythema. No rash, no bruising or overt bleeding Psychiatric: Pt behavior is normal. Thought content normal. minor nervous, not depressed affet    Assessment & Plan:

## 2012-05-11 NOTE — Assessment & Plan Note (Signed)
stable overall by hx and exam, most recent data reviewed with pt, and pt to continue medical treatment as before Lab Results  Component Value Date   HGBA1C 7.3* 03/04/2012

## 2012-05-19 ENCOUNTER — Ambulatory Visit (INDEPENDENT_AMBULATORY_CARE_PROVIDER_SITE_OTHER): Payer: Commercial Managed Care - PPO

## 2012-05-19 DIAGNOSIS — Z7901 Long term (current) use of anticoagulants: Secondary | ICD-10-CM

## 2012-05-19 DIAGNOSIS — Z954 Presence of other heart-valve replacement: Secondary | ICD-10-CM

## 2012-05-19 DIAGNOSIS — Z952 Presence of prosthetic heart valve: Secondary | ICD-10-CM

## 2012-05-19 DIAGNOSIS — I359 Nonrheumatic aortic valve disorder, unspecified: Secondary | ICD-10-CM

## 2012-06-09 ENCOUNTER — Other Ambulatory Visit: Payer: Self-pay | Admitting: Cardiovascular Disease

## 2012-06-17 ENCOUNTER — Ambulatory Visit (INDEPENDENT_AMBULATORY_CARE_PROVIDER_SITE_OTHER): Payer: Commercial Managed Care - PPO | Admitting: *Deleted

## 2012-06-17 DIAGNOSIS — I359 Nonrheumatic aortic valve disorder, unspecified: Secondary | ICD-10-CM

## 2012-06-17 DIAGNOSIS — Z952 Presence of prosthetic heart valve: Secondary | ICD-10-CM

## 2012-06-17 DIAGNOSIS — Z954 Presence of other heart-valve replacement: Secondary | ICD-10-CM

## 2012-06-17 DIAGNOSIS — Z7901 Long term (current) use of anticoagulants: Secondary | ICD-10-CM

## 2012-06-17 LAB — POCT INR: INR: 2.6

## 2012-07-16 ENCOUNTER — Ambulatory Visit (INDEPENDENT_AMBULATORY_CARE_PROVIDER_SITE_OTHER): Payer: Commercial Managed Care - PPO

## 2012-07-16 DIAGNOSIS — I359 Nonrheumatic aortic valve disorder, unspecified: Secondary | ICD-10-CM

## 2012-07-16 DIAGNOSIS — Z7901 Long term (current) use of anticoagulants: Secondary | ICD-10-CM

## 2012-07-16 DIAGNOSIS — Z952 Presence of prosthetic heart valve: Secondary | ICD-10-CM

## 2012-07-16 DIAGNOSIS — Z954 Presence of other heart-valve replacement: Secondary | ICD-10-CM

## 2012-07-16 LAB — POCT INR: INR: 2.2

## 2012-08-10 ENCOUNTER — Ambulatory Visit (INDEPENDENT_AMBULATORY_CARE_PROVIDER_SITE_OTHER): Payer: Commercial Managed Care - PPO | Admitting: *Deleted

## 2012-08-10 DIAGNOSIS — Z954 Presence of other heart-valve replacement: Secondary | ICD-10-CM

## 2012-08-10 DIAGNOSIS — Z952 Presence of prosthetic heart valve: Secondary | ICD-10-CM

## 2012-08-10 DIAGNOSIS — I359 Nonrheumatic aortic valve disorder, unspecified: Secondary | ICD-10-CM

## 2012-08-10 DIAGNOSIS — Z7901 Long term (current) use of anticoagulants: Secondary | ICD-10-CM

## 2012-08-10 LAB — POCT INR: INR: 1.9

## 2012-08-14 ENCOUNTER — Other Ambulatory Visit: Payer: Self-pay | Admitting: Cardiovascular Disease

## 2012-08-30 ENCOUNTER — Telehealth: Payer: Self-pay | Admitting: Physician Assistant

## 2012-08-30 NOTE — Telephone Encounter (Signed)
Pt s/p AoVR November 2012. She is on coumadin because it is a mechanical valve. She had a mechanical fall today, landing on her lower ribs and abdomen. She also had an abrasion on her elbow, but was able to stop the bleeding. She was concerned about potential bleeding from the fall.  Advised her to watch for bruising and external bleeding as well and pain/swelling that might indicate internal bleeding. Call for any questions/concerns and keep Coum clinic appt in am as scheduled.

## 2012-08-31 ENCOUNTER — Ambulatory Visit (INDEPENDENT_AMBULATORY_CARE_PROVIDER_SITE_OTHER): Payer: Commercial Managed Care - PPO | Admitting: *Deleted

## 2012-08-31 DIAGNOSIS — Z952 Presence of prosthetic heart valve: Secondary | ICD-10-CM

## 2012-08-31 DIAGNOSIS — Z7901 Long term (current) use of anticoagulants: Secondary | ICD-10-CM

## 2012-08-31 DIAGNOSIS — Z954 Presence of other heart-valve replacement: Secondary | ICD-10-CM

## 2012-08-31 DIAGNOSIS — I359 Nonrheumatic aortic valve disorder, unspecified: Secondary | ICD-10-CM

## 2012-08-31 LAB — POCT INR: INR: 1.9

## 2012-09-02 ENCOUNTER — Other Ambulatory Visit (INDEPENDENT_AMBULATORY_CARE_PROVIDER_SITE_OTHER): Payer: Commercial Managed Care - PPO

## 2012-09-02 DIAGNOSIS — IMO0001 Reserved for inherently not codable concepts without codable children: Secondary | ICD-10-CM

## 2012-09-02 LAB — BASIC METABOLIC PANEL
CO2: 27 mEq/L (ref 19–32)
Calcium: 9 mg/dL (ref 8.4–10.5)
Chloride: 105 mEq/L (ref 96–112)
Glucose, Bld: 137 mg/dL — ABNORMAL HIGH (ref 70–99)
Sodium: 140 mEq/L (ref 135–145)

## 2012-09-02 LAB — LIPID PANEL
Total CHOL/HDL Ratio: 4
Triglycerides: 253 mg/dL — ABNORMAL HIGH (ref 0.0–149.0)

## 2012-09-09 ENCOUNTER — Encounter: Payer: Self-pay | Admitting: Internal Medicine

## 2012-09-09 ENCOUNTER — Ambulatory Visit (INDEPENDENT_AMBULATORY_CARE_PROVIDER_SITE_OTHER): Payer: Commercial Managed Care - PPO | Admitting: Internal Medicine

## 2012-09-09 ENCOUNTER — Other Ambulatory Visit (INDEPENDENT_AMBULATORY_CARE_PROVIDER_SITE_OTHER): Payer: Commercial Managed Care - PPO

## 2012-09-09 VITALS — BP 120/70 | HR 74 | Temp 97.7°F | Ht 62.0 in | Wt 190.0 lb

## 2012-09-09 DIAGNOSIS — R5383 Other fatigue: Secondary | ICD-10-CM

## 2012-09-09 DIAGNOSIS — E785 Hyperlipidemia, unspecified: Secondary | ICD-10-CM

## 2012-09-09 DIAGNOSIS — Z0000 Encounter for general adult medical examination without abnormal findings: Secondary | ICD-10-CM

## 2012-09-09 DIAGNOSIS — R5381 Other malaise: Secondary | ICD-10-CM

## 2012-09-09 DIAGNOSIS — IMO0001 Reserved for inherently not codable concepts without codable children: Secondary | ICD-10-CM

## 2012-09-09 MED ORDER — METFORMIN HCL 500 MG PO TABS: 500.0000 mg | ORAL_TABLET | Freq: Two times a day (BID) | ORAL | Status: DC

## 2012-09-09 NOTE — Assessment & Plan Note (Signed)
stable overall by history and exam, recent data reviewed with pt, and pt to continue medical treatment as before,  to f/u any worsening symptoms or concerns Lab Results  Component Value Date   LDLCALC 91 03/18/2011

## 2012-09-09 NOTE — Patient Instructions (Addendum)
Please take all new medication as prescribed - the increased metformin Please continue all other medications as before Please have the pharmacy call with any other refills you may need. Please continue your efforts at being more active, low cholesterol diet, and weight control. Please keep your appointments with your specialists as you have planned  - coumadin clinic and Dr Excell Seltzer Please call if you wish to change your coumadin clinic to the [facility]) Please go to the LAB in the Basement (turn left off the elevator) for the tests to be done today You will be contacted by phone if any changes need to be made immediately.  Otherwise, you will receive a letter about your results with an explanation, but please check with MyChart first. Thank you for enrolling in MyChart. Please follow the instructions below to securely access your online medical record. MyChart allows you to send messages to your doctor, view your test results, renew your prescriptions, schedule appointments, and more. To Log into My Chart online, please go by [employer] or [employer] to [email or web address], or download the MyChart App from the [employer] of [employer] .  Your Username is: [email or web address] (pass [id number]) Please send a practice Message on Mychart later today. Please return in 6 months, or sooner if needed, with Lab testing done 3-5 days before

## 2012-09-09 NOTE — Progress Notes (Signed)
Subjective:    Patient ID: [name], female    DOB: April 15, 1956, 57 y.o.   MRN: [id number]  HPI  Here to f/u; overall doing ok,  Pt denies chest pain, increased sob or doe, wheezing, orthopnea, PND, increased LE swelling, palpitations, dizziness or syncope.  Pt denies polydipsia, polyuria, or low sugar symptoms such as weakness or confusion improved with po intake.  Pt denies new neurological symptoms such as new headache, or facial or extremity weakness or numbness.   Pt states overall good compliance with meds, has been trying to follow lower cholesterol, diabetic diet, with wt overall stable,  Tries to walk daily, hard to lose wt, asks for the "full range" of thyroid testing.  Does c/o mild ongoing fatigue and occasional arthralgias, but denies signficant daytime hypersomnolence.  No overt bleeding or bruising, for f/u INR tomorrow, and card eval. Past Medical History  Diagnosis Date  . Hyperlipidemia   . Multinodular thyroid   . GERD (gastroesophageal reflux disease)   . Hx of hysterectomy   . Palpitation   . Aortic stenosis   . Heart murmur     aortic stenosis - dr. Excell Seltzer is her cardiologist  . Angina     chest tightness and pressure due to aortic stenosis  . Shortness of breath     associated with as  . Diabetes mellitus     newly diagnosed in july, 2012  . Headache     migraines when younger  . PONV (postoperative nausea and vomiting)    Past Surgical History  Procedure Date  . Cesarean section   . Bunionectomy   . Partial hysterectomy   . Abdominal hysterectomy   . Aortic valve replacement 06/25/2011    Procedure: AORTIC VALVE REPLACEMENT (AVR);  Surgeon: Kathlee Nations Suann Larry, MD;  Location: [facility] OR;  Service: Open Heart Surgery;  Laterality: N/A;  . Cardiac valve replacement     Aortic Valve   06/25/2011    reports that she quit smoking about 18 years ago. Her smoking use included Cigarettes. She has a 15 pack-year smoking history. She has never used smokeless tobacco.  She reports that she does not drink alcohol or use illicit drugs. family history includes COPD in her father; Cancer (age of onset:72) in her mother; Coronary artery disease in her father; Diabetes in her mother; and Hypertension in her other. Allergies  Allergen Reactions  . Atorvastatin     REACTION: myalgias Lipitor  . Codeine     REACTION: N/V  . Morphine And Related Nausea And Vomiting  . Niacin     REACTION: flushing  . Percocet (Oxycodone-Acetaminophen) Other (See Comments)    "blisters in mouth took in 1995"   Current Outpatient Prescriptions on File Prior to Visit  Medication Sig Dispense Refill  . aspirin 81 MG tablet Take 81 mg by mouth daily.      . CRESTOR 20 MG tablet TAKE 1 TABLET BY MOUTH ONCE A DAY  90 tablet  3  . metoprolol tartrate (LOPRESSOR) 25 MG tablet Take 0.5 tablets (12.5 mg total) by mouth 2 (two) times daily.  90 tablet  3  . warfarin (COUMADIN) 5 MG tablet TAKE ONE TABLET BY MOUTH EVERY DAY AS DIRECTED  30 tablet  2  . [DISCONTINUED] metoprolol succinate (TOPROL-XL) 25 MG 24 hr tablet Take 1 tablet (25 mg total) by mouth at bedtime.  30 tablet  11   Review of Systems  Constitutional: Negative for unexpected weight change, or unusual diaphoresis  HENT: Negative for tinnitus.   Eyes: Negative for photophobia and visual disturbance.  Respiratory: Negative for choking and stridor.   Gastrointestinal: Negative for vomiting and blood in stool.  Genitourinary: Negative for hematuria and decreased urine volume.  Musculoskeletal: Negative for acute joint swelling Skin: Negative for color change and wound.  Neurological: Negative for tremors and numbness other than noted  Psychiatric/Behavioral: Negative for decreased concentration or  hyperactivity.       Objective:   Physical Exam BP 120/70  Pulse 74  Temp 97.7 F (36.5 C) (Oral)  Ht 5\' 2"  (1.575 m)  Wt 190 lb (86.183 kg)  BMI 34.75 kg/m2  SpO2 95% VS noted,  Constitutional: Pt appears  well-developed and well-nourished.  HENT: Head: NCAT.  Right Ear: External ear normal.  Left Ear: External ear normal.  Eyes: Conjunctivae and EOM are normal. Pupils are equal, round, and reactive to light.  Neck: Normal range of motion. Neck supple.  Cardiovascular: Normal rate and regular rhythm.   Pulmonary/Chest: Effort normal and breath sounds normal.  Neurological: Pt is alert. Not confused  Skin: Skin is warm. No erythema.  Psychiatric: Pt behavior is normal. Thought content normal.     Assessment & Plan:

## 2012-09-09 NOTE — Assessment & Plan Note (Signed)
Etiology unclear, Exam otherwise benign, to check labs as documented, follow with expectant management  

## 2012-09-09 NOTE — Assessment & Plan Note (Signed)
Mild uncontrolled, likely due to progression of dz, recent data reviewed with pt, and pt to increase the metformin to 500 bid,  to f/u any worsening symptoms or concerns Lab Results  Component Value Date   HGBA1C 7.4* 09/02/2012

## 2012-09-10 ENCOUNTER — Encounter: Payer: Self-pay | Admitting: Cardiovascular Disease

## 2012-09-10 ENCOUNTER — Ambulatory Visit (INDEPENDENT_AMBULATORY_CARE_PROVIDER_SITE_OTHER): Payer: Commercial Managed Care - PPO | Admitting: Cardiovascular Disease

## 2012-09-10 ENCOUNTER — Ambulatory Visit (INDEPENDENT_AMBULATORY_CARE_PROVIDER_SITE_OTHER): Payer: Commercial Managed Care - PPO

## 2012-09-10 ENCOUNTER — Other Ambulatory Visit: Payer: Self-pay | Admitting: Cardiovascular Disease

## 2012-09-10 ENCOUNTER — Ambulatory Visit (HOSPITAL_COMMUNITY): Payer: Commercial Managed Care - PPO | Attending: Internal Medicine | Admitting: Radiology

## 2012-09-10 VITALS — BP 128/78 | HR 71 | Ht 62.0 in | Wt 189.0 lb

## 2012-09-10 DIAGNOSIS — I359 Nonrheumatic aortic valve disorder, unspecified: Secondary | ICD-10-CM

## 2012-09-10 DIAGNOSIS — R5383 Other fatigue: Secondary | ICD-10-CM | POA: Insufficient documentation

## 2012-09-10 DIAGNOSIS — E785 Hyperlipidemia, unspecified: Secondary | ICD-10-CM | POA: Insufficient documentation

## 2012-09-10 DIAGNOSIS — Z954 Presence of other heart-valve replacement: Secondary | ICD-10-CM

## 2012-09-10 DIAGNOSIS — Z7901 Long term (current) use of anticoagulants: Secondary | ICD-10-CM

## 2012-09-10 DIAGNOSIS — E119 Type 2 diabetes mellitus without complications: Secondary | ICD-10-CM | POA: Insufficient documentation

## 2012-09-10 DIAGNOSIS — Z952 Presence of prosthetic heart valve: Secondary | ICD-10-CM

## 2012-09-10 DIAGNOSIS — R5381 Other malaise: Secondary | ICD-10-CM | POA: Insufficient documentation

## 2012-09-10 LAB — POCT INR: INR: 2.3

## 2012-09-10 LAB — TSH: TSH: 1.51 u[IU]/mL (ref 0.35–5.50)

## 2012-09-10 LAB — T3, FREE: T3, Free: 2.7 pg/mL (ref 2.3–4.2)

## 2012-09-10 NOTE — Patient Instructions (Addendum)
Your physician has requested that you have a TEE. During a TEE, sound waves are used to create images of your heart. It provides your doctor with information about the size and shape of your heart and how well your heart's chambers and valves are working. In this test, a transducer is attached to the end of a flexible tube that's guided down your throat and into your esophagus (the tube leading from you mouth to your stomach) to get a more detailed image of your heart. You are not awake for the procedure. Please see the instruction sheet given to you today. For further information please visit [email or web address]  Your physician recommends that you continue on your current medications as directed. Please refer to the Current Medication list given to you today.  Your physician recommends that you schedule a follow-up appointment in: 1 MONTH with Dr Excell Seltzer

## 2012-09-10 NOTE — Progress Notes (Signed)
Echocardiogram performed.  

## 2012-09-10 NOTE — Progress Notes (Signed)
 HPI:   57-year-old patient presenting for followup evaluation. The patient has a history of severe bicuspid aortic stenosis and she underwent mechanical aortic valve replacement in November 2012 with a 19 mm mechanical bileaflet valve. Her postoperative echo demonstrated a mean gradient of 15 mm mercury. She had postoperative sternal pain several months out from surgery and was evaluated with a chest CT which demonstrated normal union of the sternum. Recent labs showed a cholesterol of 192, triglycerides 253, HDL 51, and LDL 106. Hemoglobin A1c was 7.4. Thyroid studies were normal.  The patient has been working a lot. She's not been exercising regularly. Overall she feels well. She denies chest pain or pressure, lightheadedness, or syncope. Her sternal chest pain has resolved. She does have mild exertional dyspnea. She denies orthopnea, PND, or edema.  Outpatient Encounter Prescriptions as of 09/10/2012  Medication Sig Dispense Refill  . aspirin 81 MG tablet Take 81 mg by mouth daily.      . CRESTOR 20 MG tablet TAKE 1 TABLET BY MOUTH ONCE A DAY  90 tablet  3  . metFORMIN (GLUCOPHAGE) 500 MG tablet Take 1 tablet (500 mg total) by mouth 2 (two) times daily with a meal.  180 tablet  3  . metoprolol tartrate (LOPRESSOR) 25 MG tablet Take 0.5 tablets (12.5 mg total) by mouth 2 (two) times daily.  90 tablet  3  . warfarin (COUMADIN) 5 MG tablet TAKE ONE TABLET BY MOUTH EVERY DAY AS DIRECTED  30 tablet  2    Allergies  Allergen Reactions  . Atorvastatin     REACTION: myalgias Lipitor  . Codeine     REACTION: N/V  . Morphine And Related Nausea And Vomiting  . Niacin     REACTION: flushing  . Percocet (Oxycodone-Acetaminophen) Other (See Comments)    "blisters in mouth took in 1995"    Past Medical History  Diagnosis Date  . Hyperlipidemia   . Multinodular thyroid   . GERD (gastroesophageal reflux disease)   . Hx of hysterectomy   . Palpitation   . Aortic stenosis   . Heart murmur    aortic stenosis - dr. Mariame Rybolt is her cardiologist  . Angina     chest tightness and pressure due to aortic stenosis  . Shortness of breath     associated with as  . Diabetes mellitus     newly diagnosed in july, 2012  . Headache     migraines when younger  . PONV (postoperative nausea and vomiting)    ROS: Negative except as per HPI  BP 128/78  Pulse 71  Ht 5' 2" (1.575 m)  Wt 85.73 kg (189 lb)  BMI 34.57 kg/m2  SpO2 99%  PHYSICAL EXAM: Pt is alert and oriented, NAD HEENT: normal Neck: JVP - normal, carotids 2+= without bruits Lungs: CTA bilaterally CV: RRR with grade 2/6 systolic murmur at the right upper sternal border and crisp A2 closure sound Abd: soft, NT, Positive BS, no hepatomegaly Ext: no C/C/E, distal pulses intact and equal Skin: warm/dry no rash  EKG:  Normal sinus rhythm 71 beats per minute, single PVC noted, nonspecific T wave abnormality.  2-D echo 09/19/2011: Left ventricle: The cavity size was normal. Wall thickness was increased in a pattern of moderate LVH. Systolic function was normal. The estimated ejection fraction was in the range of 55% to 60%.  ------------------------------------------------------------ Aortic valve: Mechanical AVR not well visualized No regurgitaton. Post PVC gradients seem a little high Doppler: Mean gradient: 16mm Hg (S).   Peak gradient: 25mm Hg (S).  ------------------------------------------------------------ Aorta: The aorta was normal, not dilated, and non-diseased.  ------------------------------------------------------------ Mitral valve: Mildly thickened leaflets . Doppler: Peak gradient: 4mm Hg (D).  ------------------------------------------------------------ Left atrium: The atrium was mildly dilated.  ------------------------------------------------------------ Atrial septum: No defect or patent foramen ovale was identified.  ------------------------------------------------------------ Right ventricle:  The cavity size was normal. Wall thickness was normal. Systolic function was normal.  ------------------------------------------------------------ Pulmonic valve: Doppler: Trivial regurgitation.  ------------------------------------------------------------ Tricuspid valve: Doppler: Mild regurgitation.  ------------------------------------------------------------ Right atrium: The atrium was normal in size.  ------------------------------------------------------------ Pericardium: The pericardium was normal in appearance.  ------------------------------------------------------------ Post procedure conclusions Ascending Aorta:  - The aorta was normal, not dilated, and non-diseased.  ASSESSMENT AND PLAN: 1. Bicuspid valve aortic stenosis status post mechanical aortic valve replacement. The patient underwent an echocardiogram this morning in the office and I have reviewed that study. The mean gradient across her aortic valve has increased from 16 mm on last years study to approximately 40 mm mercury on this year's study. The valve is not well visualized because of poor acoustic windows. 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 She has been compliant with warfarin and INRs have been slightly subtherapeutic at times but the lowest INR recorded as 1.9. She has also remained on antiplatelet therapy with aspirin. I have reviewed the risks and indication of transesophageal echocardiography with the patient. Note last time she underwent TEE she had a difficult time tolerating the procedure and I would recommend liberal sedation. Will set up with Dr. Ross next week. Also may consider fluoroscopic evaluation depending on TEE findings.  2. Hyperlipidemia. Lifestyle modification discussed with the patient. Patient has no history of coronary artery disease.  [name] 09/10/2012 9:10 AM      

## 2012-09-13 ENCOUNTER — Other Ambulatory Visit: Payer: Self-pay | Admitting: Cardiovascular Disease

## 2012-09-16 ENCOUNTER — Telehealth: Payer: Self-pay | Admitting: Cardiovascular Disease

## 2012-09-16 NOTE — Telephone Encounter (Signed)
I spoke with the pt and her older brother (38) had a "posterior myocardial infarction" 10-Jan-2023 night and passed away. Her brother lives in [place] and an autopsy was performed.  The coroner told the pt that she should make her cardiologist aware of this information because this is hereditary. Per the pt her brother was healthy and did not see a cardiologist or have regular follow-up.  I made the pt aware that she needs to obtain a copy of the autopsy report for Dr Excell Seltzer to review. The pt agreed with plan and is scheduled to follow-up with Dr Excell Seltzer 10/06/12 to discuss her TEE.

## 2012-09-16 NOTE — Telephone Encounter (Signed)
Pt has some family history she thinks she needs to know prior to procedure on Friday. Per pt her brother just passed away of a heart attack and she wants to tell you what kind

## 2012-09-18 ENCOUNTER — Ambulatory Visit (HOSPITAL_COMMUNITY)
Admission: RE | Admit: 2012-09-18 | Discharge: 2012-09-18 | Disposition: A | Discharge status: Home / Self Care | Payer: Commercial Managed Care - PPO | Source: Ambulatory Visit | Attending: Internal Medicine | Admitting: Internal Medicine

## 2012-09-18 ENCOUNTER — Encounter (HOSPITAL_COMMUNITY): Payer: Self-pay | Admitting: *Deleted

## 2012-09-18 ENCOUNTER — Encounter (HOSPITAL_COMMUNITY)
Admission: RE | Disposition: A | Discharge status: Home / Self Care | Payer: Self-pay | Source: Ambulatory Visit | Attending: Internal Medicine

## 2012-09-18 DIAGNOSIS — I359 Nonrheumatic aortic valve disorder, unspecified: Secondary | ICD-10-CM

## 2012-09-18 DIAGNOSIS — Q2111 Secundum atrial septal defect: Secondary | ICD-10-CM | POA: Insufficient documentation

## 2012-09-18 DIAGNOSIS — Q211 Atrial septal defect: Secondary | ICD-10-CM | POA: Insufficient documentation

## 2012-09-18 DIAGNOSIS — I059 Rheumatic mitral valve disease, unspecified: Secondary | ICD-10-CM | POA: Insufficient documentation

## 2012-09-18 HISTORY — PX: TEE WITHOUT CARDIOVERSION: SHX5443

## 2012-09-18 SURGERY — ECHOCARDIOGRAM, TRANSESOPHAGEAL
Anesthesia: Moderate Sedation

## 2012-09-18 MED ORDER — MIDAZOLAM HCL 5 MG/ML IJ SOLN
INTRAMUSCULAR | Status: AC
  Filled 2012-09-18: qty 1

## 2012-09-18 MED ORDER — LIDOCAINE VISCOUS 2 % MT SOLN
OROMUCOSAL | Status: DC | PRN
  Administered 2012-09-18: 20 mL via OROMUCOSAL

## 2012-09-18 MED ORDER — DIPHENHYDRAMINE HCL 50 MG/ML IJ SOLN
INTRAMUSCULAR | Status: AC
  Filled 2012-09-18: qty 1

## 2012-09-18 MED ORDER — DIPHENHYDRAMINE HCL 50 MG/ML IJ SOLN
INTRAMUSCULAR | Status: DC | PRN
  Administered 2012-09-18 (×2): 25 mg via INTRAVENOUS

## 2012-09-18 MED ORDER — FENTANYL CITRATE 0.05 MG/ML IJ SOLN
INTRAMUSCULAR | Status: AC
  Filled 2012-09-18: qty 2

## 2012-09-18 MED ORDER — SODIUM CHLORIDE 0.9 % IV SOLN
INTRAVENOUS | Status: DC
  Administered 2012-09-18: 12:00:00 via INTRAVENOUS

## 2012-09-18 MED ORDER — FENTANYL CITRATE 0.05 MG/ML IJ SOLN
INTRAMUSCULAR | Status: DC | PRN
  Administered 2012-09-18: 12.5 ug via INTRAVENOUS
  Administered 2012-09-18: 25 ug via INTRAVENOUS
  Administered 2012-09-18: 12.5 ug via INTRAVENOUS
  Administered 2012-09-18 (×2): 25 ug via INTRAVENOUS
  Administered 2012-09-18 (×2): 12.5 ug via INTRAVENOUS

## 2012-09-18 MED ORDER — MIDAZOLAM HCL 5 MG/ML IJ SOLN
INTRAMUSCULAR | Status: AC
  Filled 2012-09-18: qty 2

## 2012-09-18 MED ORDER — MIDAZOLAM HCL 10 MG/2ML IJ SOLN
INTRAMUSCULAR | Status: DC | PRN
  Administered 2012-09-18: 2 mg via INTRAVENOUS
  Administered 2012-09-18: 1 mg via INTRAVENOUS
  Administered 2012-09-18 (×5): 2 mg via INTRAVENOUS
  Administered 2012-09-18: 1 mg via INTRAVENOUS

## 2012-09-18 MED ORDER — LIDOCAINE VISCOUS 2 % MT SOLN
OROMUCOSAL | Status: AC
  Filled 2012-09-18: qty 15

## 2012-09-18 NOTE — H&P (View-Only) (Signed)
HPI:   57 year old patient presenting for followup evaluation. The patient has a history of severe bicuspid aortic stenosis and she underwent mechanical aortic valve replacement in November 2012 with a 19 mm mechanical bileaflet valve. Her postoperative echo demonstrated a mean gradient of 15 mm mercury. She had postoperative sternal pain several months out from surgery and was evaluated with a chest CT which demonstrated normal union of the sternum. Recent labs showed a cholesterol of 192, triglycerides 253, HDL 51, and LDL 106. Hemoglobin A1c was 7.4. Thyroid studies were normal.  The patient has been working a lot. She's not been exercising regularly. Overall she feels well. She denies chest pain or pressure, lightheadedness, or syncope. Her sternal chest pain has resolved. She does have mild exertional dyspnea. She denies orthopnea, PND, or edema.  Outpatient Encounter Prescriptions as of 09/10/2012  Medication Sig Dispense Refill  . aspirin 81 MG tablet Take 81 mg by mouth daily.      . CRESTOR 20 MG tablet TAKE 1 TABLET BY MOUTH ONCE A DAY  90 tablet  3  . metFORMIN (GLUCOPHAGE) 500 MG tablet Take 1 tablet (500 mg total) by mouth 2 (two) times daily with a meal.  180 tablet  3  . metoprolol tartrate (LOPRESSOR) 25 MG tablet Take 0.5 tablets (12.5 mg total) by mouth 2 (two) times daily.  90 tablet  3  . warfarin (COUMADIN) 5 MG tablet TAKE ONE TABLET BY MOUTH EVERY DAY AS DIRECTED  30 tablet  2    Allergies  Allergen Reactions  . Atorvastatin     REACTION: myalgias Lipitor  . Codeine     REACTION: N/V  . Morphine And Related Nausea And Vomiting  . Niacin     REACTION: flushing  . Percocet (Oxycodone-Acetaminophen) Other (See Comments)    "blisters in mouth took in 1995"    Past Medical History  Diagnosis Date  . Hyperlipidemia   . Multinodular thyroid   . GERD (gastroesophageal reflux disease)   . Hx of hysterectomy   . Palpitation   . Aortic stenosis   . Heart murmur    aortic stenosis - dr. Excell Seltzer is her cardiologist  . Angina     chest tightness and pressure due to aortic stenosis  . Shortness of breath     associated with as  . Diabetes mellitus     newly diagnosed in july, 2012  . Headache     migraines when younger  . PONV (postoperative nausea and vomiting)    ROS: Negative except as per HPI  BP 128/78  Pulse 71  Ht 5\' 2"  (1.575 m)  Wt 85.73 kg (189 lb)  BMI 34.57 kg/m2  SpO2 99%  PHYSICAL EXAM: Pt is alert and oriented, NAD HEENT: normal Neck: JVP - normal, carotids 2+= without bruits Lungs: CTA bilaterally CV: RRR with grade 2/6 systolic murmur at the right upper sternal border and crisp A2 closure sound Abd: soft, NT, Positive BS, no hepatomegaly Ext: no C/C/E, distal pulses intact and equal Skin: warm/dry no rash  EKG:  Normal sinus rhythm 71 beats per minute, single PVC noted, nonspecific T wave abnormality.  2-D echo 09/19/2011: Left ventricle: The cavity size was normal. Wall thickness was increased in a pattern of moderate LVH. Systolic function was normal. The estimated ejection fraction was in the range of 55% to 60%.  ------------------------------------------------------------ Aortic valve: Mechanical AVR not well visualized No regurgitaton. Post PVC gradients seem a little high Doppler: Mean gradient: 16mm Hg (S).  Peak gradient: 25mm Hg (S).  ------------------------------------------------------------ Aorta: The aorta was normal, not dilated, and non-diseased.  ------------------------------------------------------------ Mitral valve: Mildly thickened leaflets . Doppler: Peak gradient: 4mm Hg (D).  ------------------------------------------------------------ Left atrium: The atrium was mildly dilated.  ------------------------------------------------------------ Atrial septum: No defect or patent foramen ovale was identified.  ------------------------------------------------------------ Right ventricle:  The cavity size was normal. Wall thickness was normal. Systolic function was normal.  ------------------------------------------------------------ Pulmonic valve: Doppler: Trivial regurgitation.  ------------------------------------------------------------ Tricuspid valve: Doppler: Mild regurgitation.  ------------------------------------------------------------ Right atrium: The atrium was normal in size.  ------------------------------------------------------------ Pericardium: The pericardium was normal in appearance.  ------------------------------------------------------------ Post procedure conclusions Ascending Aorta:  - The aorta was normal, not dilated, and non-diseased.  ASSESSMENT AND PLAN: 1. Bicuspid valve aortic stenosis status post mechanical aortic valve replacement. The patient underwent an echocardiogram this morning in the office and I have reviewed that study. The mean gradient across her aortic valve has increased from 16 mm on last years study to approximately 40 mm mercury on this year's study. The valve is not well visualized because of poor acoustic windows. 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 She has been compliant with warfarin and INRs have been slightly subtherapeutic at times but the lowest INR recorded as 1.9. She has also remained on antiplatelet therapy with aspirin. I have reviewed the risks and indication of transesophageal echocardiography with the patient. Note last time she underwent TEE she had a difficult time tolerating the procedure and I would recommend liberal sedation. Will set up with Dr. Tenny Craw next week. Also may consider fluoroscopic evaluation depending on TEE findings.  2. Hyperlipidemia. Lifestyle modification discussed with the patient. Patient has no history of coronary artery disease.  Tonny Bollman 09/10/2012 9:10 AM

## 2012-09-18 NOTE — Progress Notes (Signed)
  Echocardiogram Echocardiogram Transesophageal has been performed.  Georgian Co 09/18/2012, 4:08 PM

## 2012-09-18 NOTE — Interval H&P Note (Signed)
History and Physical Interval Note:  09/18/2012 12:49 PM  [name]  has presented today for surgery, with the diagnosis of avp/aortic stenosis  The various methods of treatment have been discussed with the patient and family. After consideration of risks, benefits and other options for treatment, the patient has consented to  Procedure(s) (LRB) with comments: TRANSESOPHAGEAL ECHOCARDIOGRAM (TEE) (N/A) as a surgical intervention .  The patient's history has been reviewed, patient examined, no change in status, stable for surgery.  I have reviewed the patient's chart and labs.  Questions were answered to the patient's satisfaction.     Dietrich Pates

## 2012-09-18 NOTE — Op Note (Signed)
Full report to follow 

## 2012-09-21 ENCOUNTER — Encounter (HOSPITAL_COMMUNITY): Payer: Self-pay | Admitting: Internal Medicine

## 2012-09-22 ENCOUNTER — Telehealth: Payer: Self-pay | Admitting: Cardiovascular Disease

## 2012-09-22 NOTE — Telephone Encounter (Signed)
I have rescheduled this pt's appointment with Dr Excell Seltzer to 09/25/12.

## 2012-09-22 NOTE — Telephone Encounter (Signed)
Pt was called by Dr. Tenny Craw regarding TEE and was to get a sooner appt with Dr. Excell Seltzer and she has not heard anything yet

## 2012-09-24 ENCOUNTER — Telehealth: Payer: Self-pay | Admitting: Cardiovascular Disease

## 2012-09-24 NOTE — Telephone Encounter (Signed)
New Problem:    When calling patient to reschedule appt due to the office closing on 09/25/12 patient declined to see PA/NP the week of 2/17 while Dr. Excell Seltzer was in the office.  Patient also did not wish to wait until 11/2012 to be seen.  Patient stated she only wished to see Dr. Excell Seltzer.  Please call back once you have returned to the office.

## 2012-09-25 ENCOUNTER — Ambulatory Visit: Payer: Commercial Managed Care - PPO | Admitting: Cardiovascular Disease

## 2012-09-28 NOTE — Telephone Encounter (Signed)
Notes Recorded by Tonny Bollman, MD on 09/25/2012 at 11:50 AM Reviewed result with patient. Plan repeat 2D Echo in 6 months and office visit to follow. She doesn't need appt in April. She will call if symptoms occur prior to 6 months follow-up. I have reviewed serial echo studies with Dr Tenny Craw. Advised patient to stay well-hydrated because of hyperdynamic LV function.  I spoke with pt and made her aware that I have placed reminders in the system for echocardiogram and office visit in 6 months.

## 2012-09-30 ENCOUNTER — Encounter: Payer: Self-pay | Admitting: Cardiovascular Disease

## 2012-10-06 ENCOUNTER — Ambulatory Visit: Payer: Commercial Managed Care - PPO | Admitting: Cardiovascular Disease

## 2012-10-07 ENCOUNTER — Ambulatory Visit (INDEPENDENT_AMBULATORY_CARE_PROVIDER_SITE_OTHER): Payer: Commercial Managed Care - PPO | Admitting: *Deleted

## 2012-10-07 DIAGNOSIS — I359 Nonrheumatic aortic valve disorder, unspecified: Secondary | ICD-10-CM

## 2012-10-07 DIAGNOSIS — Z954 Presence of other heart-valve replacement: Secondary | ICD-10-CM

## 2012-10-07 DIAGNOSIS — Z7901 Long term (current) use of anticoagulants: Secondary | ICD-10-CM

## 2012-10-07 DIAGNOSIS — Z952 Presence of prosthetic heart valve: Secondary | ICD-10-CM

## 2012-10-07 LAB — POCT INR: INR: 1.9

## 2012-10-21 ENCOUNTER — Ambulatory Visit (INDEPENDENT_AMBULATORY_CARE_PROVIDER_SITE_OTHER): Payer: Commercial Managed Care - PPO | Admitting: *Deleted

## 2012-10-21 DIAGNOSIS — I359 Nonrheumatic aortic valve disorder, unspecified: Secondary | ICD-10-CM

## 2012-10-21 DIAGNOSIS — Z954 Presence of other heart-valve replacement: Secondary | ICD-10-CM

## 2012-10-21 DIAGNOSIS — Z7901 Long term (current) use of anticoagulants: Secondary | ICD-10-CM

## 2012-10-21 DIAGNOSIS — Z952 Presence of prosthetic heart valve: Secondary | ICD-10-CM

## 2012-10-21 LAB — POCT INR: INR: 2.7

## 2012-11-17 ENCOUNTER — Encounter: Payer: Self-pay | Admitting: Cardiovascular Disease

## 2012-11-19 ENCOUNTER — Ambulatory Visit (INDEPENDENT_AMBULATORY_CARE_PROVIDER_SITE_OTHER): Payer: Commercial Managed Care - PPO | Admitting: *Deleted

## 2012-11-19 DIAGNOSIS — Z7901 Long term (current) use of anticoagulants: Secondary | ICD-10-CM

## 2012-11-19 DIAGNOSIS — Z952 Presence of prosthetic heart valve: Secondary | ICD-10-CM

## 2012-11-19 DIAGNOSIS — Z954 Presence of other heart-valve replacement: Secondary | ICD-10-CM

## 2012-11-19 DIAGNOSIS — I359 Nonrheumatic aortic valve disorder, unspecified: Secondary | ICD-10-CM

## 2012-11-19 LAB — POCT INR: INR: 2.5

## 2012-12-17 ENCOUNTER — Ambulatory Visit (INDEPENDENT_AMBULATORY_CARE_PROVIDER_SITE_OTHER): Payer: Commercial Managed Care - PPO | Admitting: *Deleted

## 2012-12-17 DIAGNOSIS — Z954 Presence of other heart-valve replacement: Secondary | ICD-10-CM

## 2012-12-17 DIAGNOSIS — Z7901 Long term (current) use of anticoagulants: Secondary | ICD-10-CM

## 2012-12-17 DIAGNOSIS — Z952 Presence of prosthetic heart valve: Secondary | ICD-10-CM

## 2012-12-17 DIAGNOSIS — I359 Nonrheumatic aortic valve disorder, unspecified: Secondary | ICD-10-CM

## 2013-01-10 ENCOUNTER — Other Ambulatory Visit: Payer: Self-pay | Admitting: Cardiovascular Disease

## 2013-01-13 ENCOUNTER — Ambulatory Visit (INDEPENDENT_AMBULATORY_CARE_PROVIDER_SITE_OTHER): Payer: Commercial Managed Care - PPO | Admitting: Cardiovascular Disease

## 2013-01-13 ENCOUNTER — Other Ambulatory Visit: Payer: Self-pay

## 2013-01-13 ENCOUNTER — Ambulatory Visit (INDEPENDENT_AMBULATORY_CARE_PROVIDER_SITE_OTHER): Payer: Commercial Managed Care - PPO | Admitting: *Deleted

## 2013-01-13 ENCOUNTER — Other Ambulatory Visit (HOSPITAL_COMMUNITY): Payer: Self-pay | Admitting: Cardiovascular Disease

## 2013-01-13 ENCOUNTER — Ambulatory Visit (HOSPITAL_COMMUNITY): Payer: Commercial Managed Care - PPO | Attending: Cardiology | Admitting: Radiology

## 2013-01-13 ENCOUNTER — Encounter: Payer: Self-pay | Admitting: Cardiovascular Disease

## 2013-01-13 VITALS — BP 118/78 | HR 60 | Ht 62.0 in | Wt 186.0 lb

## 2013-01-13 DIAGNOSIS — I359 Nonrheumatic aortic valve disorder, unspecified: Secondary | ICD-10-CM

## 2013-01-13 DIAGNOSIS — Z954 Presence of other heart-valve replacement: Secondary | ICD-10-CM

## 2013-01-13 DIAGNOSIS — E785 Hyperlipidemia, unspecified: Secondary | ICD-10-CM

## 2013-01-13 DIAGNOSIS — Z7901 Long term (current) use of anticoagulants: Secondary | ICD-10-CM

## 2013-01-13 DIAGNOSIS — Z952 Presence of prosthetic heart valve: Secondary | ICD-10-CM

## 2013-01-13 LAB — POCT INR: INR: 2.4

## 2013-01-13 NOTE — Progress Notes (Signed)
HPI:  57 year old woman presenting for followup evaluation. The patient has a history of severe bicuspid valve aortic stenosis and she underwent mechanical aortic valve replacement in 2012 with a 19 mm mechanical bileaflet valve.  Overall she is doing well. She follows a healthy diet. She's not been engaged in regular exercise. She works long hours. She is intolerant to heat and has difficulty doing work in the middle of the day. She's able to walk and garden in the morning and evening without difficulty. She denies chest pain or pressure, unless it is a very hot day. She's not limited by dyspnea. She reports no episodes of lightheadedness or syncope.  Outpatient Encounter Prescriptions as of 01/13/2013  Medication Sig Dispense Refill  . aspirin 81 MG tablet Take 81 mg by mouth daily.      . CRESTOR 20 MG tablet TAKE 1 TABLET BY MOUTH ONCE A DAY  90 tablet  3  . metFORMIN (GLUCOPHAGE) 500 MG tablet Take 1 tablet (500 mg total) by mouth 2 (two) times daily with a meal.  180 tablet  3  . metoprolol tartrate (LOPRESSOR) 25 MG tablet TAKE ONE-HALF TABLET BY MOUTH TWICE DAILY  90 tablet  2  . warfarin (COUMADIN) 5 MG tablet TAKE ONE TABLET BY MOUTH ONE TIME DAILY  30 tablet  3   No facility-administered encounter medications on file as of 01/13/2013.    Allergies  Allergen Reactions  . Atorvastatin     REACTION: myalgias Lipitor  . Codeine     REACTION: N/V  . Morphine And Related Nausea And Vomiting  . Niacin     REACTION: flushing  . Percocet (Oxycodone-Acetaminophen) Other (See Comments)    "blisters in mouth took in 1995"    Past Medical History  Diagnosis Date  . Hyperlipidemia   . Multinodular thyroid   . GERD (gastroesophageal reflux disease)   . Hx of hysterectomy   . Palpitation   . Aortic stenosis   . Heart murmur     aortic stenosis - dr. Excell Seltzer is her cardiologist  . Angina     chest tightness and pressure due to aortic stenosis  . Shortness of breath     associated  with as  . Diabetes mellitus     newly diagnosed in july, 2012  . Headache(784.0)     migraines when younger  . PONV (postoperative nausea and vomiting)     ROS: Negative except as per HPI  BP 118/78  Pulse 60  Ht 5\' 2"  (1.575 m)  Wt 84.369 kg (186 lb)  BMI 34.01 kg/m2  PHYSICAL EXAM: Pt is alert and oriented, NAD HEENT: normal Neck: JVP - normal Lungs: CTA bilaterally CV: RRR with grade 2/6 ejection murmur at the right upper sternal border and a normal mechanical A2 Abd: soft, NT, Positive BS, no hepatomegaly Ext: no C/C/E, distal pulses intact and equal Skin: warm/dry no rash  EKG:  Normal sinus rhythm with occasional PVC, low-voltage QRS, otherwise within normal limits. Heart rate 60 beats per minute.  ASSESSMENT AND PLAN: 1. Bicuspid aortic valve stenosis status post mechanical aortic valve replacement. I personally reviewed her echo images this morning. She has hyperdynamic LV function. Her transvalvular gradients are stable with a mean gradient of 35-40 mm mercury. This is unchanged from her previous study. As she is clinically stable, I will see her back in followup in 6 months. We'll repeat an echocardiogram in one year. She's tolerating the combination of warfarin and low-dose aspirin without bleeding  problems.  2. Hyperlipidemia. She remains on Crestor. Lipids from January showed cholesterol 192, LDL 106, HDL 51.  Tonny Bollman 01/13/2013 9:46 AM

## 2013-01-13 NOTE — Patient Instructions (Signed)
Your physician wants you to follow-up in: 6 MONTHS with Dr Cooper.  You will receive a reminder letter in the mail two months in advance. If you don't receive a letter, please call our office to schedule the follow-up appointment.  Your physician recommends that you continue on your current medications as directed. Please refer to the Current Medication list given to you today.  

## 2013-01-13 NOTE — Progress Notes (Signed)
Echocardiogram performed.  

## 2013-02-22 ENCOUNTER — Other Ambulatory Visit: Payer: Self-pay | Admitting: Internal Medicine

## 2013-02-25 ENCOUNTER — Ambulatory Visit (INDEPENDENT_AMBULATORY_CARE_PROVIDER_SITE_OTHER): Payer: Commercial Managed Care - PPO | Admitting: *Deleted

## 2013-02-25 DIAGNOSIS — I359 Nonrheumatic aortic valve disorder, unspecified: Secondary | ICD-10-CM

## 2013-02-25 DIAGNOSIS — Z7901 Long term (current) use of anticoagulants: Secondary | ICD-10-CM

## 2013-02-25 DIAGNOSIS — Z952 Presence of prosthetic heart valve: Secondary | ICD-10-CM

## 2013-02-25 DIAGNOSIS — Z954 Presence of other heart-valve replacement: Secondary | ICD-10-CM

## 2013-03-02 ENCOUNTER — Other Ambulatory Visit: Payer: Self-pay

## 2013-03-02 DIAGNOSIS — Z1231 Encounter for screening mammogram for malignant neoplasm of breast: Secondary | ICD-10-CM

## 2013-03-03 ENCOUNTER — Other Ambulatory Visit (INDEPENDENT_AMBULATORY_CARE_PROVIDER_SITE_OTHER): Payer: Commercial Managed Care - PPO

## 2013-03-03 DIAGNOSIS — IMO0001 Reserved for inherently not codable concepts without codable children: Secondary | ICD-10-CM

## 2013-03-03 DIAGNOSIS — Z0000 Encounter for general adult medical examination without abnormal findings: Secondary | ICD-10-CM

## 2013-03-03 DIAGNOSIS — E785 Hyperlipidemia, unspecified: Secondary | ICD-10-CM

## 2013-03-03 LAB — CBC WITH DIFFERENTIAL/PLATELET
Basophils Absolute: 0 10*3/uL (ref 0.0–0.1)
Basophils Relative: 0.6 % (ref 0.0–3.0)
Eosinophils Absolute: 0.1 10*3/uL (ref 0.0–0.7)
HCT: 40.3 % (ref 36.0–46.0)
Hemoglobin: 13.6 g/dL (ref 12.0–15.0)
Lymphs Abs: 2.4 10*3/uL (ref 0.7–4.0)
MCHC: 33.8 g/dL (ref 30.0–36.0)
MCV: 88.9 fl (ref 78.0–100.0)
Monocytes Absolute: 0.6 10*3/uL (ref 0.1–1.0)
Neutro Abs: 4.4 10*3/uL (ref 1.4–7.7)
RBC: 4.54 Mil/uL (ref 3.87–5.11)
RDW: 14.3 % (ref 11.5–14.6)

## 2013-03-03 LAB — BASIC METABOLIC PANEL
CO2: 27 mEq/L (ref 19–32)
Calcium: 8.9 mg/dL (ref 8.4–10.5)
Creatinine, Ser: 0.7 mg/dL (ref 0.4–1.2)
Sodium: 141 mEq/L (ref 135–145)

## 2013-03-03 LAB — LIPID PANEL
Total CHOL/HDL Ratio: 4
Triglycerides: 238 mg/dL — ABNORMAL HIGH (ref 0.0–149.0)
VLDL: 47.6 mg/dL — ABNORMAL HIGH (ref 0.0–40.0)

## 2013-03-03 LAB — URINALYSIS, ROUTINE W REFLEX MICROSCOPIC
Leukocytes, UA: NEGATIVE
Nitrite: NEGATIVE
Specific Gravity, Urine: <=1.005 (ref 1.000–1.030)
pH: 6 (ref 5.0–8.0)

## 2013-03-03 LAB — HEPATIC FUNCTION PANEL
Alkaline Phosphatase: 74 U/L (ref 39–117)
Bilirubin, Direct: 0.1 mg/dL (ref 0.0–0.3)
Total Bilirubin: 0.5 mg/dL (ref 0.3–1.2)
Total Protein: 7 g/dL (ref 6.0–8.3)

## 2013-03-03 LAB — MICROALBUMIN / CREATININE URINE RATIO: Microalb, Ur: 0.3 mg/dL (ref 0.0–1.9)

## 2013-03-03 LAB — LDL CHOLESTEROL, DIRECT: Direct LDL: 93.3 mg/dL

## 2013-03-03 LAB — HEMOGLOBIN A1C: Hgb A1c MFr Bld: 6.9 % — ABNORMAL HIGH (ref 4.6–6.5)

## 2013-03-10 ENCOUNTER — Other Ambulatory Visit (INDEPENDENT_AMBULATORY_CARE_PROVIDER_SITE_OTHER): Payer: Commercial Managed Care - PPO

## 2013-03-10 ENCOUNTER — Encounter: Payer: Self-pay | Admitting: Internal Medicine

## 2013-03-10 ENCOUNTER — Ambulatory Visit (INDEPENDENT_AMBULATORY_CARE_PROVIDER_SITE_OTHER): Payer: Commercial Managed Care - PPO | Admitting: Internal Medicine

## 2013-03-10 VITALS — BP 120/70 | HR 65 | Temp 97.9°F | Ht 62.0 in | Wt 182.1 lb

## 2013-03-10 DIAGNOSIS — E041 Nontoxic single thyroid nodule: Secondary | ICD-10-CM

## 2013-03-10 DIAGNOSIS — Z0000 Encounter for general adult medical examination without abnormal findings: Secondary | ICD-10-CM

## 2013-03-10 DIAGNOSIS — J069 Acute upper respiratory infection, unspecified: Secondary | ICD-10-CM

## 2013-03-10 DIAGNOSIS — IMO0001 Reserved for inherently not codable concepts without codable children: Secondary | ICD-10-CM

## 2013-03-10 MED ORDER — METFORMIN HCL 850 MG PO TABS: 850.0000 mg | ORAL_TABLET | Freq: Two times a day (BID) | ORAL | Status: DC

## 2013-03-10 MED ORDER — AZITHROMYCIN 250 MG PO TABS: ORAL_TABLET | ORAL | Status: DC

## 2013-03-10 NOTE — Patient Instructions (Signed)
Please take all new medication as prescribed - the antibiotic OK to increase the metformin to 850 mg twice per day Please continue all other medications as before, and refills have been done if requested. Please have the pharmacy call with any other refills you may need. Please continue your efforts at being more active, low cholesterol diet, and weight control. You are otherwise up to date with prevention measures today. You will be contacted regarding the referral for: thyroid ultrasound Please go to the LAB in the Basement (turn left off the elevator) for the tests to be done today - just the FREE T4 You will be contacted by phone if any changes need to be made immediately.  Otherwise, you will receive a letter about your results with an explanation, but please check with MyChart first.  Please remember to sign up for My Chart if you have not done so, as this will be important to you in the future with finding out test results, communicating by private email, and scheduling acute appointments online when needed.  Please return in 1 year for your yearly visit, or sooner if needed, with Lab testing done 3-5 days before

## 2013-03-10 NOTE — Assessment & Plan Note (Signed)
Mild to mod, for antibx course,  to f/u any worsening symptoms or concerns 

## 2013-03-10 NOTE — Progress Notes (Signed)
Subjective:    Patient ID: [name], female    DOB: 1956-01-26, 57 y.o.   MRN: [id number]  HPI  Here for wellness and f/u;  Overall doing ok;  Pt denies CP, worsening SOB, DOE, wheezing, orthopnea, PND, worsening LE edema, palpitations, dizziness or syncope.  Pt denies neurological change such as new headache, facial or extremity weakness.  Pt denies polydipsia, polyuria, or low sugar symptoms. Pt states overall good compliance with treatment and medications, good tolerability, and has been trying to follow lower cholesterol diet.  Pt denies worsening depressive symptoms, suicidal ideation or panic. No fever, night sweats, wt loss, loss of appetite, or other constitutional symptoms.  Pt states good ability with ADL's, has low fall risk, home safety reviewed and adequate, no other significant changes in hearing or vision, and only occasionally active with exercise.  Also with ST and URI symptoms for 5 days.  Very difficult to lose wt though she walks daily and tries to watch her diet.  Mentions thyroid nodule and asks for f/u u/s and free t4 inaddition to her normal TSH today Past Medical History  Diagnosis Date  . Hyperlipidemia   . Multinodular thyroid   . GERD (gastroesophageal reflux disease)   . Hx of hysterectomy   . Palpitation   . Aortic stenosis   . Heart murmur     aortic stenosis - dr. Excell Seltzer is her cardiologist  . Angina     chest tightness and pressure due to aortic stenosis  . Shortness of breath     associated with as  . Diabetes mellitus     newly diagnosed in july, 2012  . Headache(784.0)     migraines when younger  . PONV (postoperative nausea and vomiting)    Past Surgical History  Procedure Laterality Date  . Cesarean section    . Bunionectomy    . Partial hysterectomy    . Abdominal hysterectomy    . Aortic valve replacement  06/25/2011    Procedure: AORTIC VALVE REPLACEMENT (AVR);  Surgeon: Kathlee Nations Suann Larry, MD;  Location: [facility] OR;  Service: Open Heart  Surgery;  Laterality: N/A;  . Cardiac valve replacement      Aortic Valve   06/25/2011  . Tee without cardioversion N/A 09/18/2012    Procedure: TRANSESOPHAGEAL ECHOCARDIOGRAM (TEE);  Surgeon: Pricilla Riffle, MD;  Location: [facility] ENDOSCOPY;  Service: Cardiovascular;  Laterality: N/A;    reports that she quit smoking about 18 years ago. Her smoking use included Cigarettes. She has a 15 pack-year smoking history. She has never used smokeless tobacco. She reports that she does not drink alcohol or use illicit drugs. family history includes COPD in her father; Cancer (age of onset: 72) in her mother; Coronary artery disease in her father; Diabetes in her mother; and Hypertension in her other. Allergies  Allergen Reactions  . Atorvastatin     REACTION: myalgias Lipitor  . Codeine     REACTION: N/V  . Morphine And Related Nausea And Vomiting  . Niacin     REACTION: flushing  . Percocet (Oxycodone-Acetaminophen) Other (See Comments)    "blisters in mouth took in 1995"   Current Outpatient Prescriptions on File Prior to Visit  Medication Sig Dispense Refill  . aspirin 81 MG tablet Take 81 mg by mouth daily.      . CRESTOR 20 MG tablet TAKE 1 TABLET BY MOUTH ONCE A DAY  90 tablet  1  . metoprolol tartrate (LOPRESSOR) 25 MG tablet TAKE ONE-HALF  TABLET BY MOUTH TWICE DAILY  90 tablet  2  . warfarin (COUMADIN) 5 MG tablet TAKE ONE TABLET BY MOUTH ONE TIME DAILY  30 tablet  3  . [DISCONTINUED] metoprolol succinate (TOPROL-XL) 25 MG 24 hr tablet Take 1 tablet (25 mg total) by mouth at bedtime.  30 tablet  11   No current facility-administered medications on file prior to visit.   Review of Systems Constitutional: Negative for diaphoresis, activity change, appetite change or unexpected weight change.  HENT: Negative for hearing loss, ear pain, facial swelling, mouth sores and neck stiffness.   Eyes: Negative for pain, redness and visual disturbance.  Respiratory: Negative for shortness of breath and  wheezing.   Cardiovascular: Negative for chest pain and palpitations.  Gastrointestinal: Negative for diarrhea, blood in stool, abdominal distention or other pain Genitourinary: Negative for hematuria, flank pain or change in urine volume.  Musculoskeletal: Negative for myalgias and joint swelling.  Skin: Negative for color change and wound.  Neurological: Negative for syncope and numbness. other than noted Hematological: Negative for adenopathy.  Psychiatric/Behavioral: Negative for hallucinations, self-injury, decreased concentration and agitation.      Objective:   Physical Exam BP 120/70  Pulse 65  Temp(Src) 97.9 F (36.6 C) (Oral)  Ht 5\' 2"  (1.575 m)  Wt 182 lb 2 oz (82.611 kg)  BMI 33.3 kg/m2  SpO2 97% VS noted,  Constitutional: Pt is oriented to person, place, and time. Appears well-developed and well-nourished.  Head: Normocephalic and atraumatic.  Right Ear: External ear normal.  Left Ear: External ear normal.  Nose: Nose normal.  Mouth/Throat: Oropharynx is clear and moist.  Eyes: Conjunctivae and EOM are normal. Pupils are equal, round, and reactive to light.  Neck: Normal range of motion. Neck supple. No JVD present. No tracheal deviation present.  Cardiovascular: Normal rate, regular rhythm, normal heart sounds and intact distal pulses.   Pulmonary/Chest: Effort normal and breath sounds normal.  Abdominal: Soft. Bowel sounds are normal. There is no tenderness. No HSM  Musculoskeletal: Normal range of motion. Exhibits no edema.  Lymphadenopathy:  Has no cervical adenopathy.  Neurological: Pt is alert and oriented to person, place, and time. Pt has normal reflexes. No cranial nerve deficit.  Skin: Skin is warm and dry. No rash noted.  Psychiatric:  Has  normal mood and affect. Behavior is normal.     Assessment & Plan:

## 2013-03-10 NOTE — Assessment & Plan Note (Signed)
Ok for thyroid u/s and free t4

## 2013-03-10 NOTE — Assessment & Plan Note (Signed)
Ok for increased metformin 850 bid, I wonder if may help possible PCOS as well, as she states hx of ovary cysts

## 2013-03-10 NOTE — Assessment & Plan Note (Signed)

## 2013-03-11 LAB — T4, FREE: Free T4: 0.75 ng/dL (ref 0.60–1.60)

## 2013-03-16 ENCOUNTER — Encounter: Payer: Self-pay | Admitting: Internal Medicine

## 2013-03-16 DIAGNOSIS — E049 Nontoxic goiter, unspecified: Secondary | ICD-10-CM

## 2013-03-17 ENCOUNTER — Ambulatory Visit
Admission: RE | Admit: 2013-03-17 | Discharge: 2013-03-17 | Disposition: A | Discharge status: Home / Self Care | Payer: Commercial Managed Care - PPO | Source: Ambulatory Visit | Attending: Internal Medicine | Admitting: Internal Medicine

## 2013-03-17 DIAGNOSIS — E041 Nontoxic single thyroid nodule: Secondary | ICD-10-CM

## 2013-04-06 ENCOUNTER — Encounter: Payer: Self-pay | Admitting: Endocrinology

## 2013-04-06 ENCOUNTER — Ambulatory Visit (INDEPENDENT_AMBULATORY_CARE_PROVIDER_SITE_OTHER): Payer: Commercial Managed Care - PPO | Admitting: Endocrinology

## 2013-04-06 VITALS — BP 128/72 | HR 78 | Ht 62.0 in | Wt 178.0 lb

## 2013-04-06 DIAGNOSIS — IMO0001 Reserved for inherently not codable concepts without codable children: Secondary | ICD-10-CM

## 2013-04-06 DIAGNOSIS — E041 Nontoxic single thyroid nodule: Secondary | ICD-10-CM

## 2013-04-06 MED ORDER — PIOGLITAZONE HCL 15 MG PO TABS: 15.0000 mg | ORAL_TABLET | Freq: Every day | ORAL | Status: DC

## 2013-04-06 NOTE — Patient Instructions (Addendum)
You should have the thyroid ultrasound rechecked in 1 year.  i have sent a prescription to your pharmacy, for "pioglitizone," to help the blood sugar and triglycerides. Please come back for a follow-up appointment in 3 months.

## 2013-04-06 NOTE — Progress Notes (Signed)
Subjective:    Patient ID: [name], female    DOB: [birth year], 57 y.o.   MRN: [id number]  HPI Pt was noted to have a slight prominence at the anterior neck, approx 1 month ago.  She has assoc muscle cramps. Past Medical History  Diagnosis Date  . Hyperlipidemia   . Multinodular thyroid   . GERD (gastroesophageal reflux disease)   . Hx of hysterectomy   . Palpitation   . Aortic stenosis   . Heart murmur     aortic stenosis - dr. Excell Seltzer is her cardiologist  . Angina     chest tightness and pressure due to aortic stenosis  . Shortness of breath     associated with as  . Diabetes mellitus     newly diagnosed in july, 2012  . Headache(784.0)     migraines when younger  . PONV (postoperative nausea and vomiting)     Past Surgical History  Procedure Laterality Date  . Cesarean section    . Bunionectomy    . Partial hysterectomy    . Abdominal hysterectomy    . Aortic valve replacement  06/25/2011    Procedure: AORTIC VALVE REPLACEMENT (AVR);  Surgeon: Kathlee Nations Suann Larry, MD;  Location: [facility] OR;  Service: Open Heart Surgery;  Laterality: N/A;  . Cardiac valve replacement      Aortic Valve   06/25/2011  . Tee without cardioversion N/A 09/18/2012    Procedure: TRANSESOPHAGEAL ECHOCARDIOGRAM (TEE);  Surgeon: Pricilla Riffle, MD;  Location: [facility] ENDOSCOPY;  Service: Cardiovascular;  Laterality: N/A;    History   Social History  . Marital Status: Married    Spouse Name: N/A    Number of Children: N/A  . Years of Education: N/A   Occupational History  . Not on file.   Social History Main Topics  . Smoking status: Former Smoker -- 1.00 packs/day for 15 years    Types: Cigarettes    Quit date: 08/12/1994  . Smokeless tobacco: Never Used  . Alcohol Use: No  . Drug Use: No  . Sexual Activity: Not on file   Other Topics Concern  . Not on file   Social History Narrative   Married - one daughter 22 y/o   Occupation: Employed as Production manager for [employer]    Current Outpatient  Prescriptions on File Prior to Visit  Medication Sig Dispense Refill  . aspirin 81 MG tablet Take 81 mg by mouth daily.      Marland Kitchen azithromycin (ZITHROMAX Z-PAK) 250 MG tablet Use as directed  6 each  1  . CRESTOR 20 MG tablet TAKE 1 TABLET BY MOUTH ONCE A DAY  90 tablet  1  . metFORMIN (GLUCOPHAGE) 850 MG tablet Take 1 tablet (850 mg total) by mouth 2 (two) times daily with a meal.  180 tablet  3  . metoprolol tartrate (LOPRESSOR) 25 MG tablet TAKE ONE-HALF TABLET BY MOUTH TWICE DAILY  90 tablet  2  . warfarin (COUMADIN) 5 MG tablet TAKE ONE TABLET BY MOUTH ONE TIME DAILY  30 tablet  3  . [DISCONTINUED] metoprolol succinate (TOPROL-XL) 25 MG 24 hr tablet Take 1 tablet (25 mg total) by mouth at bedtime.  30 tablet  11   No current facility-administered medications on file prior to visit.   Allergies  Allergen Reactions  . Atorvastatin     REACTION: myalgias Lipitor  . Codeine     REACTION: N/V  . Morphine And Related Nausea And Vomiting  . Niacin  REACTION: flushing  . Percocet [Oxycodone-Acetaminophen] Other (See Comments)    "blisters in mouth took in 1995"   Family History  Problem Relation Age of Onset  . Hypertension Other   . Coronary artery disease Father   . COPD Father   . Diabetes Mother   . Cancer Mother 30    bile duct cancer  mother had uncertain type of thyroid problem.   Maternal grandmother had goiter. BP 128/72  Pulse 78  Ht 5\' 2"  (1.575 m)  Wt 178 lb (80.74 kg)  BMI 32.55 kg/m2  SpO2 95%  Review of Systems denies depression, sob, fever, memory loss, constipation, blurry vision, rhinorrhea, easy bruising, and syncope.  She has difficulty losing weight.  She has myalgias, hair loss, dry skin, rhinorrhea, easy bruising, and menopausal sxs.  She has intermittent numbness at the right anterior thigh.    Objective:   Physical Exam VS: see vs page GEN: no distress HEAD: head: no deformity eyes: no periorbital swelling, no proptosis external nose and ears  are normal mouth: no lesion seen NECK: supple, thyroid is not enlarged CHEST WALL: no deformity LUNGS:  Clear to auscultation. CV: reg rate and rhythm, no murmur ABD: abdomen is soft, nontender.  no hepatosplenomegaly.  not distended.  no hernia MUSCULOSKELETAL: muscle bulk and strength are grossly normal.  no obvious joint swelling.  gait is normal and steady EXTEMITIES: no deformity.  no ulcer on the feet.  feet are of normal color and temp.  no edema PULSES: dorsalis pedis intact bilat.  no carotid bruit NEURO:  cn 2-12 grossly intact.   readily moves all 4's.  sensation is intact to touch on the feet SKIN:  Normal texture and temperature.  No rash or suspicious lesion is visible.   NODES:  None palpable at the neck PSYCH: alert, oriented x3.  Does not appear anxious nor depressed.  Lab Results  Component Value Date   TSH 2.74 03/03/2013   Lab Results  Component Value Date   HGBA1C 6.9* 03/03/2013   (i reviewed [place] report)    Assessment & Plan:  Small multinodular goiter, not big enough to need bx. DM: Needs increased rx, if it can be done with a regimen that avoids or minimizes hypoglycemia. Hypertriglyceridemia.  In this setting, she should consider pioglitizone as her add-on DM med.

## 2013-04-07 ENCOUNTER — Ambulatory Visit (INDEPENDENT_AMBULATORY_CARE_PROVIDER_SITE_OTHER): Payer: Commercial Managed Care - PPO | Admitting: *Deleted

## 2013-04-07 DIAGNOSIS — Z954 Presence of other heart-valve replacement: Secondary | ICD-10-CM

## 2013-04-07 DIAGNOSIS — Z7901 Long term (current) use of anticoagulants: Secondary | ICD-10-CM

## 2013-04-07 DIAGNOSIS — Z952 Presence of prosthetic heart valve: Secondary | ICD-10-CM

## 2013-04-07 DIAGNOSIS — I359 Nonrheumatic aortic valve disorder, unspecified: Secondary | ICD-10-CM

## 2013-04-07 LAB — POCT INR: INR: 2.2

## 2013-04-14 ENCOUNTER — Ambulatory Visit: Payer: Commercial Managed Care - PPO

## 2013-04-16 ENCOUNTER — Ambulatory Visit
Admission: RE | Admit: 2013-04-16 | Discharge: 2013-04-16 | Disposition: A | Discharge status: Home / Self Care | Payer: Commercial Managed Care - PPO | Source: Ambulatory Visit

## 2013-04-16 DIAGNOSIS — Z1231 Encounter for screening mammogram for malignant neoplasm of breast: Secondary | ICD-10-CM

## 2013-05-15 ENCOUNTER — Other Ambulatory Visit: Payer: Self-pay | Admitting: Cardiovascular Disease

## 2013-05-19 ENCOUNTER — Ambulatory Visit (INDEPENDENT_AMBULATORY_CARE_PROVIDER_SITE_OTHER): Payer: Commercial Managed Care - PPO | Admitting: *Deleted

## 2013-05-19 DIAGNOSIS — I359 Nonrheumatic aortic valve disorder, unspecified: Secondary | ICD-10-CM

## 2013-05-19 DIAGNOSIS — Z952 Presence of prosthetic heart valve: Secondary | ICD-10-CM

## 2013-05-19 DIAGNOSIS — Z954 Presence of other heart-valve replacement: Secondary | ICD-10-CM

## 2013-05-19 DIAGNOSIS — Z7901 Long term (current) use of anticoagulants: Secondary | ICD-10-CM

## 2013-06-10 ENCOUNTER — Other Ambulatory Visit: Payer: Self-pay | Admitting: Cardiovascular Disease

## 2013-06-16 ENCOUNTER — Ambulatory Visit (INDEPENDENT_AMBULATORY_CARE_PROVIDER_SITE_OTHER): Payer: Commercial Managed Care - PPO | Admitting: *Deleted

## 2013-06-16 DIAGNOSIS — Z7901 Long term (current) use of anticoagulants: Secondary | ICD-10-CM

## 2013-06-16 DIAGNOSIS — Z954 Presence of other heart-valve replacement: Secondary | ICD-10-CM

## 2013-06-16 DIAGNOSIS — I359 Nonrheumatic aortic valve disorder, unspecified: Secondary | ICD-10-CM

## 2013-06-16 DIAGNOSIS — Z952 Presence of prosthetic heart valve: Secondary | ICD-10-CM

## 2013-06-16 LAB — POCT INR: INR: 2.1

## 2013-06-17 ENCOUNTER — Other Ambulatory Visit: Payer: Self-pay

## 2013-07-13 ENCOUNTER — Ambulatory Visit: Payer: Commercial Managed Care - PPO | Admitting: Endocrinology

## 2013-07-14 ENCOUNTER — Ambulatory Visit (INDEPENDENT_AMBULATORY_CARE_PROVIDER_SITE_OTHER): Payer: Commercial Managed Care - PPO | Admitting: Cardiovascular Disease

## 2013-07-14 ENCOUNTER — Encounter: Payer: Self-pay | Admitting: Cardiovascular Disease

## 2013-07-14 ENCOUNTER — Ambulatory Visit (INDEPENDENT_AMBULATORY_CARE_PROVIDER_SITE_OTHER): Payer: Commercial Managed Care - PPO | Admitting: *Deleted

## 2013-07-14 VITALS — BP 120/72 | HR 75 | Ht 62.0 in

## 2013-07-14 DIAGNOSIS — Z954 Presence of other heart-valve replacement: Secondary | ICD-10-CM

## 2013-07-14 DIAGNOSIS — Z952 Presence of prosthetic heart valve: Secondary | ICD-10-CM

## 2013-07-14 DIAGNOSIS — Z7901 Long term (current) use of anticoagulants: Secondary | ICD-10-CM

## 2013-07-14 DIAGNOSIS — E785 Hyperlipidemia, unspecified: Secondary | ICD-10-CM

## 2013-07-14 DIAGNOSIS — I359 Nonrheumatic aortic valve disorder, unspecified: Secondary | ICD-10-CM

## 2013-07-14 LAB — POCT INR: INR: 1.9

## 2013-07-14 MED ORDER — METOPROLOL TARTRATE 25 MG PO TABS: 25.0000 mg | ORAL_TABLET | Freq: Two times a day (BID) | ORAL | Status: DC

## 2013-07-14 NOTE — Progress Notes (Signed)
HPI:   57 year old woman presenting for followup evaluation. The patient has a history of severe bicuspid valve aortic stenosis and she underwent mechanical aortic valve replacement in 2012 with a 19 mm mechanical bileaflet valve.  The patient complains of palpitations. She has had skipped heartbeats and palpitations at nighttime when she is resting. She had an episode when she was walking into work last week where she felt consecutive palpitations and became lightheaded. She had to sit down. That's the only episode with any associated symptoms. Otherwise she is doing well. She denies chest pain or pressure, shortness of breath, or leg swelling, or syncope.  The patient avoids caffeine. She is exercising with regular walking.   Outpatient Encounter Prescriptions as of 07/14/2013  Medication Sig  . aspirin 81 MG tablet Take 81 mg by mouth daily.  . CRESTOR 20 MG tablet TAKE 1 TABLET BY MOUTH ONCE A DAY  . metFORMIN (GLUCOPHAGE) 850 MG tablet Take 1 tablet (850 mg total) by mouth 2 (two) times daily with a meal.  . metoprolol tartrate (LOPRESSOR) 25 MG tablet Take one-half tablet by mouth twice daily  . warfarin (COUMADIN) 5 MG tablet Take 1 tablet (5 mg total) by mouth as directed.  . [DISCONTINUED] azithromycin (ZITHROMAX Z-PAK) 250 MG tablet Use as directed    Allergies  Allergen Reactions  . Atorvastatin     REACTION: myalgias Lipitor  . Codeine     REACTION: N/V  . Morphine And Related Nausea And Vomiting  . Niacin     REACTION: flushing  . Percocet [Oxycodone-Acetaminophen] Other (See Comments)    "blisters in mouth took in 1995"    Past Medical History  Diagnosis Date  . Hyperlipidemia   . Multinodular thyroid   . GERD (gastroesophageal reflux disease)   . Hx of hysterectomy   . Palpitation   . Aortic stenosis   . Heart murmur     aortic stenosis - dr. Excell Seltzer is her cardiologist  . Angina     chest tightness and pressure due to aortic stenosis  . Shortness of  breath     associated with as  . Diabetes mellitus     newly diagnosed in july, 2012  . Headache(784.0)     migraines when younger  . PONV (postoperative nausea and vomiting)     ROS: Positive for epistaxis, otherwise negative except as per HPI  BP 120/72  Pulse 75  Ht 5\' 2"  (1.575 m)  PHYSICAL EXAM: Pt is alert and oriented, NAD HEENT: normal Neck: JVP - normal, carotids 2+= without bruits Lungs: CTA bilaterally CV: RRR with a grade 2/6 systolic ejection murmur best heard at the right upper sternal border Abd: soft, NT, Positive BS, no hepatomegaly Ext: no C/C/E, distal pulses intact and equal Skin: warm/dry no rash  EKG:  Normal sinus rhythm 75 beats per minute, occasional PVC.  2-D echocardiogram 01/13/2013: Left ventricle: The cavity size was normal. Wall thickness was normal. Systolic function was normal. The estimated ejection fraction was in the range of 60% to 65%. Wall motion was normal; there were no regional wall motion abnormalities. Doppler parameters are consistent with abnormal left ventricular relaxation (grade 1 diastolic dysfunction).  ------------------------------------------------------------ Aortic valve: Poorly visualized. A bioprosthesis was present. The valve leaflets are not well seen. The noncoronary cusp appears to be thickened in some views. Doppler: There was moderate to severe stenosis. No regurgitation. VTI ratio of LVOT to aortic valve: 0.29. Valve area: 0.66cm^2(VTI). Indexed valve area: 0.35cm^2/m^2 (VTI). Peak  velocity ratio of LVOT to aortic valve: 0.29. Valve area: 0.66cm^2 (Vmax). Indexed valve area: 0.35cm^2/m^2 (Vmax). Mean gradient: 37mm Hg (S). Peak gradient: 61mm Hg (S).  ------------------------------------------------------------ Aorta: The aorta was normal, not dilated, and non-diseased.  ------------------------------------------------------------ Mitral valve: Structurally normal valve. Leaflet separation was  normal. Doppler: Transvalvular velocity was within the normal range. There was no evidence for stenosis. No regurgitation. Peak gradient: 4mm Hg (D).  ------------------------------------------------------------ Left atrium: The atrium was normal in size.  ------------------------------------------------------------ Right ventricle: The cavity size was normal. Wall thickness was normal. Systolic function was normal.  ------------------------------------------------------------ Pulmonic valve: Poorly visualized. Doppler: No significant regurgitation.  ------------------------------------------------------------ Tricuspid valve: Structurally normal valve. Leaflet separation was normal. Doppler: Mild regurgitation.  ------------------------------------------------------------ Right atrium: The atrium was normal in size.  ------------------------------------------------------------ Pericardium: The pericardium was normal in appearance.  ------------------------------------------------------------ Post procedure conclusions Ascending Aorta:  - The aorta was normal, not dilated, and non-diseased.  ------------------------------------------------------------  2D measurements Normal Doppler measurements Normal Left ventricle Main pulmonary LVID ED, 38.3 mm 43-52 artery chord, Pressure, 31 mm Hg =30 PLAX S LVID ES, 26.2 mm 23-38 Left ventricle chord, Ea, lat 7.57 cm/s ------ PLAX ann, tiss FS, chord, 32 % >29 DP PLAX E/Ea, lat 12.6 ------ LVPW, ED 8.19 mm ------ ann, tiss 3 IVS/LVPW 1.07 <1.3 DP ratio, ED Ea, med 7.46 cm/s ------ Ventricular septum ann, tiss IVS, ED 8.8 mm ------ DP LVOT E/Ea, med 12.8 ------ Diam, S 17 mm ------ ann, tiss 2 Area 2.27 cm^2 ------ DP Diam 17 mm ------ LVOT Aorta Peak vel, 114 cm/s ------ Root diam, 26 mm ------ S ED VTI, S 26.5 cm ------ AAo AP 32 mm ------ Peak 5 mm Hg ------ diam, S gradient, Left atrium S AP dim 37 mm ------ Stroke  vol 60.1 ml ------ AP dim 1.98 cm/m^2 <2.2 Stroke 32.2 ml/m^2 ------ index index Aortic valve Peak vel, 392 cm/s ------ S Mean vel, 291 cm/s ------ S VTI, S 91.2 cm ------ Mean 37 mm Hg ------ gradient, S Peak 61 mm Hg ------ gradient, S VTI ratio 0.29 ------ LVOT/AV Area, VTI 0.66 cm^2 ------ Area index 0.35 cm^2/m ------ (VTI) ^2 Peak vel 0.29 ------ ratio, LVOT/AV Area, Vmax 0.66 cm^2 ------ Area index 0.35 cm^2/m ------ (Vmax) ^2 Mitral valve Peak E vel 95.6 cm/s ------ Peak A vel 122 cm/s ------ Decelerati 225 ms 150-23 on time 0 Peak 4 mm Hg ------ gradient, D Peak E/A 0.8 ------ ratio Tricuspid valve Regurg 255 cm/s ------ peak vel Peak RV-RA 26 mm Hg ------ gradient, S Systemic veins Estimated 5 mm Hg ------ CVP Right ventricle Pressure, 31 mm Hg <30 S Sa vel, 12.2 cm/s ------ lat ann, tiss DP  ASSESSMENT AND PLAN: 1. Aortic stenosis status post mechanical aortic valve replacement. The patient has increased gradients across her prosthetic valve, likely secondary to small valve size. This is been stable on serial imaging. She will have an echocardiogram in 6 months for her next office visit. She is tolerating warfarin with recent epistaxis noted but not associated with a supratherapeutic INR.  2. Heart palpitations. Will increase metoprolol to 25 mg twice daily. If persistent symptoms consider Holter monitoring. Her symptoms were reproduced today while I was examining her and they were associated with single PVCs.  3. Hyperlipidemia. The patient remains on Crestor.  Tonny Bollman 07/14/2013 4:21 PM

## 2013-07-14 NOTE — Patient Instructions (Signed)
Your physician has recommended you make the following change in your medication: INCREASE Metoprolol Tartrate to 25mg  take one by mouth twice a day  Your physician wants you to follow-up in: 6 MONTHS with Dr Excell Seltzer.  You will receive a reminder letter in the mail two months in advance. If you don't receive a letter, please call our office to schedule the follow-up appointment.  Your physician has requested that you have an echocardiogram in 6 MONTHS. Echocardiography is a painless test that uses sound waves to create images of your heart. It provides your doctor with information about the size and shape of your heart and how well your heart's chambers and valves are working. This procedure takes approximately one hour. There are no restrictions for this procedure.

## 2013-07-18 ENCOUNTER — Other Ambulatory Visit: Payer: Self-pay | Admitting: Cardiovascular Disease

## 2013-07-19 NOTE — Telephone Encounter (Signed)
Lauren, is this ok to fill? Thanks, MI

## 2013-07-20 NOTE — Telephone Encounter (Signed)
Please refill for pt.  This is for SBE due to valve.

## 2013-07-25 ENCOUNTER — Ambulatory Visit (INDEPENDENT_AMBULATORY_CARE_PROVIDER_SITE_OTHER): Payer: Commercial Managed Care - PPO | Admitting: Family Medicine

## 2013-07-25 VITALS — BP 110/62 | HR 71 | Temp 98.1°F | Resp 24 | Ht 61.89 in | Wt 174.0 lb

## 2013-07-25 DIAGNOSIS — Z954 Presence of other heart-valve replacement: Secondary | ICD-10-CM

## 2013-07-25 DIAGNOSIS — R319 Hematuria, unspecified: Secondary | ICD-10-CM

## 2013-07-25 DIAGNOSIS — J340 Abscess, furuncle and carbuncle of nose: Secondary | ICD-10-CM

## 2013-07-25 DIAGNOSIS — Z7901 Long term (current) use of anticoagulants: Secondary | ICD-10-CM

## 2013-07-25 DIAGNOSIS — R040 Epistaxis: Secondary | ICD-10-CM

## 2013-07-25 DIAGNOSIS — J019 Acute sinusitis, unspecified: Secondary | ICD-10-CM

## 2013-07-25 DIAGNOSIS — Z952 Presence of prosthetic heart valve: Secondary | ICD-10-CM

## 2013-07-25 DIAGNOSIS — J3489 Other specified disorders of nose and nasal sinuses: Secondary | ICD-10-CM

## 2013-07-25 LAB — POCT UA - MICROSCOPIC ONLY
Bacteria, U Microscopic: NEGATIVE
Crystals, Ur, HPF, POC: NEGATIVE
Mucus, UA: NEGATIVE
RBC, urine, microscopic: NEGATIVE

## 2013-07-25 LAB — POCT URINALYSIS DIPSTICK
Bilirubin, UA: NEGATIVE
Glucose, UA: NEGATIVE
Ketones, UA: NEGATIVE
Leukocytes, UA: NEGATIVE
Spec Grav, UA: 1.01

## 2013-07-25 LAB — POCT CBC
Granulocyte percent: 55.4 %G (ref 37–80)
HCT, POC: 44.3 % (ref 37.7–47.9)
MCV: 93.2 fL (ref 80–97)
MID (cbc): 0.5 (ref 0–0.9)
MPV: 10.5 fL (ref 0–99.8)
POC Granulocyte: 4.7 (ref 2–6.9)
Platelet Count, POC: 290 10*3/uL (ref 142–424)
RBC: 4.75 M/uL (ref 4.04–5.48)

## 2013-07-25 LAB — PROTIME-INR
INR: 1.9 — ABNORMAL HIGH (ref <1.50)
Prothrombin Time: 21.4 seconds — ABNORMAL HIGH (ref 11.6–15.2)

## 2013-07-25 MED ORDER — AMOXICILLIN 500 MG PO CAPS: 1000.0000 mg | ORAL_CAPSULE | Freq: Two times a day (BID) | ORAL | Status: DC

## 2013-07-25 NOTE — Progress Notes (Signed)
Subjective:  This chart was scribed for Amanda Simmer, MD by Carl Best, Medical Scribe. This patient was seen in Room 11 and the patient's care was started at 9:29 AM.   Patient ID: [name], female    DOB: [birth year], 57 y.o.   MRN: [id number]  HPI HPI Comments: [name] is a 57 y.o. female who presents to the [facility] complaining of hematuria and epistaxis.  She states that the epistaxis started two and a half weeks ago and always occurs from her right nare.  She states that she normally has 2-3 episodes of epistaxis a day lasting 15 minutes at a time with the worst episode occuring in the morning.  She states that her right nare will drip blood and when she blows her nose she notices thick mucous.  She states that the bleeding will stop after she blows her nose.  She states that the epistaxis was very severe this morning.  She states that she has used saline nasal spray, nasal gel, and humidifiers both at work and at home with no change to the severity or frequency of her symptoms.   She states that her hematuria started this morning.  She states that her urine was very dark in color. +urgency for past several days; no dysuria or frequency; no n/v; no flank pain.   She denies fever, vaginal bleeding, cough, diarrhea, emesis, chills, or sweats as associated symptoms.  She lists scratchy throat, headache, right sided otalgia, sinus pressure, and dental pressure as associated symptoms.   The patient is currently on chronic Coumadin therapy for aortic valve replacement.  Her INR from July 14, 2013 was 1.9.  She denies being placed on Amoxicillin when she got her INR results; Amoxicillin rx is for dental procedures.     Past Medical History  Diagnosis Date  . Hyperlipidemia   . Multinodular thyroid   . GERD (gastroesophageal reflux disease)   . Hx of hysterectomy   . Palpitation   . Aortic stenosis   . Heart murmur     aortic stenosis - dr. Excell Seltzer is  her cardiologist  . Angina     chest tightness and pressure due to aortic stenosis  . Shortness of breath     associated with as  . Diabetes mellitus     newly diagnosed in july, 2012  . Headache(784.0)     migraines when younger  . PONV (postoperative nausea and vomiting)    Past Surgical History  Procedure Laterality Date  . Cesarean section    . Bunionectomy    . Partial hysterectomy    . Abdominal hysterectomy    . Aortic valve replacement  06/25/2011    Procedure: AORTIC VALVE REPLACEMENT (AVR);  Surgeon: Kathlee Nations Suann Larry, MD;  Location: [facility];  Service: Open Heart Surgery;  Laterality: N/A;  . Cardiac valve replacement      Aortic Valve   06/25/2011  . Tee without cardioversion N/A 09/18/2012    Procedure: TRANSESOPHAGEAL ECHOCARDIOGRAM (TEE);  Surgeon: Pricilla Riffle, MD;  Location: [facility] ENDOSCOPY;  Service: Cardiovascular;  Laterality: N/A;   Family History  Problem Relation Age of Onset  . Hypertension Other   . Coronary artery disease Father   . COPD Father   . Diabetes Mother   . Cancer Mother 74    bile duct cancer   History   Social History  . Marital Status: Married    Spouse Name: N/A  Number of Children: N/A  . Years of Education: N/A   Occupational History  . Not on file.   Social History Main Topics  . Smoking status: Former Smoker -- 1.00 packs/day for 15 years    Types: Cigarettes    Quit date: 08/12/1994  . Smokeless tobacco: Never Used  . Alcohol Use: No  . Drug Use: No  . Sexual Activity: Not on file   Other Topics Concern  . Not on file   Social History Narrative   Married - one daughter 58 y/o   Occupation: Employed as Production manager for [employer]   Allergies  Allergen Reactions  . Atorvastatin     REACTION: myalgias Lipitor  . Codeine     REACTION: N/V  . Morphine And Related Nausea And Vomiting  . Niacin     REACTION: flushing  . Percocet [Oxycodone-Acetaminophen] Other (See Comments)    "blisters in mouth took in 1995"      Review of Systems  Constitutional: Negative for fever, chills and diaphoresis.  HENT: Positive for dental problem (right sided dental pain), ear pain (right ear), nosebleeds, rhinorrhea and sinus pressure.   Respiratory: Negative for cough.   Gastrointestinal: Negative for nausea, vomiting and diarrhea.  Genitourinary: Positive for urgency and hematuria. Negative for vaginal bleeding.  Neurological: Positive for headaches.  All other systems reviewed and are negative.     Objective:  Physical Exam  Nursing note and vitals reviewed. Constitutional: She is oriented to person, place, and time. She appears well-developed and well-nourished. No distress.  HENT:  Head: Normocephalic and atraumatic.  Right Ear: External ear normal.  Left Ear: External ear normal.  Nose: Right sinus exhibits maxillary sinus tenderness and frontal sinus tenderness. Left sinus exhibits maxillary sinus tenderness and frontal sinus tenderness.  5 mm diameter ulcerated lesion on the septum of the right nare. No active bleeding  Eyes: Conjunctivae and EOM are normal. Pupils are equal, round, and reactive to light.  Neck: Neck supple.  Cardiovascular: Normal rate.   Pulmonary/Chest: Effort normal.  Neurological: She is alert and oriented to person, place, and time. No cranial nerve deficit.  Psychiatric: She has a normal mood and affect. Her behavior is normal.   PROCEDURE NOTE:   Verbal consent obtained.   Silver Nitrate stick applied to right nasal septum at area of ulceration.   Patient tolerated procedure well.    Results for orders placed in visit on 07/25/13  POCT CBC      Result Value Range   WBC 8.4  4.6 - 10.2 K/uL   Lymph, poc 3.2  0.6 - 3.4   POC LYMPH PERCENT 38.6  10 - 50 %L   MID (cbc) 0.5  0 - 0.9   POC MID % 6.0  0 - 12 %M   POC Granulocyte 4.7  2 - 6.9   Granulocyte percent 55.4  37 - 80 %G   RBC 4.75  4.04 - 5.48 M/uL   Hemoglobin 13.7  12.2 - 16.2 g/dL   HCT, POC 16.1  09.6  - 47.9 %   MCV 93.2  80 - 97 fL   MCH, POC 28.8  27 - 31.2 pg   MCHC 30.9 (*) 31.8 - 35.4 g/dL   RDW, POC 04.5     Platelet Count, POC 290  142 - 424 K/uL   MPV 10.5  0 - 99.8 fL  POCT UA - MICROSCOPIC ONLY      Result Value Range   WBC, Ur, HPF,  POC 0-1     RBC, urine, microscopic neg     Bacteria, U Microscopic neg     Mucus, UA neg     Epithelial cells, urine per micros neg     Crystals, Ur, HPF, POC neg     Casts, Ur, LPF, POC neg     Yeast, UA neg    POCT URINALYSIS DIPSTICK      Result Value Range   Color, UA yellow     Clarity, UA clear     Glucose, UA neg     Bilirubin, UA neg     Ketones, UA neg     Spec Grav, UA 1.010     Blood, UA neg     pH, UA 5.5     Protein, UA neg     Urobilinogen, UA 0.2     Nitrite, UA neg     Leukocytes, UA Negative      Assessment & Plan:  Epistaxis - Plan: POCT CBC, POCT UA - Microscopic Only, POCT urinalysis dipstick, Protime-INR  Nasal septal ulcer - Plan: POCT CBC, POCT UA - Microscopic Only, POCT urinalysis dipstick, Protime-INR  Hematuria - Plan: POCT CBC, POCT UA - Microscopic Only, POCT urinalysis dipstick, Protime-INR  Acute sinus infection - Plan: POCT CBC, POCT UA - Microscopic Only, POCT urinalysis dipstick, Protime-INR  S/P AVR (aortic valve replacement) - Plan: POCT CBC, POCT UA - Microscopic Only, POCT urinalysis dipstick, Protime-INR  Encounter for long-term (current) use of anticoagulants - Plan: POCT CBC, POCT UA - Microscopic Only, POCT urinalysis dipstick, Protime-INR  1.  Epistaxis:  New.  Onset two weeks ago with visualized septal ulcer; s/p silver nitrate cautery; pt tolerated well.  If persistent epistaxis, to call office in 2 weeks for ENT referral. 2.  Nasal septal ulcer R:  New.  S/p silver nitrate cautery.  If epistaxis persists, refer to ENT. 3.  Hematuria:  New.  By report; u/a negative; no microscopic hematuria; send urine culture due to frequent urgency. 4.  Acute sinusitis maxillary R:  New. Rx for  Amoxicillin; continue nasal saline. 5.  S/p AVR: stable; maintained on chronic Coumadin. 6.  Chronic coumadin therapy: stable; obtain INR with recent bleeding.  Meds ordered this encounter  Medications  . amoxicillin (AMOXIL) 500 MG capsule    Sig: Take 2 capsules (1,000 mg total) by mouth 2 (two) times daily.    Dispense:  40 capsule    Refill:  0    I personally performed the services described in this documentation, which was scribed in my presence. The recorded information has been reviewed and is accurate.

## 2013-07-25 NOTE — Patient Instructions (Signed)
1.  CONTINUE NASAL SALINE TWO TIMES DAILY. 2.  CONTINUE NASAL VASELINE/OINTMENT TO R MEDIAL NOSE AT BEDTIME. 3. CONTINUE HUMIDIFIERS DURING WINTER DRY MONTHS. 4. IF NOSE BLEEDS PERSIST, RECOMMEND EVALUATION BY ENT SPECIALIST; PLEASE CALL IN 1-2 WEEKS IF NOSE BLEEDS PERSIST AND I WILL BE HAPPY TO REFER TO ENT SPECIALIST.

## 2013-08-08 ENCOUNTER — Encounter: Payer: Self-pay | Admitting: Family Medicine

## 2013-08-11 ENCOUNTER — Ambulatory Visit (INDEPENDENT_AMBULATORY_CARE_PROVIDER_SITE_OTHER): Payer: Commercial Managed Care - PPO | Admitting: *Deleted

## 2013-08-11 DIAGNOSIS — Z7901 Long term (current) use of anticoagulants: Secondary | ICD-10-CM

## 2013-08-11 DIAGNOSIS — I359 Nonrheumatic aortic valve disorder, unspecified: Secondary | ICD-10-CM

## 2013-08-11 DIAGNOSIS — Z952 Presence of prosthetic heart valve: Secondary | ICD-10-CM

## 2013-08-11 DIAGNOSIS — Z954 Presence of other heart-valve replacement: Secondary | ICD-10-CM

## 2013-08-11 LAB — POCT INR: INR: 1.7

## 2013-08-24 ENCOUNTER — Encounter (HOSPITAL_COMMUNITY): Payer: Self-pay | Admitting: Cardiovascular Disease

## 2013-08-25 ENCOUNTER — Ambulatory Visit (INDEPENDENT_AMBULATORY_CARE_PROVIDER_SITE_OTHER): Payer: Commercial Managed Care - PPO | Admitting: *Deleted

## 2013-08-25 DIAGNOSIS — Z954 Presence of other heart-valve replacement: Secondary | ICD-10-CM

## 2013-08-25 DIAGNOSIS — Z952 Presence of prosthetic heart valve: Secondary | ICD-10-CM

## 2013-08-25 DIAGNOSIS — I359 Nonrheumatic aortic valve disorder, unspecified: Secondary | ICD-10-CM

## 2013-08-25 DIAGNOSIS — Z7901 Long term (current) use of anticoagulants: Secondary | ICD-10-CM

## 2013-08-25 LAB — POCT INR: INR: 2.3

## 2013-09-08 ENCOUNTER — Ambulatory Visit (INDEPENDENT_AMBULATORY_CARE_PROVIDER_SITE_OTHER): Payer: Commercial Managed Care - PPO | Admitting: *Deleted

## 2013-09-08 DIAGNOSIS — Z7901 Long term (current) use of anticoagulants: Secondary | ICD-10-CM

## 2013-09-08 DIAGNOSIS — Z952 Presence of prosthetic heart valve: Secondary | ICD-10-CM

## 2013-09-08 DIAGNOSIS — Z5181 Encounter for therapeutic drug level monitoring: Secondary | ICD-10-CM

## 2013-09-08 DIAGNOSIS — Z954 Presence of other heart-valve replacement: Secondary | ICD-10-CM

## 2013-09-08 DIAGNOSIS — I359 Nonrheumatic aortic valve disorder, unspecified: Secondary | ICD-10-CM

## 2013-09-08 LAB — POCT INR: INR: 3.3

## 2013-09-23 ENCOUNTER — Ambulatory Visit (INDEPENDENT_AMBULATORY_CARE_PROVIDER_SITE_OTHER): Payer: Commercial Managed Care - PPO | Admitting: *Deleted

## 2013-09-23 DIAGNOSIS — Z952 Presence of prosthetic heart valve: Secondary | ICD-10-CM

## 2013-09-23 DIAGNOSIS — Z954 Presence of other heart-valve replacement: Secondary | ICD-10-CM

## 2013-09-23 DIAGNOSIS — I359 Nonrheumatic aortic valve disorder, unspecified: Secondary | ICD-10-CM

## 2013-09-23 DIAGNOSIS — Z7901 Long term (current) use of anticoagulants: Secondary | ICD-10-CM

## 2013-09-23 DIAGNOSIS — Z5181 Encounter for therapeutic drug level monitoring: Secondary | ICD-10-CM

## 2013-09-23 LAB — POCT INR: INR: 2.6

## 2013-10-05 ENCOUNTER — Other Ambulatory Visit: Payer: Self-pay | Admitting: Cardiovascular Disease

## 2013-10-21 ENCOUNTER — Ambulatory Visit (INDEPENDENT_AMBULATORY_CARE_PROVIDER_SITE_OTHER): Payer: Commercial Managed Care - PPO | Admitting: Pharmacist Clinician (PhC)/ Clinical Pharmacy Specialist

## 2013-10-21 DIAGNOSIS — Z954 Presence of other heart-valve replacement: Secondary | ICD-10-CM

## 2013-10-21 DIAGNOSIS — Z5181 Encounter for therapeutic drug level monitoring: Secondary | ICD-10-CM

## 2013-10-21 DIAGNOSIS — Z952 Presence of prosthetic heart valve: Secondary | ICD-10-CM

## 2013-10-21 DIAGNOSIS — Z7901 Long term (current) use of anticoagulants: Secondary | ICD-10-CM

## 2013-10-21 DIAGNOSIS — I359 Nonrheumatic aortic valve disorder, unspecified: Secondary | ICD-10-CM

## 2013-10-21 LAB — POCT INR: INR: 3

## 2013-11-18 ENCOUNTER — Ambulatory Visit (INDEPENDENT_AMBULATORY_CARE_PROVIDER_SITE_OTHER): Payer: Commercial Managed Care - PPO

## 2013-11-18 DIAGNOSIS — Z954 Presence of other heart-valve replacement: Secondary | ICD-10-CM

## 2013-11-18 DIAGNOSIS — I359 Nonrheumatic aortic valve disorder, unspecified: Secondary | ICD-10-CM

## 2013-11-18 DIAGNOSIS — Z5181 Encounter for therapeutic drug level monitoring: Secondary | ICD-10-CM

## 2013-11-18 DIAGNOSIS — Z7901 Long term (current) use of anticoagulants: Secondary | ICD-10-CM

## 2013-11-18 DIAGNOSIS — Z952 Presence of prosthetic heart valve: Secondary | ICD-10-CM

## 2013-11-18 LAB — POCT INR: INR: 2.6

## 2013-12-30 ENCOUNTER — Ambulatory Visit (INDEPENDENT_AMBULATORY_CARE_PROVIDER_SITE_OTHER): Payer: Commercial Managed Care - PPO | Admitting: *Deleted

## 2013-12-30 DIAGNOSIS — I359 Nonrheumatic aortic valve disorder, unspecified: Secondary | ICD-10-CM

## 2013-12-30 DIAGNOSIS — Z952 Presence of prosthetic heart valve: Secondary | ICD-10-CM

## 2013-12-30 DIAGNOSIS — Z954 Presence of other heart-valve replacement: Secondary | ICD-10-CM

## 2013-12-30 DIAGNOSIS — Z5181 Encounter for therapeutic drug level monitoring: Secondary | ICD-10-CM

## 2013-12-30 DIAGNOSIS — Z7901 Long term (current) use of anticoagulants: Secondary | ICD-10-CM

## 2013-12-30 LAB — POCT INR: INR: 3

## 2014-01-26 ENCOUNTER — Encounter: Payer: Self-pay | Admitting: Cardiovascular Disease

## 2014-01-26 ENCOUNTER — Ambulatory Visit (INDEPENDENT_AMBULATORY_CARE_PROVIDER_SITE_OTHER): Payer: Commercial Managed Care - PPO | Admitting: Cardiovascular Disease

## 2014-01-26 ENCOUNTER — Ambulatory Visit (HOSPITAL_COMMUNITY): Payer: Commercial Managed Care - PPO | Attending: Cardiovascular Disease | Admitting: Radiology

## 2014-01-26 VITALS — BP 118/73 | HR 58 | Ht 61.89 in | Wt 179.8 lb

## 2014-01-26 DIAGNOSIS — Q231 Congenital insufficiency of aortic valve: Secondary | ICD-10-CM

## 2014-01-26 DIAGNOSIS — E785 Hyperlipidemia, unspecified: Secondary | ICD-10-CM

## 2014-01-26 DIAGNOSIS — Z954 Presence of other heart-valve replacement: Secondary | ICD-10-CM

## 2014-01-26 DIAGNOSIS — I359 Nonrheumatic aortic valve disorder, unspecified: Secondary | ICD-10-CM

## 2014-01-26 DIAGNOSIS — Z952 Presence of prosthetic heart valve: Secondary | ICD-10-CM

## 2014-01-26 NOTE — Patient Instructions (Signed)
Your physician wants you to follow-up in: 1 YEAR with Dr Cooper.  You will receive a reminder letter in the mail two months in advance. If you don't receive a letter, please call our office to schedule the follow-up appointment.  Your physician has requested that you have an echocardiogram in 1 YEAR. Echocardiography is a painless test that uses sound waves to create images of your heart. It provides your doctor with information about the size and shape of your heart and how well your heart's chambers and valves are working. This procedure takes approximately one hour. There are no restrictions for this procedure.  Your physician recommends that you continue on your current medications as directed. Please refer to the Current Medication list given to you today.   

## 2014-01-26 NOTE — Progress Notes (Signed)
Echocardiogram performed.  

## 2014-01-27 NOTE — Progress Notes (Signed)
HPI:   58 year old woman presenting for followup evaluation. The patient has a history of severe bicuspid valve aortic stenosis and she underwent mechanical aortic valve replacement in 2012 with a 19 mm mechanical bileaflet valve. Postoperatively she has been noted to have elevated transvalvular gradients. She's undergone transesophageal echo demonstrating no clear evidence of leaflet dysfunction. Mechanism has been felt to be related to her small aortic prosthesis.   She is stable from a symptomatic perspective. She has mild exertional dyspnea with no change. The symptoms that she experienced prior to valve replacement, which include exertional chest discomfort and lightheadedness, are no longer present. She does complain of heat intolerance. She denies leg swelling, orthopnea, or PND. She has experienced no bleeding problems on long-term warfarin.  Outpatient Encounter Prescriptions as of 01/26/2014  Medication Sig  . amoxicillin (AMOXIL) 500 MG capsule Take 4 capsules by mouth 1 hour prior to appointment  . aspirin 81 MG tablet Take 81 mg by mouth daily.  . CRESTOR 20 MG tablet TAKE 1 TABLET BY MOUTH ONCE A DAY  . metFORMIN (GLUCOPHAGE) 850 MG tablet Take 1 tablet (850 mg total) by mouth 2 (two) times daily with a meal.  . metoprolol tartrate (LOPRESSOR) 25 MG tablet Take 1 tablet (25 mg total) by mouth 2 (two) times daily.  Marland Kitchen warfarin (COUMADIN) 5 MG tablet TAKE ONE TABLET BY MOUTH ONE TIME DAILY AS DIRECTED  . [DISCONTINUED] amoxicillin (AMOXIL) 500 MG capsule Take 2 capsules (1,000 mg total) by mouth 2 (two) times daily.    Allergies  Allergen Reactions  . Atorvastatin     REACTION: myalgias Lipitor  . Codeine     REACTION: N/V  . Morphine And Related Nausea And Vomiting  . Niacin     REACTION: flushing  . Percocet [Oxycodone-Acetaminophen] Other (See Comments)    "blisters in mouth took in 1995"    Past Medical History  Diagnosis Date  . Hyperlipidemia   . Multinodular  thyroid   . GERD (gastroesophageal reflux disease)   . Hx of hysterectomy   . Palpitation   . Aortic stenosis   . Heart murmur     aortic stenosis - dr. Burt Knack is her cardiologist  . Angina     chest tightness and pressure due to aortic stenosis  . Shortness of breath     associated with as  . Diabetes mellitus     newly diagnosed in july, 2012  . Headache(784.0)     migraines when younger  . PONV (postoperative nausea and vomiting)     ROS: Negative except as outlined above  BP 118/73  Pulse 58  Ht 5' 1.89" (1.572 m)  Wt 81.534 kg (179 lb 12 oz)  BMI 32.99 kg/m2  PHYSICAL EXAM: Pt is alert and oriented, NAD HEENT: normal Neck: JVP - normal, carotids 2+= without bruits Lungs: CTA bilaterally CV: RRR with a grade 2/6 systolic ejection murmur best heard at the right upper sternal border Abd: soft, NT, Positive BS, no hepatomegaly Ext: no C/C/E, distal pulses intact and equal Skin: warm/dry no rash  EKG:  Sinus rhythm with occasional PVC, low-voltage QRS.  2-D echocardiogram: Study Conclusions  - Left ventricle: The cavity size was normal. Systolic function was normal. The estimated ejection fraction was in the range of 55% to 60%. Wall motion was normal; there were no regional wall motion abnormalities. Doppler parameters are consistent with abnormal left ventricular relaxation (grade 1 diastolic dysfunction). - Aortic valve: A bioprosthesis was present with functional  aortic stenosis of moderate degree. There was trivial regurgitation. Peak velocity (S): 376 cm/s. Mean gradient (S): 34 mm Hg. - Pulmonary arteries: Systolic pressure was mildly increased. PA peak pressure: 32 mm Hg (S).  Impressions:  - Aortic valve mean gradient is similar to prior echocardiogram.  ASSESSMENT AND PLAN: 1. Aortic stenosis status post mechanical aortic valve replacement. Echo images were reviewed today. There is no significant change in her transaortic valve gradients.  Symptomatically she is stable with New [place] Heart Association functional class II symptoms. Will continue her current medical program and plan a repeat echocardiogram in one year. She understands to follow SBE prophylaxis per current guideline recommendations.   2. Hyperlipidemia. The patient remains on Crestor. Lipids are viewed from July 2014: Lipid Panel     Component Value Date/Time   CHOL 163 03/03/2013 0732   TRIG 238.0* 03/03/2013 0732   HDL 46.20 03/03/2013 0732   CHOLHDL 4 03/03/2013 0732   VLDL 47.6* 03/03/2013 0732   LDLCALC 91 03/18/2011 0743   3. Chronic anticoagulation. She remains on warfarin and low-dose aspirin in the setting of her mechanical aortic valve without bleeding complications.  Sherren Mocha 01/27/2014 12:14 AM

## 2014-02-10 ENCOUNTER — Ambulatory Visit (INDEPENDENT_AMBULATORY_CARE_PROVIDER_SITE_OTHER): Payer: Commercial Managed Care - PPO | Admitting: Pharmacist

## 2014-02-10 DIAGNOSIS — Z5181 Encounter for therapeutic drug level monitoring: Secondary | ICD-10-CM

## 2014-02-10 DIAGNOSIS — Z954 Presence of other heart-valve replacement: Secondary | ICD-10-CM

## 2014-02-10 DIAGNOSIS — Z7901 Long term (current) use of anticoagulants: Secondary | ICD-10-CM

## 2014-02-10 DIAGNOSIS — I359 Nonrheumatic aortic valve disorder, unspecified: Secondary | ICD-10-CM

## 2014-02-10 DIAGNOSIS — Z952 Presence of prosthetic heart valve: Secondary | ICD-10-CM

## 2014-02-10 LAB — POCT INR: INR: 2.8

## 2014-02-10 MED ORDER — WARFARIN SODIUM 5 MG PO TABS: ORAL_TABLET | ORAL | Status: DC

## 2014-03-17 ENCOUNTER — Other Ambulatory Visit: Payer: Self-pay

## 2014-03-17 DIAGNOSIS — Z1231 Encounter for screening mammogram for malignant neoplasm of breast: Secondary | ICD-10-CM

## 2014-03-24 ENCOUNTER — Ambulatory Visit (INDEPENDENT_AMBULATORY_CARE_PROVIDER_SITE_OTHER): Payer: Commercial Managed Care - PPO | Admitting: *Deleted

## 2014-03-24 DIAGNOSIS — I359 Nonrheumatic aortic valve disorder, unspecified: Secondary | ICD-10-CM

## 2014-03-24 DIAGNOSIS — Z954 Presence of other heart-valve replacement: Secondary | ICD-10-CM

## 2014-03-24 DIAGNOSIS — Z5181 Encounter for therapeutic drug level monitoring: Secondary | ICD-10-CM

## 2014-03-24 DIAGNOSIS — Z7901 Long term (current) use of anticoagulants: Secondary | ICD-10-CM

## 2014-03-24 DIAGNOSIS — Z952 Presence of prosthetic heart valve: Secondary | ICD-10-CM

## 2014-03-24 LAB — POCT INR: INR: 4.1

## 2014-04-06 ENCOUNTER — Telehealth: Payer: Self-pay | Admitting: *Deleted

## 2014-04-06 NOTE — Telephone Encounter (Signed)
Diabetic bundle, unable to leave voicemail asking patient to call back

## 2014-04-07 ENCOUNTER — Ambulatory Visit (INDEPENDENT_AMBULATORY_CARE_PROVIDER_SITE_OTHER): Payer: Commercial Managed Care - PPO

## 2014-04-07 DIAGNOSIS — Z952 Presence of prosthetic heart valve: Secondary | ICD-10-CM

## 2014-04-07 DIAGNOSIS — Z7901 Long term (current) use of anticoagulants: Secondary | ICD-10-CM

## 2014-04-07 DIAGNOSIS — Z954 Presence of other heart-valve replacement: Secondary | ICD-10-CM

## 2014-04-07 DIAGNOSIS — Z5181 Encounter for therapeutic drug level monitoring: Secondary | ICD-10-CM

## 2014-04-07 DIAGNOSIS — I359 Nonrheumatic aortic valve disorder, unspecified: Secondary | ICD-10-CM

## 2014-04-07 LAB — POCT INR: INR: 2.9

## 2014-04-26 ENCOUNTER — Ambulatory Visit
Admission: RE | Admit: 2014-04-26 | Discharge: 2014-04-26 | Disposition: A | Discharge status: Home / Self Care | Payer: Commercial Managed Care - PPO | Source: Ambulatory Visit

## 2014-04-26 DIAGNOSIS — Z1231 Encounter for screening mammogram for malignant neoplasm of breast: Secondary | ICD-10-CM

## 2014-04-28 ENCOUNTER — Ambulatory Visit (INDEPENDENT_AMBULATORY_CARE_PROVIDER_SITE_OTHER): Payer: Commercial Managed Care - PPO

## 2014-04-28 DIAGNOSIS — Z7901 Long term (current) use of anticoagulants: Secondary | ICD-10-CM

## 2014-04-28 DIAGNOSIS — I359 Nonrheumatic aortic valve disorder, unspecified: Secondary | ICD-10-CM

## 2014-04-28 DIAGNOSIS — Z954 Presence of other heart-valve replacement: Secondary | ICD-10-CM

## 2014-04-28 DIAGNOSIS — Z5181 Encounter for therapeutic drug level monitoring: Secondary | ICD-10-CM

## 2014-04-28 DIAGNOSIS — Z952 Presence of prosthetic heart valve: Secondary | ICD-10-CM

## 2014-04-28 LAB — POCT INR: INR: 3.3

## 2014-05-19 ENCOUNTER — Ambulatory Visit (INDEPENDENT_AMBULATORY_CARE_PROVIDER_SITE_OTHER): Payer: Commercial Managed Care - PPO | Admitting: Pharmacist

## 2014-05-19 DIAGNOSIS — Z952 Presence of prosthetic heart valve: Secondary | ICD-10-CM

## 2014-05-19 DIAGNOSIS — Z954 Presence of other heart-valve replacement: Secondary | ICD-10-CM

## 2014-05-19 DIAGNOSIS — Z5181 Encounter for therapeutic drug level monitoring: Secondary | ICD-10-CM

## 2014-05-19 DIAGNOSIS — Z7901 Long term (current) use of anticoagulants: Secondary | ICD-10-CM

## 2014-05-19 DIAGNOSIS — I359 Nonrheumatic aortic valve disorder, unspecified: Secondary | ICD-10-CM

## 2014-05-19 LAB — POCT INR: INR: 4

## 2014-06-09 ENCOUNTER — Ambulatory Visit (INDEPENDENT_AMBULATORY_CARE_PROVIDER_SITE_OTHER): Payer: Commercial Managed Care - PPO | Admitting: Pharmacist

## 2014-06-09 DIAGNOSIS — Z952 Presence of prosthetic heart valve: Secondary | ICD-10-CM

## 2014-06-09 DIAGNOSIS — I359 Nonrheumatic aortic valve disorder, unspecified: Secondary | ICD-10-CM

## 2014-06-09 DIAGNOSIS — Z7901 Long term (current) use of anticoagulants: Secondary | ICD-10-CM

## 2014-06-09 DIAGNOSIS — Z954 Presence of other heart-valve replacement: Secondary | ICD-10-CM

## 2014-06-09 DIAGNOSIS — Z5181 Encounter for therapeutic drug level monitoring: Secondary | ICD-10-CM

## 2014-06-09 LAB — POCT INR: INR: 2.1

## 2014-06-11 ENCOUNTER — Other Ambulatory Visit: Payer: Self-pay | Admitting: Cardiovascular Disease

## 2014-06-30 ENCOUNTER — Ambulatory Visit (INDEPENDENT_AMBULATORY_CARE_PROVIDER_SITE_OTHER): Payer: Commercial Managed Care - PPO | Admitting: *Deleted

## 2014-06-30 ENCOUNTER — Encounter (HOSPITAL_COMMUNITY): Payer: Self-pay | Admitting: Cardiovascular Disease

## 2014-06-30 DIAGNOSIS — Z5181 Encounter for therapeutic drug level monitoring: Secondary | ICD-10-CM

## 2014-06-30 DIAGNOSIS — I359 Nonrheumatic aortic valve disorder, unspecified: Secondary | ICD-10-CM

## 2014-06-30 DIAGNOSIS — Z7901 Long term (current) use of anticoagulants: Secondary | ICD-10-CM

## 2014-06-30 DIAGNOSIS — Z952 Presence of prosthetic heart valve: Secondary | ICD-10-CM

## 2014-06-30 DIAGNOSIS — Z954 Presence of other heart-valve replacement: Secondary | ICD-10-CM

## 2014-06-30 LAB — POCT INR: INR: 3.7

## 2014-07-18 ENCOUNTER — Other Ambulatory Visit: Payer: Self-pay | Admitting: Cardiovascular Disease

## 2014-07-19 ENCOUNTER — Ambulatory Visit (INDEPENDENT_AMBULATORY_CARE_PROVIDER_SITE_OTHER): Payer: Commercial Managed Care - PPO | Admitting: Pharmacist

## 2014-07-19 DIAGNOSIS — I359 Nonrheumatic aortic valve disorder, unspecified: Secondary | ICD-10-CM

## 2014-07-19 DIAGNOSIS — Z952 Presence of prosthetic heart valve: Secondary | ICD-10-CM

## 2014-07-19 DIAGNOSIS — Z5181 Encounter for therapeutic drug level monitoring: Secondary | ICD-10-CM

## 2014-07-19 DIAGNOSIS — Z954 Presence of other heart-valve replacement: Secondary | ICD-10-CM

## 2014-07-19 DIAGNOSIS — Z7901 Long term (current) use of anticoagulants: Secondary | ICD-10-CM

## 2014-07-19 LAB — POCT INR: INR: 3.2

## 2014-08-08 ENCOUNTER — Ambulatory Visit (INDEPENDENT_AMBULATORY_CARE_PROVIDER_SITE_OTHER): Payer: Commercial Managed Care - PPO | Admitting: Pharmacist

## 2014-08-08 DIAGNOSIS — Z5181 Encounter for therapeutic drug level monitoring: Secondary | ICD-10-CM

## 2014-08-08 DIAGNOSIS — Z952 Presence of prosthetic heart valve: Secondary | ICD-10-CM

## 2014-08-08 DIAGNOSIS — Z7901 Long term (current) use of anticoagulants: Secondary | ICD-10-CM

## 2014-08-08 DIAGNOSIS — I359 Nonrheumatic aortic valve disorder, unspecified: Secondary | ICD-10-CM

## 2014-08-08 DIAGNOSIS — Z954 Presence of other heart-valve replacement: Secondary | ICD-10-CM

## 2014-08-08 LAB — POCT INR: INR: 2.8

## 2014-09-01 ENCOUNTER — Other Ambulatory Visit: Payer: Self-pay | Admitting: Cardiovascular Disease

## 2014-09-08 ENCOUNTER — Ambulatory Visit (INDEPENDENT_AMBULATORY_CARE_PROVIDER_SITE_OTHER): Payer: Commercial Managed Care - PPO | Admitting: Pharmacist

## 2014-09-08 DIAGNOSIS — I359 Nonrheumatic aortic valve disorder, unspecified: Secondary | ICD-10-CM

## 2014-09-08 DIAGNOSIS — Z952 Presence of prosthetic heart valve: Secondary | ICD-10-CM

## 2014-09-08 DIAGNOSIS — Z7901 Long term (current) use of anticoagulants: Secondary | ICD-10-CM

## 2014-09-08 DIAGNOSIS — Z5181 Encounter for therapeutic drug level monitoring: Secondary | ICD-10-CM

## 2014-09-08 DIAGNOSIS — Z954 Presence of other heart-valve replacement: Secondary | ICD-10-CM

## 2014-09-08 LAB — POCT INR: INR: 3.3

## 2014-09-23 ENCOUNTER — Ambulatory Visit (INDEPENDENT_AMBULATORY_CARE_PROVIDER_SITE_OTHER): Payer: Commercial Managed Care - PPO | Admitting: *Deleted

## 2014-09-23 DIAGNOSIS — Z954 Presence of other heart-valve replacement: Secondary | ICD-10-CM

## 2014-09-23 DIAGNOSIS — Z5181 Encounter for therapeutic drug level monitoring: Secondary | ICD-10-CM

## 2014-09-23 DIAGNOSIS — Z7901 Long term (current) use of anticoagulants: Secondary | ICD-10-CM

## 2014-09-23 DIAGNOSIS — I359 Nonrheumatic aortic valve disorder, unspecified: Secondary | ICD-10-CM

## 2014-09-23 DIAGNOSIS — Z952 Presence of prosthetic heart valve: Secondary | ICD-10-CM

## 2014-09-23 LAB — POCT INR: INR: 2.6

## 2014-09-26 ENCOUNTER — Telehealth: Payer: Self-pay | Admitting: Cardiovascular Disease

## 2014-09-26 NOTE — Telephone Encounter (Signed)
New message     Pt is on coumadin.  Pt was prescribed cefuroxime axetil 500mg  bid for 10 days---URI by the walk in clinic.  Will her coumadin have to be adjusted?

## 2014-09-26 NOTE — Telephone Encounter (Signed)
Returned call to pt advised no interaction with abx rx.  Continue on same dosage of Coumadin, and keep previously scheduled appt.

## 2014-10-13 ENCOUNTER — Other Ambulatory Visit: Payer: Self-pay

## 2014-10-13 ENCOUNTER — Other Ambulatory Visit: Payer: Self-pay | Admitting: Internal Medicine

## 2014-10-13 ENCOUNTER — Ambulatory Visit
Admission: RE | Admit: 2014-10-13 | Discharge: 2014-10-13 | Disposition: A | Discharge status: Home / Self Care | Payer: Commercial Managed Care - PPO | Source: Ambulatory Visit | Attending: Internal Medicine | Admitting: Internal Medicine

## 2014-10-13 DIAGNOSIS — R053 Chronic cough: Secondary | ICD-10-CM

## 2014-10-13 DIAGNOSIS — R05 Cough: Secondary | ICD-10-CM

## 2014-10-16 ENCOUNTER — Encounter (HOSPITAL_COMMUNITY): Payer: Self-pay

## 2014-10-16 ENCOUNTER — Emergency Department (HOSPITAL_COMMUNITY): Payer: Commercial Managed Care - PPO

## 2014-10-16 ENCOUNTER — Emergency Department (HOSPITAL_COMMUNITY)
Admission: EM | Admit: 2014-10-16 | Discharge: 2014-10-17 | Disposition: A | Discharge status: Home / Self Care | Payer: Commercial Managed Care - PPO | Attending: Emergency Medicine | Admitting: Emergency Medicine

## 2014-10-16 DIAGNOSIS — Z8719 Personal history of other diseases of the digestive system: Secondary | ICD-10-CM | POA: Diagnosis not present

## 2014-10-16 DIAGNOSIS — Z862 Personal history of diseases of the blood and blood-forming organs and certain disorders involving the immune mechanism: Secondary | ICD-10-CM | POA: Insufficient documentation

## 2014-10-16 DIAGNOSIS — N390 Urinary tract infection, site not specified: Secondary | ICD-10-CM | POA: Insufficient documentation

## 2014-10-16 DIAGNOSIS — D72829 Elevated white blood cell count, unspecified: Secondary | ICD-10-CM | POA: Diagnosis not present

## 2014-10-16 DIAGNOSIS — R011 Cardiac murmur, unspecified: Secondary | ICD-10-CM | POA: Diagnosis not present

## 2014-10-16 DIAGNOSIS — E785 Hyperlipidemia, unspecified: Secondary | ICD-10-CM | POA: Diagnosis not present

## 2014-10-16 DIAGNOSIS — Z7982 Long term (current) use of aspirin: Secondary | ICD-10-CM | POA: Diagnosis not present

## 2014-10-16 DIAGNOSIS — E119 Type 2 diabetes mellitus without complications: Secondary | ICD-10-CM | POA: Insufficient documentation

## 2014-10-16 DIAGNOSIS — Z792 Long term (current) use of antibiotics: Secondary | ICD-10-CM | POA: Diagnosis not present

## 2014-10-16 DIAGNOSIS — Z87891 Personal history of nicotine dependence: Secondary | ICD-10-CM | POA: Insufficient documentation

## 2014-10-16 DIAGNOSIS — Z7901 Long term (current) use of anticoagulants: Secondary | ICD-10-CM | POA: Diagnosis not present

## 2014-10-16 DIAGNOSIS — R7989 Other specified abnormal findings of blood chemistry: Secondary | ICD-10-CM | POA: Diagnosis not present

## 2014-10-16 DIAGNOSIS — Z79899 Other long term (current) drug therapy: Secondary | ICD-10-CM | POA: Insufficient documentation

## 2014-10-16 DIAGNOSIS — R319 Hematuria, unspecified: Secondary | ICD-10-CM

## 2014-10-16 DIAGNOSIS — R109 Unspecified abdominal pain: Secondary | ICD-10-CM

## 2014-10-16 LAB — CBC WITH DIFFERENTIAL/PLATELET
Basophils Absolute: 0.1 10*3/uL (ref 0.0–0.1)
Basophils Relative: 0 % (ref 0–1)
Eosinophils Absolute: 0.3 10*3/uL (ref 0.0–0.7)
Eosinophils Relative: 2 % (ref 0–5)
HCT: 40.8 % (ref 36.0–46.0)
Hemoglobin: 13.5 g/dL (ref 12.0–15.0)
Lymphocytes Relative: 40 % (ref 12–46)
Lymphs Abs: 4.8 10*3/uL — ABNORMAL HIGH (ref 0.7–4.0)
MCH: 29.1 pg (ref 26.0–34.0)
MCHC: 33.1 g/dL (ref 30.0–36.0)
MCV: 87.9 fL (ref 78.0–100.0)
MONO ABS: 1.2 10*3/uL — AB (ref 0.1–1.0)
Monocytes Relative: 10 % (ref 3–12)
Neutro Abs: 5.6 10*3/uL (ref 1.7–7.7)
Neutrophils Relative %: 48 % (ref 43–77)
Platelets: 301 10*3/uL (ref 150–400)
RBC: 4.64 MIL/uL (ref 3.87–5.11)
RDW: 13.8 % (ref 11.5–15.5)
WBC: 11.8 10*3/uL — ABNORMAL HIGH (ref 4.0–10.5)

## 2014-10-16 LAB — BASIC METABOLIC PANEL
Anion gap: 7 (ref 5–15)
BUN: 11 mg/dL (ref 6–23)
CO2: 26 mmol/L (ref 19–32)
Calcium: 9.1 mg/dL (ref 8.4–10.5)
Chloride: 105 mmol/L (ref 96–112)
Creatinine, Ser: 0.78 mg/dL (ref 0.50–1.10)
GFR calc Af Amer: >90 mL/min (ref >90)
GFR calc non Af Amer: >90 mL/min (ref >90)
GLUCOSE: 143 mg/dL — AB (ref 70–99)
Potassium: 3.7 mmol/L (ref 3.5–5.1)
Sodium: 138 mmol/L (ref 135–145)

## 2014-10-16 LAB — PROTIME-INR
INR: 3.35 — ABNORMAL HIGH (ref 0.00–1.49)
Prothrombin Time: 34.2 seconds — ABNORMAL HIGH (ref 11.6–15.2)

## 2014-10-16 MED ORDER — SODIUM CHLORIDE 0.9 % IV BOLUS (SEPSIS)
1000.0000 mL | Freq: Once | INTRAVENOUS | Status: AC
  Administered 2014-10-16: 1000 mL via INTRAVENOUS

## 2014-10-16 NOTE — ED Notes (Signed)
Pt reports urinated @ 7:30p and urine was pink tinged, urinated @ 8:30p and urine was bright red.  Pt called cardiologist who referred pt to ED d/t pt having mechanical aortic heart valve.   Onset 7pm pt reports left flank pain prior to blood in urine.  No other s/s noted.  Pt takes coumadin. Pt has had viral infection x 6 weeks with several rounds of antibiotics.

## 2014-10-16 NOTE — ED Provider Notes (Addendum)
CSN: [id number]     Arrival date & time 10/16/14  2112 History   First MD Initiated Contact with Patient 10/16/14 2309     This chart was scribed for Merryl Hacker, MD by Forrestine Him, ED Scribe. This patient was seen in room A07C/A07C and the patient's care was started 11:14 PM.   Chief Complaint  Patient presents with  . Hematuria  . Flank Pain   HPI  HPI Comments: [name] is a 59 y.o. female with a PMHx of hyperlipidemia, GERD, DM, mechanical valve who presents to the Emergency Department complaining of intermittent, new hematuria onset 7:30 PM this evening. Last void described as "bright red". Pt also reports constant L sided flank pain that wraps around and radiates to the lower abdomen x 4 hours. Currently pain is rated 3/10. No history of kidney stones in past. No recent fever, chills, nausea, vomiting, or diarrhea. Pt is currently on Coumadin for mechanical aortic heart valve. Ms. Pentland has already taken her daily dose of Coumadin today. Pt with known allergies to Atorvastatin, Codeine, Morphine, Niacin, and Percocet.  Past Medical History  Diagnosis Date  . Hyperlipidemia   . Multinodular thyroid   . GERD (gastroesophageal reflux disease)   . Hx of hysterectomy   . Palpitation   . Aortic stenosis   . Heart murmur     aortic stenosis - dr. Burt Knack is her cardiologist  . Angina     chest tightness and pressure due to aortic stenosis  . Shortness of breath     associated with as  . Diabetes mellitus     newly diagnosed in july, 2012  . Headache(784.0)     migraines when younger  . PONV (postoperative nausea and vomiting)    Past Surgical History  Procedure Laterality Date  . Cesarean section    . Bunionectomy    . Partial hysterectomy    . Abdominal hysterectomy    . Aortic valve replacement  06/25/2011    Procedure: AORTIC VALVE REPLACEMENT (AVR);  Surgeon: Tharon Aquas Adelene Idler, MD;  Location: [place];  Service: Open Heart Surgery;  Laterality: N/A;  . Cardiac  valve replacement      Aortic Valve   06/25/2011  . Tee without cardioversion N/A 09/18/2012    Procedure: TRANSESOPHAGEAL ECHOCARDIOGRAM (TEE);  Surgeon: Fay Records, MD;  Location: [facility] ENDOSCOPY;  Service: Cardiovascular;  Laterality: N/A;   Family History  Problem Relation Age of Onset  . Hypertension Other   . Coronary artery disease Father   . COPD Father   . Diabetes Mother   . Cancer Mother 49    bile duct cancer   History  Substance Use Topics  . Smoking status: Former Smoker -- 1.00 packs/day for 15 years    Types: Cigarettes    Quit date: 08/12/1994  . Smokeless tobacco: Never Used  . Alcohol Use: No   OB History    No data available     Review of Systems  Constitutional: Negative for fever and chills.  Respiratory: Negative for cough, chest tightness and shortness of breath.   Cardiovascular: Negative for chest pain.  Gastrointestinal: Negative for nausea, vomiting, abdominal pain and diarrhea.  Genitourinary: Positive for hematuria and flank pain. Negative for dysuria.  Musculoskeletal: Negative for back pain.  Skin: Negative for wound.  Neurological: Negative for headaches.  Psychiatric/Behavioral: Negative for confusion.  All other systems reviewed and are negative.     Allergies  Atorvastatin; Codeine; Morphine and related;  Niacin; and Percocet  Home Medications   Prior to Admission medications   Medication Sig Start Date End Date Taking? Authorizing Provider  acetaminophen (TYLENOL) 325 MG tablet Take 650 mg by mouth every 6 (six) hours as needed for mild pain.   Yes Historical Provider, MD  amoxicillin (AMOXIL) 500 MG capsule Take 4 capsules by mouth one hour prior to appointment. Patient taking differently: Take 4 capsules by mouth one hour prior to Dental appointments. 09/01/14  Yes Blane Ohara, MD  aspirin 81 MG tablet Take 81 mg by mouth every morning.    Yes Historical Provider, MD  cetirizine (ZYRTEC) 10 MG tablet Take 10 mg by mouth daily  as needed for allergies.   Yes Historical Provider, MD  CRESTOR 20 MG tablet TAKE 1 TABLET BY MOUTH ONCE A DAY 02/22/13  Yes Biagio Borg, MD  metFORMIN (GLUCOPHAGE) 850 MG tablet Take 1 tablet (850 mg total) by mouth 2 (two) times daily with a meal. Patient taking differently: Take 850 mg by mouth every evening.  03/10/13  Yes Biagio Borg, MD  metoprolol tartrate (LOPRESSOR) 25 MG tablet TAKE ONE TABLET BY MOUTH TWICE DAILY  07/18/14  Yes Blane Ohara, MD  tolterodine (DETROL LA) 2 MG 24 hr capsule Take 2 mg by mouth daily.   Yes Historical Provider, MD  warfarin (COUMADIN) 5 MG tablet Take as directed by coumadin clinic Patient taking differently: Take 2.5 mg by mouth daily. Take 5mg  on Sunday, Monday, Wednesday and Friday.  All other days take 2.5mg  06/13/14  Yes Blane Ohara, MD  cephALEXin (KEFLEX) 500 MG capsule Take 1 capsule (500 mg total) by mouth 2 (two) times daily. 10/17/14   Merryl Hacker, MD   Triage Vitals: BP 135/65 mmHg  Pulse 77  Temp(Src) 98.6 F (37 C) (Oral)  Resp 22  SpO2 96%   Physical Exam  Constitutional: She is oriented to person, place, and time. She appears well-developed and well-nourished. No distress.  HENT:  Head: Normocephalic and atraumatic.  Eyes: Pupils are equal, round, and reactive to light.  Neck: Neck supple.  Cardiovascular: Normal rate and regular rhythm.   Murmur heard. Pulmonary/Chest: Effort normal and breath sounds normal. No respiratory distress. She has no wheezes.  Abdominal: Soft. There is no tenderness.  No CVA tenderness  Musculoskeletal: She exhibits no edema.  Neurological: She is alert and oriented to person, place, and time.  Skin: Skin is warm and dry.  Psychiatric: She has a normal mood and affect.  Nursing note and vitals reviewed.   ED Course  Procedures (including critical care time)  DIAGNOSTIC STUDIES: Oxygen Saturation is 98% on RA, Normal by my interpretation.    COORDINATION OF CARE: 11:22 PM- Will  order urinalysis, urine culture, PTINR, BMP, CBC. Discussed treatment plan with pt at bedside and pt agreed to plan.     Labs Review Labs Reviewed  URINALYSIS, ROUTINE W REFLEX MICROSCOPIC - Abnormal; Notable for the following:    Color, Urine RED (*)    APPearance TURBID (*)    Hgb urine dipstick LARGE (*)    Bilirubin Urine LARGE (*)    Ketones, ur 40 (*)    Protein, ur >300 (*)    Nitrite POSITIVE (*)    Leukocytes, UA LARGE (*)    All other components within normal limits  PROTIME-INR - Abnormal; Notable for the following:    Prothrombin Time 34.2 (*)    INR 3.35 (*)    All other  components within normal limits  BASIC METABOLIC PANEL - Abnormal; Notable for the following:    Glucose, Bld 143 (*)    All other components within normal limits  CBC WITH DIFFERENTIAL/PLATELET - Abnormal; Notable for the following:    WBC 11.8 (*)    Lymphs Abs 4.8 (*)    Monocytes Absolute 1.2 (*)    All other components within normal limits  URINE MICROSCOPIC-ADD ON - Abnormal; Notable for the following:    Bacteria, UA FEW (*)    All other components within normal limits  URINE CULTURE    Imaging Review Ct Renal Stone Study  10/17/2014   CLINICAL DATA:  LEFT flank pain beginning at 7 pm , hematuria. On Coumadin. Delayed time viral infection for 6 weeks with several rounds of antibiotics. History of diabetes.  EXAM: CT ABDOMEN AND PELVIS WITHOUT CONTRAST  TECHNIQUE: Multidetector CT imaging of the abdomen and pelvis was performed following the standard protocol without IV contrast.  COMPARISON:  MRI of the abdomen report July 08, 2000  FINDINGS: LUNG BASES: Included view of the lung bases are clear. The included heart appears mildly enlarged, no pericardial fluid collection. Partially imaged apparent aortic bowel by replacement, and median sternotomy.  KIDNEYS/BLADDER: Kidneys are orthotopic, demonstrating normal size and morphology. No nephrolithiasis, hydronephrosis; limited assessment for  renal masses on this nonenhanced examination. The unopacified ureters are normal in course and caliber. Urinary bladder is partially distended and unremarkable.  SOLID ORGANS: The spleen, gallbladder, pancreas and adrenal glands are unremarkable for this non-contrast examination. Minimal focal fatty infiltration about the falciform ligament and, focal lobulated hypodensity without mass effect within the segment 4 of the liver.  GASTROINTESTINAL TRACT: The stomach, small and large bowel are normal in course and caliber without inflammatory changes, the sensitivity may be decreased by lack of enteric contrast. Mild sigmoid diverticulosis. Normal appendix.  PERITONEUM/RETROPERITONEUM: Aortoiliac vessels are normal in course and caliber, mild calcific atherosclerosis. No lymphadenopathy by CT size criteria. Status post hysterectomy. No intraperitoneal free fluid nor free air.  SOFT TISSUES/ OSSEOUS STRUCTURES: Nonsuspicious. Mild to moderate RIGHT hip osteoarthrosis.  IMPRESSION: No urolithiasis, obstructive uropathy nor acute intra-abdominal/pelvic process.  Indeterminate lesion in segment 4 of the liver, differential diagnosis includes hemangioma, focal fat. Prior MRI 2001 reported multiple cavernous hemangiomas in the liver, though images are not available for direct comparison.   Electronically Signed   By: Elon Alas   On: 10/17/2014 00:38     EKG Interpretation   Date/Time:  Sunday October 16 2014 21:42:44 EST Ventricular Rate:  76 PR Interval:  130 QRS Duration: 78 QT Interval:  408 QTC Calculation: 459 R Axis:   77 Text Interpretation:  Sinus rhythm with occasional Premature ventricular  complexes Nonspecific T wave abnormality Abnormal ECG Confirmed by Hazle Coca [phone]) on 10/16/2014 9:48:59 PM      MDM   Final diagnoses:  Flank pain  UTI (lower urinary tract infection)  Hematuria    Patient presents with gross hematuria and flank pain. She is nontoxic on exam. Vital signs are  within normal limits. She's afebrile. She has no CVA tenderness. A history somewhat suggestive of kidney stones; however, patient does not have a history. INR is 3.35 which is mildly supratherapeutic.  Urinalysis notable for nitrite-positive urine with too numerous to count red cells 3-6 white cells and few bacteria. This is suggestive of cystitis versus early pyelonephritis. CT scan of the abdomen obtained to evaluate for kidney stones which is negative. Patient continues  to be comfortable on repeat evaluation. She is afebrile. Mild leukocytosis. Patient given IV Rocephin. Will discharge home on keflex for cystitis vs possible early pyelonephritis. Patient to follow-up with Coumadin clinic tomorrow and will be instructed from there whether not hold Coumadin for one day.  After history, exam, and medical workup I feel the patient has been appropriately medically screened and is safe for discharge home. Pertinent diagnoses were discussed with the patient. Patient was given return precautions.  I personally performed the services described in this documentation, which was scribed in my presence. The recorded information has been reviewed and is accurate.   Merryl Hacker, MD 10/17/14 [id number]  Merryl Hacker, MD 10/17/14 0100

## 2014-10-17 ENCOUNTER — Other Ambulatory Visit: Payer: Self-pay | Admitting: Internal Medicine

## 2014-10-17 ENCOUNTER — Ambulatory Visit (INDEPENDENT_AMBULATORY_CARE_PROVIDER_SITE_OTHER): Payer: Commercial Managed Care - PPO | Admitting: Internal Medicine

## 2014-10-17 ENCOUNTER — Inpatient Hospital Stay
Admission: RE | Admit: 2014-10-17 | Discharge: 2014-10-17 | Disposition: A | Discharge status: Home / Self Care | Payer: Commercial Managed Care - PPO | Source: Ambulatory Visit | Attending: Internal Medicine | Admitting: Internal Medicine

## 2014-10-17 DIAGNOSIS — Z5181 Encounter for therapeutic drug level monitoring: Secondary | ICD-10-CM

## 2014-10-17 DIAGNOSIS — Z952 Presence of prosthetic heart valve: Secondary | ICD-10-CM

## 2014-10-17 DIAGNOSIS — R319 Hematuria, unspecified: Secondary | ICD-10-CM

## 2014-10-17 DIAGNOSIS — Z954 Presence of other heart-valve replacement: Secondary | ICD-10-CM

## 2014-10-17 LAB — URINALYSIS, ROUTINE W REFLEX MICROSCOPIC
Glucose, UA: NEGATIVE mg/dL
KETONES UR: 40 mg/dL — AB
NITRITE: POSITIVE — AB
Specific Gravity, Urine: 1.012 (ref 1.005–1.030)
UROBILINOGEN UA: 1 mg/dL (ref 0.0–1.0)
pH: 5 (ref 5.0–8.0)

## 2014-10-17 LAB — URINE MICROSCOPIC-ADD ON

## 2014-10-17 MED ORDER — CEPHALEXIN 500 MG PO CAPS: 500.0000 mg | ORAL_CAPSULE | Freq: Two times a day (BID) | ORAL | Status: DC

## 2014-10-17 MED ORDER — CEFTRIAXONE SODIUM 1 G IJ SOLR
1.0000 g | Freq: Once | INTRAMUSCULAR | Status: AC
  Administered 2014-10-17: 1 g via INTRAVENOUS
  Filled 2014-10-17: qty 10

## 2014-10-17 NOTE — ED Notes (Signed)
CT completed

## 2014-10-17 NOTE — Discharge Instructions (Signed)

## 2014-10-17 NOTE — ED Notes (Signed)
Patient states about 730pm she started with left flank pain and noticed her urine was pink tinged.  About 830pm she went to void and noticed that her urine was red.  Called her MD and was instructed to come to the ED

## 2014-10-17 NOTE — ED Notes (Signed)
Discharge instructions and prescription reviewed, voiced understanding.

## 2014-10-18 LAB — URINE CULTURE

## 2014-10-19 ENCOUNTER — Emergency Department (HOSPITAL_COMMUNITY): Payer: Commercial Managed Care - PPO

## 2014-10-19 ENCOUNTER — Inpatient Hospital Stay (HOSPITAL_COMMUNITY): Payer: Commercial Managed Care - PPO

## 2014-10-19 ENCOUNTER — Encounter (HOSPITAL_COMMUNITY): Payer: Self-pay | Admitting: Emergency Medicine

## 2014-10-19 ENCOUNTER — Inpatient Hospital Stay (HOSPITAL_COMMUNITY)
Admission: EM | Admit: 2014-10-19 | Discharge: 2014-10-28 | DRG: 668 | Disposition: A | Discharge status: Home / Self Care | Payer: Commercial Managed Care - PPO | Attending: Internal Medicine | Admitting: Internal Medicine

## 2014-10-19 DIAGNOSIS — I350 Nonrheumatic aortic (valve) stenosis: Secondary | ICD-10-CM | POA: Diagnosis present

## 2014-10-19 DIAGNOSIS — K219 Gastro-esophageal reflux disease without esophagitis: Secondary | ICD-10-CM | POA: Diagnosis present

## 2014-10-19 DIAGNOSIS — Z8249 Family history of ischemic heart disease and other diseases of the circulatory system: Secondary | ICD-10-CM | POA: Diagnosis not present

## 2014-10-19 DIAGNOSIS — N23 Unspecified renal colic: Secondary | ICD-10-CM | POA: Diagnosis present

## 2014-10-19 DIAGNOSIS — Z7901 Long term (current) use of anticoagulants: Secondary | ICD-10-CM | POA: Diagnosis not present

## 2014-10-19 DIAGNOSIS — E876 Hypokalemia: Secondary | ICD-10-CM | POA: Diagnosis not present

## 2014-10-19 DIAGNOSIS — E785 Hyperlipidemia, unspecified: Secondary | ICD-10-CM | POA: Diagnosis present

## 2014-10-19 DIAGNOSIS — E8770 Fluid overload, unspecified: Secondary | ICD-10-CM | POA: Insufficient documentation

## 2014-10-19 DIAGNOSIS — Z79899 Other long term (current) drug therapy: Secondary | ICD-10-CM

## 2014-10-19 DIAGNOSIS — F411 Generalized anxiety disorder: Secondary | ICD-10-CM | POA: Diagnosis present

## 2014-10-19 DIAGNOSIS — R310 Gross hematuria: Secondary | ICD-10-CM | POA: Diagnosis present

## 2014-10-19 DIAGNOSIS — R109 Unspecified abdominal pain: Secondary | ICD-10-CM | POA: Diagnosis present

## 2014-10-19 DIAGNOSIS — R1032 Left lower quadrant pain: Secondary | ICD-10-CM | POA: Diagnosis present

## 2014-10-19 DIAGNOSIS — D62 Acute posthemorrhagic anemia: Secondary | ICD-10-CM | POA: Diagnosis present

## 2014-10-19 DIAGNOSIS — Z7982 Long term (current) use of aspirin: Secondary | ICD-10-CM

## 2014-10-19 DIAGNOSIS — Z6832 Body mass index (BMI) 32.0-32.9, adult: Secondary | ICD-10-CM | POA: Diagnosis not present

## 2014-10-19 DIAGNOSIS — Z87440 Personal history of urinary (tract) infections: Secondary | ICD-10-CM | POA: Diagnosis not present

## 2014-10-19 DIAGNOSIS — D649 Anemia, unspecified: Secondary | ICD-10-CM | POA: Diagnosis present

## 2014-10-19 DIAGNOSIS — I359 Nonrheumatic aortic valve disorder, unspecified: Secondary | ICD-10-CM | POA: Diagnosis present

## 2014-10-19 DIAGNOSIS — N131 Hydronephrosis with ureteral stricture, not elsewhere classified: Principal | ICD-10-CM | POA: Diagnosis present

## 2014-10-19 DIAGNOSIS — Z87891 Personal history of nicotine dependence: Secondary | ICD-10-CM

## 2014-10-19 DIAGNOSIS — Z952 Presence of prosthetic heart valve: Secondary | ICD-10-CM | POA: Diagnosis not present

## 2014-10-19 DIAGNOSIS — Z885 Allergy status to narcotic agent status: Secondary | ICD-10-CM

## 2014-10-19 DIAGNOSIS — Z888 Allergy status to other drugs, medicaments and biological substances status: Secondary | ICD-10-CM | POA: Diagnosis not present

## 2014-10-19 DIAGNOSIS — I5031 Acute diastolic (congestive) heart failure: Secondary | ICD-10-CM | POA: Diagnosis present

## 2014-10-19 DIAGNOSIS — E871 Hypo-osmolality and hyponatremia: Secondary | ICD-10-CM | POA: Insufficient documentation

## 2014-10-19 DIAGNOSIS — N1330 Unspecified hydronephrosis: Secondary | ICD-10-CM

## 2014-10-19 DIAGNOSIS — Z419 Encounter for procedure for purposes other than remedying health state, unspecified: Secondary | ICD-10-CM

## 2014-10-19 DIAGNOSIS — E042 Nontoxic multinodular goiter: Secondary | ICD-10-CM | POA: Diagnosis present

## 2014-10-19 DIAGNOSIS — E119 Type 2 diabetes mellitus without complications: Secondary | ICD-10-CM | POA: Diagnosis present

## 2014-10-19 DIAGNOSIS — E669 Obesity, unspecified: Secondary | ICD-10-CM | POA: Diagnosis present

## 2014-10-19 DIAGNOSIS — N390 Urinary tract infection, site not specified: Secondary | ICD-10-CM | POA: Diagnosis present

## 2014-10-19 DIAGNOSIS — Z90710 Acquired absence of both cervix and uterus: Secondary | ICD-10-CM

## 2014-10-19 DIAGNOSIS — R0602 Shortness of breath: Secondary | ICD-10-CM

## 2014-10-19 DIAGNOSIS — R079 Chest pain, unspecified: Secondary | ICD-10-CM | POA: Diagnosis present

## 2014-10-19 DIAGNOSIS — Z833 Family history of diabetes mellitus: Secondary | ICD-10-CM | POA: Diagnosis not present

## 2014-10-19 LAB — URINALYSIS, ROUTINE W REFLEX MICROSCOPIC
Bilirubin Urine: NEGATIVE
Glucose, UA: NEGATIVE mg/dL
KETONES UR: NEGATIVE mg/dL
Leukocytes, UA: NEGATIVE
NITRITE: NEGATIVE
PROTEIN: NEGATIVE mg/dL
SPECIFIC GRAVITY, URINE: 1.029 (ref 1.005–1.030)
Urobilinogen, UA: 0.2 mg/dL (ref 0.0–1.0)
pH: 5 (ref 5.0–8.0)

## 2014-10-19 LAB — COMPREHENSIVE METABOLIC PANEL
ALBUMIN: 3.9 g/dL (ref 3.5–5.2)
ALK PHOS: 75 U/L (ref 39–117)
ALT: 15 U/L (ref 0–35)
ANION GAP: 8 (ref 5–15)
AST: 26 U/L (ref 0–37)
BILIRUBIN TOTAL: 0.5 mg/dL (ref 0.3–1.2)
BUN: 14 mg/dL (ref 6–23)
CO2: 22 mmol/L (ref 19–32)
CREATININE: 0.94 mg/dL (ref 0.50–1.10)
Calcium: 8.2 mg/dL — ABNORMAL LOW (ref 8.4–10.5)
Chloride: 99 mmol/L (ref 96–112)
GFR calc non Af Amer: 66 mL/min — ABNORMAL LOW (ref >90)
GFR, EST AFRICAN AMERICAN: 77 mL/min — AB (ref >90)
Glucose, Bld: 218 mg/dL — ABNORMAL HIGH (ref 70–99)
Potassium: 3.9 mmol/L (ref 3.5–5.1)
Sodium: 129 mmol/L — ABNORMAL LOW (ref 135–145)
Total Protein: 7 g/dL (ref 6.0–8.3)

## 2014-10-19 LAB — CBC WITH DIFFERENTIAL/PLATELET
BASOS PCT: 1 % (ref 0–1)
Basophils Absolute: 0.1 10*3/uL (ref 0.0–0.1)
EOS PCT: 2 % (ref 0–5)
Eosinophils Absolute: 0.2 10*3/uL (ref 0.0–0.7)
HCT: 33 % — ABNORMAL LOW (ref 36.0–46.0)
Hemoglobin: 11 g/dL — ABNORMAL LOW (ref 12.0–15.0)
Lymphocytes Relative: 23 % (ref 12–46)
Lymphs Abs: 2.7 10*3/uL (ref 0.7–4.0)
MCH: 29.9 pg (ref 26.0–34.0)
MCHC: 33.3 g/dL (ref 30.0–36.0)
MCV: 89.7 fL (ref 78.0–100.0)
MONO ABS: 1.1 10*3/uL — AB (ref 0.1–1.0)
Monocytes Relative: 10 % (ref 3–12)
Neutro Abs: 7.6 10*3/uL (ref 1.7–7.7)
Neutrophils Relative %: 64 % (ref 43–77)
Platelets: 281 10*3/uL (ref 150–400)
RBC: 3.68 MIL/uL — ABNORMAL LOW (ref 3.87–5.11)
RDW: 13.5 % (ref 11.5–15.5)
WBC: 11.6 10*3/uL — ABNORMAL HIGH (ref 4.0–10.5)

## 2014-10-19 LAB — GLUCOSE, CAPILLARY
GLUCOSE-CAPILLARY: 143 mg/dL — AB (ref 70–99)
Glucose-Capillary: 114 mg/dL — ABNORMAL HIGH (ref 70–99)

## 2014-10-19 LAB — HEPARIN LEVEL (UNFRACTIONATED)

## 2014-10-19 LAB — TROPONIN I: Troponin I: <0.03 ng/mL (ref <0.031)

## 2014-10-19 LAB — PROTIME-INR
INR: 1.66 — AB (ref 0.00–1.49)
PROTHROMBIN TIME: 19.7 s — AB (ref 11.6–15.2)

## 2014-10-19 LAB — URINE MICROSCOPIC-ADD ON

## 2014-10-19 LAB — MAGNESIUM: Magnesium: 1.8 mg/dL (ref 1.5–2.5)

## 2014-10-19 MED ORDER — HYDROMORPHONE HCL 1 MG/ML IJ SOLN: 1.0000 mg | INTRAMUSCULAR | Status: DC | PRN

## 2014-10-19 MED ORDER — SORBITOL 70 % SOLN
30.0000 mL | Freq: Every day | Status: DC | PRN
Start: 2014-10-19 — End: 2014-10-28
  Filled 2014-10-19: qty 30

## 2014-10-19 MED ORDER — ASPIRIN 81 MG PO CHEW
81.0000 mg | CHEWABLE_TABLET | Freq: Every day | ORAL | Status: DC
  Administered 2014-10-22 – 2014-10-28 (×6): 81 mg via ORAL
  Filled 2014-10-19 (×10): qty 1

## 2014-10-19 MED ORDER — ONDANSETRON HCL 4 MG/2ML IJ SOLN
4.0000 mg | Freq: Once | INTRAMUSCULAR | Status: AC
  Administered 2014-10-19: 4 mg via INTRAVENOUS
  Filled 2014-10-19: qty 2

## 2014-10-19 MED ORDER — FESOTERODINE FUMARATE ER 4 MG PO TB24
4.0000 mg | ORAL_TABLET | Freq: Every day | ORAL | Status: DC
  Filled 2014-10-19 (×6): qty 1

## 2014-10-19 MED ORDER — LORATADINE 10 MG PO TABS
10.0000 mg | ORAL_TABLET | Freq: Every day | ORAL | Status: DC
  Filled 2014-10-19 (×9): qty 1

## 2014-10-19 MED ORDER — GI COCKTAIL ~~LOC~~
30.0000 mL | Freq: Three times a day (TID) | ORAL | Status: DC | PRN
  Filled 2014-10-19: qty 30

## 2014-10-19 MED ORDER — ONDANSETRON HCL 4 MG PO TABS: 4.0000 mg | ORAL_TABLET | Freq: Four times a day (QID) | ORAL | Status: DC | PRN

## 2014-10-19 MED ORDER — INSULIN ASPART 100 UNIT/ML ~~LOC~~ SOLN
0.0000 [IU] | Freq: Three times a day (TID) | SUBCUTANEOUS | Status: DC
  Administered 2014-10-19 – 2014-10-21 (×7): 3 [IU] via SUBCUTANEOUS
  Administered 2014-10-22 (×2): 4 [IU] via SUBCUTANEOUS
  Administered 2014-10-22: 11 [IU] via SUBCUTANEOUS
  Administered 2014-10-23 (×2): 4 [IU] via SUBCUTANEOUS
  Administered 2014-10-24 (×2): 3 [IU] via SUBCUTANEOUS
  Administered 2014-10-24: 7 [IU] via SUBCUTANEOUS
  Administered 2014-10-25 – 2014-10-26 (×3): 3 [IU] via SUBCUTANEOUS
  Administered 2014-10-26: 7 [IU] via SUBCUTANEOUS
  Administered 2014-10-27: 3 [IU] via SUBCUTANEOUS
  Administered 2014-10-27: 4 [IU] via SUBCUTANEOUS
  Administered 2014-10-27: 3 [IU] via SUBCUTANEOUS

## 2014-10-19 MED ORDER — HEPARIN (PORCINE) IN NACL 100-0.45 UNIT/ML-% IJ SOLN
1450.0000 [IU]/h | INTRAMUSCULAR | Status: DC
  Administered 2014-10-20: 1450 [IU]/h via INTRAVENOUS
  Filled 2014-10-19 (×3): qty 250

## 2014-10-19 MED ORDER — DEXTROSE 5 % IV SOLN
1.0000 g | INTRAVENOUS | Status: DC
  Administered 2014-10-19 – 2014-10-24 (×6): 1 g via INTRAVENOUS
  Filled 2014-10-19 (×7): qty 10

## 2014-10-19 MED ORDER — HYDROMORPHONE HCL 1 MG/ML IJ SOLN
1.0000 mg | Freq: Once | INTRAMUSCULAR | Status: AC
  Administered 2014-10-19: 1 mg via INTRAVENOUS
  Filled 2014-10-19: qty 1

## 2014-10-19 MED ORDER — ONDANSETRON HCL 4 MG PO TABS: 8.0000 mg | ORAL_TABLET | Freq: Four times a day (QID) | ORAL | Status: DC | PRN

## 2014-10-19 MED ORDER — IOHEXOL 300 MG/ML  SOLN
100.0000 mL | Freq: Once | INTRAMUSCULAR | Status: AC | PRN
  Administered 2014-10-19: 100 mL via INTRAVENOUS

## 2014-10-19 MED ORDER — FENTANYL CITRATE 0.05 MG/ML IJ SOLN
75.0000 ug | Freq: Once | INTRAMUSCULAR | Status: AC
  Administered 2014-10-19: 75 ug via INTRAVENOUS
  Filled 2014-10-19: qty 2

## 2014-10-19 MED ORDER — SODIUM CHLORIDE 0.9 % IV SOLN
INTRAVENOUS | Status: DC
  Administered 2014-10-19 – 2014-10-21 (×4): via INTRAVENOUS

## 2014-10-19 MED ORDER — IPRATROPIUM BROMIDE 0.02 % IN SOLN: 0.5000 mg | RESPIRATORY_TRACT | Status: DC | PRN

## 2014-10-19 MED ORDER — IOHEXOL 300 MG/ML  SOLN
50.0000 mL | Freq: Once | INTRAMUSCULAR | Status: AC | PRN
  Administered 2014-10-19: 50 mL via ORAL

## 2014-10-19 MED ORDER — ROSUVASTATIN CALCIUM 20 MG PO TABS
20.0000 mg | ORAL_TABLET | Freq: Every day | ORAL | Status: DC
Start: 2014-10-20 — End: 2014-10-28
  Administered 2014-10-20 – 2014-10-28 (×8): 20 mg via ORAL
  Filled 2014-10-19 (×9): qty 1

## 2014-10-19 MED ORDER — ACETAMINOPHEN 325 MG PO TABS
650.0000 mg | ORAL_TABLET | Freq: Four times a day (QID) | ORAL | Status: DC | PRN
  Administered 2014-10-20 – 2014-10-23 (×4): 650 mg via ORAL
  Filled 2014-10-19 (×5): qty 2

## 2014-10-19 MED ORDER — METOPROLOL TARTRATE 25 MG PO TABS
25.0000 mg | ORAL_TABLET | Freq: Two times a day (BID) | ORAL | Status: DC
  Administered 2014-10-20 – 2014-10-28 (×17): 25 mg via ORAL
  Filled 2014-10-19 (×18): qty 1

## 2014-10-19 MED ORDER — ALBUTEROL SULFATE (2.5 MG/3ML) 0.083% IN NEBU: 2.5000 mg | INHALATION_SOLUTION | RESPIRATORY_TRACT | Status: DC | PRN

## 2014-10-19 MED ORDER — ALUM & MAG HYDROXIDE-SIMETH 200-200-20 MG/5ML PO SUSP: 30.0000 mL | Freq: Four times a day (QID) | ORAL | Status: DC | PRN

## 2014-10-19 MED ORDER — TRAMADOL HCL 50 MG PO TABS
100.0000 mg | ORAL_TABLET | Freq: Four times a day (QID) | ORAL | Status: DC | PRN
  Administered 2014-10-19 – 2014-10-20 (×3): 100 mg via ORAL
  Filled 2014-10-19 (×3): qty 2

## 2014-10-19 MED ORDER — LIDOCAINE HCL 2 % EX GEL
CUTANEOUS | Status: AC
  Filled 2014-10-19: qty 10

## 2014-10-19 MED ORDER — MAGNESIUM CITRATE PO SOLN: 1.0000 | Freq: Once | ORAL | Status: AC | PRN

## 2014-10-19 MED ORDER — SENNOSIDES-DOCUSATE SODIUM 8.6-50 MG PO TABS: 1.0000 | ORAL_TABLET | Freq: Every evening | ORAL | Status: DC | PRN

## 2014-10-19 MED ORDER — CETYLPYRIDINIUM CHLORIDE 0.05 % MT LIQD
7.0000 mL | Freq: Two times a day (BID) | OROMUCOSAL | Status: DC
  Administered 2014-10-20 – 2014-10-27 (×10): 7 mL via OROMUCOSAL

## 2014-10-19 MED ORDER — SODIUM CHLORIDE 0.9 % IV BOLUS (SEPSIS)
1000.0000 mL | Freq: Once | INTRAVENOUS | Status: AC
  Administered 2014-10-19: 1000 mL via INTRAVENOUS

## 2014-10-19 MED ORDER — ONDANSETRON HCL 4 MG/2ML IJ SOLN
4.0000 mg | Freq: Four times a day (QID) | INTRAMUSCULAR | Status: DC | PRN
  Administered 2014-10-19: 4 mg via INTRAVENOUS
  Filled 2014-10-19: qty 2

## 2014-10-19 MED ORDER — SODIUM CHLORIDE 0.9 % IJ SOLN
3.0000 mL | Freq: Two times a day (BID) | INTRAMUSCULAR | Status: DC
  Administered 2014-10-21 – 2014-10-24 (×4): 3 mL via INTRAVENOUS

## 2014-10-19 MED ORDER — ONDANSETRON 8 MG/NS 50 ML IVPB
8.0000 mg | Freq: Four times a day (QID) | INTRAVENOUS | Status: DC | PRN
  Administered 2014-10-19 – 2014-10-20 (×2): 8 mg via INTRAVENOUS
  Filled 2014-10-19 (×5): qty 8

## 2014-10-19 MED ORDER — DOCUSATE SODIUM 100 MG PO CAPS
100.0000 mg | ORAL_CAPSULE | Freq: Two times a day (BID) | ORAL | Status: DC
  Administered 2014-10-20 – 2014-10-28 (×13): 100 mg via ORAL
  Filled 2014-10-19 (×23): qty 1

## 2014-10-19 MED ORDER — HEPARIN (PORCINE) IN NACL 100-0.45 UNIT/ML-% IJ SOLN
1100.0000 [IU]/h | INTRAMUSCULAR | Status: DC
  Administered 2014-10-19: 1100 [IU]/h via INTRAVENOUS
  Filled 2014-10-19: qty 250

## 2014-10-19 NOTE — Consult Note (Signed)
Urology Consult   Physician requesting consult: Dr. Irine Seal  Reason for consult: Gross hematuria, left hydronephrosis  History of Present Illness: [name] is a 59 y.o. who is chronically anticoagulated with Coumadin for a mechanical heart valve. She was seen by Dr. Matilde Sprang a few days ago for gross hematuria.  She had been undergoing treatment for a possible UTI after evaluation in the ED and PCP's office although her cultures were not concerning. A prior stone protocol CT scan was unremarkable with no stones or evidence of other urologic findings noted.  She was scheduled to undergo further evalution by Dr. Matilde Sprang next week including contrasted CT imaging and cystoscopy.  However, she developed worsening gross hematuria and severe left flank pain today and she again presented to the ED.  Her urine began clearing and a renal ultrasound was unremarkable without hydronephrosis.    She was seen by [facility] for pain management and further evaluation of her pain symptoms.  A contrasted CT scan was performed that now demonstrated left hydronephrosis and high attenuation material in the left renal pelvis/proximal ureter consistent with clot vs possible tumor.   Her pain is currently controlled with pain medication.  Her INR had been supratherapeutic at 3.3 a few days ago and she had not taken her Coumadin yesterday. Her INR today was 1.6.  She is currently on IV heparin.  She denies a history of voiding or storage urinary symptoms, hematuria, UTIs, STDs, urolithiasis, GU malignancy/trauma/surgery.  Past Medical History  Diagnosis Date  . Hyperlipidemia   . Multinodular thyroid   . GERD (gastroesophageal reflux disease)   . Hx of hysterectomy   . Palpitation   . Aortic stenosis   . Heart murmur     aortic stenosis - dr. Burt Knack is her cardiologist  . Angina     chest tightness and pressure due to aortic stenosis  . Shortness of breath     associated with as  .  Diabetes mellitus     newly diagnosed in july, 2012  . Headache(784.0)     migraines when younger  . PONV (postoperative nausea and vomiting)     Past Surgical History  Procedure Laterality Date  . Cesarean section    . Bunionectomy    . Partial hysterectomy    . Abdominal hysterectomy    . Aortic valve replacement  06/25/2011    Procedure: AORTIC VALVE REPLACEMENT (AVR);  Surgeon: Tharon Aquas Adelene Idler, MD;  Location: [facility];  Service: Open Heart Surgery;  Laterality: N/A;  . Cardiac valve replacement      Aortic Valve   06/25/2011  . Tee without cardioversion N/A 09/18/2012    Procedure: TRANSESOPHAGEAL ECHOCARDIOGRAM (TEE);  Surgeon: Fay Records, MD;  Location: [facility] ENDOSCOPY;  Service: Cardiovascular;  Laterality: N/A;     Current Hospital Medications:  Home meds:    Medication List    ASK your doctor about these medications        acetaminophen 325 MG tablet  Commonly known as:  TYLENOL  Take 650 mg by mouth every 6 (six) hours as needed for mild pain.     amoxicillin 500 MG capsule  Commonly known as:  AMOXIL  Take 4 capsules by mouth one hour prior to appointment.     aspirin 81 MG tablet  Take 81 mg by mouth every morning.     cephALEXin 500 MG capsule  Commonly known as:  KEFLEX  Take 1 capsule (500 mg total) by mouth 2 (  two) times daily.     cetirizine 10 MG tablet  Commonly known as:  ZYRTEC  Take 10 mg by mouth daily as needed for allergies.     CRESTOR 20 MG tablet  Generic drug:  rosuvastatin  TAKE 1 TABLET BY MOUTH ONCE A DAY     metFORMIN 850 MG tablet  Commonly known as:  GLUCOPHAGE  Take 1 tablet (850 mg total) by mouth 2 (two) times daily with a meal.     metoprolol tartrate 25 MG tablet  Commonly known as:  LOPRESSOR  TAKE ONE TABLET BY MOUTH TWICE DAILY     tolterodine 2 MG 24 hr capsule  Commonly known as:  DETROL LA  Take 2 mg by mouth daily.     warfarin 5 MG tablet  Commonly known as:  COUMADIN  Take as directed by coumadin clinic         Scheduled Meds: . antiseptic oral rinse  7 mL Mouth Rinse BID  . aspirin  81 mg Oral Daily  . cefTRIAXone (ROCEPHIN)  IV  1 g Intravenous Q24H  . docusate sodium  100 mg Oral BID  . fesoterodine  4 mg Oral Daily  . insulin aspart  0-20 Units Subcutaneous TID WC  . lidocaine      . [START ON 10/20/2014] loratadine  10 mg Oral Daily  . metoprolol tartrate  25 mg Oral BID  . [START ON 10/20/2014] rosuvastatin  20 mg Oral Daily  . sodium chloride  3 mL Intravenous Q12H   Continuous Infusions: . heparin 1,100 Units/hr (10/19/14 1314)  . sodium chloride 0.9 % 1,000 mL infusion 100 mL/hr at 10/19/14 1314   PRN Meds:.acetaminophen, albuterol, alum & mag hydroxide-simeth, gi cocktail, HYDROmorphone (DILAUDID) injection, ipratropium, magnesium citrate, ondansetron **OR** ondansetron (ZOFRAN) IV, senna-docusate, sorbitol, traMADol  Allergies:  Allergies  Allergen Reactions  . Atorvastatin     REACTION: myalgias Lipitor  . Codeine     REACTION: N/V  . Morphine And Related Nausea And Vomiting  . Niacin     REACTION: flushing  . Percocet [Oxycodone-Acetaminophen] Other (See Comments)    "blisters in mouth took in 1995"    Family History  Problem Relation Age of Onset  . Hypertension Other   . Coronary artery disease Father   . COPD Father   . Diabetes Mother   . Cancer Mother 20    bile duct cancer    Social History:  reports that she quit smoking about 20 years ago. Her smoking use included Cigarettes. She has a 15 pack-year smoking history. She has never used smokeless tobacco. She reports that she does not drink alcohol or use illicit drugs.  ROS: A complete review of systems was performed.  All systems are negative except for pertinent findings as noted.  Physical Exam:  Vital signs in last 24 hours: Temp:  [97.5 F (36.4 C)-98.9 F (37.2 C)] 98.4 F (36.9 C) (03/09 1625) Pulse Rate:  [75-89] 89 (03/09 1625) Resp:  [14-22] 18 (03/09 1625) BP: (136-156)/(66-88)  137/66 mmHg (03/09 1625) SpO2:  [88 %-99 %] 92 % (03/09 1625) Weight:  [81.647 kg (180 lb)] 81.647 kg (180 lb) (03/09 1225) Constitutional:  Alert and oriented, No acute distress Cardiovascular: Regular rate and rhythm, No JVD Respiratory: Normal respiratory effort, Lungs clear bilaterally GI: Abdomen is soft, nondistended, no abdominal masses with minimal left CVAT GU: No CVA tenderness Lymphatic: No lymphadenopathy Neurologic: Grossly intact, no focal deficits Psychiatric: Normal mood and affect  Laboratory Data:  Recent Labs  10/16/14 2158 10/19/14 0844  WBC 11.8* 11.6*  HGB 13.5 11.0*  HCT 40.8 33.0*  PLT 301 281     Recent Labs  10/16/14 2158 10/19/14 0844  NA 138 129*  K 3.7 3.9  CL 105 99  GLUCOSE 143* 218*  BUN 11 14  CALCIUM 9.1 8.2*  CREATININE 0.78 0.94     Results for orders placed or performed during the hospital encounter of 10/19/14 (from the past 24 hour(s))  Comprehensive metabolic panel     Status: Abnormal   Collection Time: 10/19/14  8:44 AM  Result Value Ref Range   Sodium 129 (L) 135 - 145 mmol/L   Potassium 3.9 3.5 - 5.1 mmol/L   Chloride 99 96 - 112 mmol/L   CO2 22 19 - 32 mmol/L   Glucose, Bld 218 (H) 70 - 99 mg/dL   BUN 14 6 - 23 mg/dL   Creatinine, Ser 0.94 0.50 - 1.10 mg/dL   Calcium 8.2 (L) 8.4 - 10.5 mg/dL   Total Protein 7.0 6.0 - 8.3 g/dL   Albumin 3.9 3.5 - 5.2 g/dL   AST 26 0 - 37 U/L   ALT 15 0 - 35 U/L   Alkaline Phosphatase 75 39 - 117 U/L   Total Bilirubin 0.5 0.3 - 1.2 mg/dL   GFR calc non Af Amer 66 (L) >90 mL/min   GFR calc Af Amer 77 (L) >90 mL/min   Anion gap 8 5 - 15  CBC with Differential     Status: Abnormal   Collection Time: 10/19/14  8:44 AM  Result Value Ref Range   WBC 11.6 (H) 4.0 - 10.5 K/uL   RBC 3.68 (L) 3.87 - 5.11 MIL/uL   Hemoglobin 11.0 (L) 12.0 - 15.0 g/dL   HCT 33.0 (L) 36.0 - 46.0 %   MCV 89.7 78.0 - 100.0 fL   MCH 29.9 26.0 - 34.0 pg   MCHC 33.3 30.0 - 36.0 g/dL   RDW 13.5 11.5 - 15.5  %   Platelets 281 150 - 400 K/uL   Neutrophils Relative % 64 43 - 77 %   Neutro Abs 7.6 1.7 - 7.7 K/uL   Lymphocytes Relative 23 12 - 46 %   Lymphs Abs 2.7 0.7 - 4.0 K/uL   Monocytes Relative 10 3 - 12 %   Monocytes Absolute 1.1 (H) 0.1 - 1.0 K/uL   Eosinophils Relative 2 0 - 5 %   Eosinophils Absolute 0.2 0.0 - 0.7 K/uL   Basophils Relative 1 0 - 1 %   Basophils Absolute 0.1 0.0 - 0.1 K/uL  Protime-INR     Status: Abnormal   Collection Time: 10/19/14  8:44 AM  Result Value Ref Range   Prothrombin Time 19.7 (H) 11.6 - 15.2 seconds   INR 1.66 (H) 0.00 - 1.49  Urinalysis, Routine w reflex microscopic     Status: Abnormal   Collection Time: 10/19/14 10:30 AM  Result Value Ref Range   Color, Urine AMBER (A) YELLOW   APPearance TURBID (A) CLEAR   Specific Gravity, Urine 1.029 1.005 - 1.030   pH 5.0 5.0 - 8.0   Glucose, UA NEGATIVE NEGATIVE mg/dL   Hgb urine dipstick LARGE (A) NEGATIVE   Bilirubin Urine NEGATIVE NEGATIVE   Ketones, ur NEGATIVE NEGATIVE mg/dL   Protein, ur NEGATIVE NEGATIVE mg/dL   Urobilinogen, UA 0.2 0.0 - 1.0 mg/dL   Nitrite NEGATIVE NEGATIVE   Leukocytes, UA NEGATIVE NEGATIVE  Urine microscopic-add on  Status: Abnormal   Collection Time: 10/19/14 10:30 AM  Result Value Ref Range   RBC / HPF 21-50 <3 RBC/hpf   Crystals URIC ACID CRYSTALS (A) NEGATIVE   Urine-Other AMORPHOUS URATES/PHOSPHATES   Troponin I (q 6hr x 3)     Status: None   Collection Time: 10/19/14  1:08 PM  Result Value Ref Range   Troponin I <0.03 <0.031 ng/mL  Magnesium     Status: None   Collection Time: 10/19/14  1:08 PM  Result Value Ref Range   Magnesium 1.8 1.5 - 2.5 mg/dL  Glucose, capillary     Status: Abnormal   Collection Time: 10/19/14  5:49 PM  Result Value Ref Range   Glucose-Capillary 143 (H) 70 - 99 mg/dL   Recent Results (from the past 240 hour(s))  Urine culture     Status: None   Collection Time: 10/16/14 10:00 PM  Result Value Ref Range Status   Specimen  Description URINE, RANDOM  Final   Special Requests NONE  Final   Colony Count   Final    2,000 COLONIES/ML Performed at [facility]    Culture   Final    INSIGNIFICANT GROWTH Performed at [facility]    Report Status 10/18/2014 FINAL  Final    Renal Function:  Recent Labs  10/16/14 2158 10/19/14 0844  CREATININE 0.78 0.94   Estimated Creatinine Clearance: 65.4 mL/min (by C-G formula based on Cr of 0.94).  Radiologic Imaging: Ct Abdomen Pelvis W Wo Contrast  10/19/2014   CLINICAL DATA:  59 year old female with recent history of urinary tract infection, recently passing blood clots in her urine. Abdominal pain since yesterday. Severe left flank pain since this morning. Unable to urinate.  EXAM: CT ABDOMEN AND PELVIS WITHOUT AND WITH CONTRAST  TECHNIQUE: Multidetector CT imaging of the abdomen and pelvis was performed following the standard protocol before and following the bolus administration of intravenous contrast.  CONTRAST:  133mL OMNIPAQUE IOHEXOL 300 MG/ML  SOLN  COMPARISON:  CT of the abdomen and pelvis 10/16/2014.  FINDINGS: Lower chest: Mild dependent atelectasis in the lower lobes of the lungs bilaterally (left greater than right). Mechanical aortic valve. Status post median sternotomy.  Hepatobiliary: Mild diffuse hepatic steatosis, with area of more significant focal fatty infiltration in the central aspect of segment 4B of the liver, and adjacent to the falciform ligament anteriorly in segment 4B, similar to the prior examination. There is an additional hypovascular lesion measuring 1 cm in the periphery of segment 6 (image 30 of series 6), which is unchanged in size compared to prior study 04/15/2012, and although technically indeterminate, considered benign. No other suspicious appearing hepatic lesions are noted. No intra or extrahepatic biliary ductal dilatation. Gallbladder is normal in appearance.  Pancreas: Unremarkable.  Spleen: Unremarkable.   Adrenals/Urinary Tract: No calcifications are identified within the collecting system of either kidney, along the course of either ureter, or within the lumen of the urinary bladder. Right kidney is normal in appearance. New moderate left-sided hydronephrosis. Notably, the left renal pelvis, most proximal aspect of the left ureter, and the lower pole collecting system of the left kidney contain high attenuation material (45 Hounsfield units), favored to represent blood. Extensive left-sided perinephric stranding. Generalized hypoperfusion throughout the renal parenchyma of the left kidney as compared to the right, without frank striated nephrogram. Delayed images demonstrate no significant excretion of contrast material by the left kidney. Normal right-sided excretion is noted. Urinary bladder is generally normal in appearance, with  exception of a nondependent locule of gas, presumably iatrogenic related to recent catheterization.  Stomach/Bowel: Normal appearance of the stomach. No pathologic dilatation of small bowel or colon. Normal appendix.  Vascular/Lymphatic: Atherosclerosis throughout the abdominal and pelvic vasculature, without evidence of aneurysm or dissection. Left renal artery and vein are well visualized and are both widely patent. Numerous nonenlarged retroperitoneal lymph nodes are noted, likely reactive. No pathologically enlarged lymph nodes identified in the abdomen or pelvis.  Reproductive: Status post hysterectomy.  Ovaries are atrophic.  Other: No significant volume of ascites.  No pneumoperitoneum.  Musculoskeletal: There are no aggressive appearing lytic or blastic lesions noted in the visualized portions of the skeleton.  IMPRESSION: 1. Interval development of moderate left-sided hydronephrosis and extensive left-sided perinephric stranding, which is likely related to obstructing clot in the left renal collecting system and proximal left ureter. There is generalized hypoperfusion of the  left kidney, which is favored to be related to this obstruction. No frank striated nephrogram is identified at this time to strongly suggest pyelonephritis, although correlation with urinalysis is recommended. 2. No urinary tract calculi. 3. Normal appendix. 4. Additional incidental findings, as above. These results were called by telephone at the time of interpretation on 10/19/2014 at 4:10 pm to Dr. Irine Seal, who verbally acknowledged these results.   Electronically Signed   By: Vinnie Langton M.D.   On: 10/19/2014 16:10   US Renal  10/19/2014   CLINICAL DATA:  Hematuria and left flank pain for 4 days.  EXAM: RENAL/URINARY TRACT ULTRASOUND COMPLETE  COMPARISON:  CT abdomen and pelvis 10/16/2014  FINDINGS: Right Kidney:  Length: 10.1 cm. Echogenicity within normal limits. No mass or hydronephrosis visualized.  Left Kidney:  Length: 11.3 cm. Echogenicity within normal limits. No mass visualized. At most minimal fullness of the intrarenal collecting system without hydronephrosis.  Bladder:  Foley catheter in place with decompressed bladder.  Incidental note is made of increased liver echogenicity, incompletely evaluated but suggestive of mild hepatic steatosis.  IMPRESSION: No hydronephrosis.   Electronically Signed   By: Logan Bores   On: 10/19/2014 11:33   Dg Chest Port 1 View  10/19/2014   CLINICAL DATA:  Left-sided pain  EXAM: PORTABLE CHEST - 1 VIEW  COMPARISON:  10/13/2014  FINDINGS: The heart size and mediastinal contours are within normal limits. Both lungs are clear. The visualized skeletal structures are unremarkable. Postsurgical changes are noted.  IMPRESSION: No active disease.   Electronically Signed   By: Inez Catalina M.D.   On: 10/19/2014 13:32    I independently reviewed the above imaging studies.  Impression/Recommendation: 1) Gross hematuria with left ureteral obstruction:  Her left ureteral obstruction is likely related to clot.  Her pain is currently controlled.  She will  require further evaluation including cystoscopy and left ureteroscopy.  Considering her mechanical heart valve and need for anticoagulation, this may be best performed during her current hospitalization while on IV heparin bridging to minimize time off anticoagulation.  She may require a biopsy if tumor is noted during her endoscopic evaluation which may increase risk of postoperative bleeding on anticoagulation but likely is acceptable considering her indication for anticoagulation.  I have notified Dr. Matilde Sprang of patient's admission.  Once INR is normalized and her cardiac evaluation is completed (echo has been ordered), it would be appropriate to consider proceeding with the above endoscopic procedure.  Treana Lacour,LES 10/19/2014, 7:11 PM  Pryor Curia. MD   CC: Dr. Irine Seal Dr. Nicki Reaper MacDiarmid

## 2014-10-19 NOTE — Progress Notes (Signed)
ANTICOAGULATION CONSULT NOTE - F/u Consult  Pharmacy Consult for heparin Indication: AVR  Allergies  Allergen Reactions  . Atorvastatin     REACTION: myalgias Lipitor  . Codeine     REACTION: N/V  . Morphine And Related Nausea And Vomiting  . Niacin     REACTION: flushing  . Percocet [Oxycodone-Acetaminophen] Other (See Comments)    "blisters in mouth took in 1995"    Patient Measurements: weight 82 kg , height 62 inches  Heparin weight: 68 kg Height: 5\' 2"  (157.5 cm) Weight: 180 lb (81.647 kg) IBW/kg (Calculated) : 50.1  Vital Signs: Temp: 98 F (36.7 C) (03/09 2045) Temp Source: Oral (03/09 2045) BP: 121/63 mmHg (03/09 2045) Pulse Rate: 80 (03/09 2045)  Labs:  Recent Labs  10/19/14 0844 10/19/14 1308 10/19/14 1835  HGB 11.0*  --   --   HCT 33.0*  --   --   PLT 281  --   --   LABPROT 19.7*  --   --   INR 1.66*  --   --   HEPARINUNFRC  --   --  <0.10*  CREATININE 0.94  --   --   TROPONINI  --  <0.03 <0.03    Estimated Creatinine Clearance: 65.4 mL/min (by C-G formula based on Cr of 0.94).   Medical History: Past Medical History  Diagnosis Date  . Hyperlipidemia   . Multinodular thyroid   . GERD (gastroesophageal reflux disease)   . Hx of hysterectomy   . Palpitation   . Aortic stenosis   . Heart murmur     aortic stenosis - dr. Burt Knack is her cardiologist  . Angina     chest tightness and pressure due to aortic stenosis  . Shortness of breath     associated with as  . Diabetes mellitus     newly diagnosed in july, 2012  . Headache(784.0)     migraines when younger  . PONV (postoperative nausea and vomiting)     Medications:  See med rec  Assessment: Patient's a 59 y.o F on coumadin PTA for AVR.  She was recently evaluated at [facility] this past Sunday for UTI and hematuria. She was then discharged with keflex for suspected infection. She now presents to [facility] ED with c/o severe flank pain and hematuria.  Dr. Ralene Bathe stated that hematuria has resolved  and ok to bridge with heparin while INR is subtherapeutic.  INR was 3.35 on 10/16/14.  Patient stated that she did not take her coumadin dose on 3/7 and 3/8. She took 2.5mg  dose this morning PTA. INR today is 1.66.  Cbc ok.  Home regimen: 2.5mg  daily except 5mg  on MWF 3/9  1st HL 1835=<0.10, no problems per RN  Goal of Therapy:  Heparin level 0.3-0.7 units/ml Monitor platelets by anticoagulation protocol: Yes   Plan:   Increase heparin drip to 1450 units/hr  Recheck 6 hour heparin level  monitor cbc and for s/s of bleeding   Lawana Pai R 10/19/2014,10:53 PM

## 2014-10-19 NOTE — ED Provider Notes (Signed)
CSN: [id number]     Arrival date & time 10/19/14  0759 History   First MD Initiated Contact with Patient 10/19/14 0809     Chief Complaint  Patient presents with  . Hematuria  . Flank Pain    left    Patient is a 59 y.o. female presenting with hematuria and flank pain. The history is provided by the patient. No language interpreter was used.  Hematuria  Flank Pain   [name] presents to the ED for evaluation of hematuria.  She was seen in the ED three days ago for hematuria and was diagnosed with UTI. Yesterday she was unable to void/empty her bladder due to clots.  The clot eventually cleared and she could void but continued to pass clots.  At 0330 this morning there was a large amount of blood and clots.  Since that time she is developed a large amount of left flank pain.  Pain is constant.  She denies fevers, vomiting.  She is having nausea.  Sxs are severe, constant, worsening.     Past Medical History  Diagnosis Date  . Hyperlipidemia   . Multinodular thyroid   . GERD (gastroesophageal reflux disease)   . Hx of hysterectomy   . Palpitation   . Aortic stenosis   . Heart murmur     aortic stenosis - dr. Burt Knack is her cardiologist  . Angina     chest tightness and pressure due to aortic stenosis  . Shortness of breath     associated with as  . Diabetes mellitus     newly diagnosed in july, 2012  . Headache(784.0)     migraines when younger  . PONV (postoperative nausea and vomiting)    Past Surgical History  Procedure Laterality Date  . Cesarean section    . Bunionectomy    . Partial hysterectomy    . Abdominal hysterectomy    . Aortic valve replacement  06/25/2011    Procedure: AORTIC VALVE REPLACEMENT (AVR);  Surgeon: Tharon Aquas Adelene Idler, MD;  Location: [place];  Service: Open Heart Surgery;  Laterality: N/A;  . Cardiac valve replacement      Aortic Valve   06/25/2011  . Tee without cardioversion N/A 09/18/2012    Procedure: TRANSESOPHAGEAL ECHOCARDIOGRAM (TEE);   Surgeon: Fay Records, MD;  Location: [facility] ENDOSCOPY;  Service: Cardiovascular;  Laterality: N/A;   Family History  Problem Relation Age of Onset  . Hypertension Other   . Coronary artery disease Father   . COPD Father   . Diabetes Mother   . Cancer Mother 48    bile duct cancer   History  Substance Use Topics  . Smoking status: Former Smoker -- 1.00 packs/day for 15 years    Types: Cigarettes    Quit date: 08/12/1994  . Smokeless tobacco: Never Used  . Alcohol Use: No   OB History    No data available     Review of Systems  Genitourinary: Positive for hematuria and flank pain.  All other systems reviewed and are negative.     Allergies  Atorvastatin; Codeine; Morphine and related; Niacin; and Percocet  Home Medications   Prior to Admission medications   Medication Sig Start Date End Date Taking? Authorizing Provider  acetaminophen (TYLENOL) 325 MG tablet Take 650 mg by mouth every 6 (six) hours as needed for mild pain.    Historical Provider, MD  amoxicillin (AMOXIL) 500 MG capsule Take 4 capsules by mouth one hour prior to appointment. Patient  taking differently: Take 4 capsules by mouth one hour prior to Dental appointments. 09/01/14   Sherren Mocha, MD  aspirin 81 MG tablet Take 81 mg by mouth every morning.     Historical Provider, MD  cephALEXin (KEFLEX) 500 MG capsule Take 1 capsule (500 mg total) by mouth 2 (two) times daily. 10/17/14   Merryl Hacker, MD  cetirizine (ZYRTEC) 10 MG tablet Take 10 mg by mouth daily as needed for allergies.    Historical Provider, MD  CRESTOR 20 MG tablet TAKE 1 TABLET BY MOUTH ONCE A DAY 02/22/13   Biagio Borg, MD  metFORMIN (GLUCOPHAGE) 850 MG tablet Take 1 tablet (850 mg total) by mouth 2 (two) times daily with a meal. Patient taking differently: Take 850 mg by mouth every evening.  03/10/13   Biagio Borg, MD  metoprolol tartrate (LOPRESSOR) 25 MG tablet TAKE ONE TABLET BY MOUTH TWICE DAILY  07/18/14   Sherren Mocha, MD   tolterodine (DETROL LA) 2 MG 24 hr capsule Take 2 mg by mouth daily.    Historical Provider, MD  warfarin (COUMADIN) 5 MG tablet Take as directed by coumadin clinic Patient taking differently: Take 2.5 mg by mouth daily. Take 5mg  on Sunday, Monday, Wednesday and Friday.  All other days take 2.5mg  06/13/14   Sherren Mocha, MD   BP 155/88 mmHg  Pulse 75  Temp(Src) 97.5 F (36.4 C) (Oral)  Resp 22  SpO2 99% Physical Exam  Constitutional: She is oriented to person, place, and time. She appears well-developed and well-nourished. She appears distressed.  HENT:  Head: Normocephalic and atraumatic.  Cardiovascular: Normal rate and regular rhythm.   SEM  Pulmonary/Chest: Effort normal and breath sounds normal. No respiratory distress.  Abdominal: Soft. There is no tenderness. There is no rebound and no guarding.  Musculoskeletal: She exhibits no edema or tenderness.  Neurological: She is alert and oriented to person, place, and time.  Skin: Skin is warm and dry.  Psychiatric: She has a normal mood and affect. Her behavior is normal.  Nursing note and vitals reviewed.   ED Course  Procedures (including critical care time) Labs Review Labs Reviewed  COMPREHENSIVE METABOLIC PANEL - Abnormal; Notable for the following:    Sodium 129 (*)    Glucose, Bld 218 (*)    Calcium 8.2 (*)    GFR calc non Af Amer 66 (*)    GFR calc Af Amer 77 (*)    All other components within normal limits  CBC WITH DIFFERENTIAL/PLATELET - Abnormal; Notable for the following:    WBC 11.6 (*)    RBC 3.68 (*)    Hemoglobin 11.0 (*)    HCT 33.0 (*)    Monocytes Absolute 1.1 (*)    All other components within normal limits  URINALYSIS, ROUTINE W REFLEX MICROSCOPIC - Abnormal; Notable for the following:    Color, Urine AMBER (*)    APPearance TURBID (*)    Hgb urine dipstick LARGE (*)    All other components within normal limits  PROTIME-INR - Abnormal; Notable for the following:    Prothrombin Time 19.7 (*)     INR 1.66 (*)    All other components within normal limits  URINE MICROSCOPIC-ADD ON - Abnormal; Notable for the following:    Crystals URIC ACID CRYSTALS (*)    All other components within normal limits  URINE CULTURE  HEPARIN LEVEL (UNFRACTIONATED)  TROPONIN I  TROPONIN I  TROPONIN I  HEMOGLOBIN A1C  Imaging Review US Renal  10/19/2014   CLINICAL DATA:  Hematuria and left flank pain for 4 days.  EXAM: RENAL/URINARY TRACT ULTRASOUND COMPLETE  COMPARISON:  CT abdomen and pelvis 10/16/2014  FINDINGS: Right Kidney:  Length: 10.1 cm. Echogenicity within normal limits. No mass or hydronephrosis visualized.  Left Kidney:  Length: 11.3 cm. Echogenicity within normal limits. No mass visualized. At most minimal fullness of the intrarenal collecting system without hydronephrosis.  Bladder:  Foley catheter in place with decompressed bladder.  Incidental note is made of increased liver echogenicity, incompletely evaluated but suggestive of mild hepatic steatosis.  IMPRESSION: No hydronephrosis.   Electronically Signed   By: Logan Bores   On: 10/19/2014 11:33     EKG Interpretation   Date/Time:  Wednesday October 19 2014 12:30:14 EST Ventricular Rate:  86 PR Interval:  143 QRS Duration: 78 QT Interval:  456 QTC Calculation: 545 R Axis:   56 Text Interpretation:  Sinus rhythm Ventricular premature complex Low  voltage, precordial leads Nonspecific T abnormalities, lateral leads  Prolonged QT interval Baseline wander in lead(s) II III aVF V2 V6  Confirmed by Hazle Coca [phone]) on 10/19/2014 12:34:36 PM      MDM   Final diagnoses:  Acute flank pain  Acute hyponatremia    Pt with hx/o aortic valve replacement on chronic coumadin therapy here for evaluation of left flank pain and hematuria.  Current UA not c/w UTI, recent cultures with no significant growth.   Foley catheter placed in the emergency department with return of brownish tinged yellow urine. There are no obstructing clots.  Foley  catheter DC'd. Renal ultrasound obtained to evaluate for evidence of hydronephrosis, renal ultrasound was unremarkable. Discussed the patient with Dr. Loletha Grayer with urology who recommends routine follow-up with Dr. Drue Novel as originally scheduled for next week. Patient does have drop in her hemoglobin from prior ED visit 3 days ago with new hyponatremia and subtherapeutic INR with mechanical heart valve. Bridging heparin therapy started and medicine consultation for admission and ongoing flank pain with hyponatremia and subtherapeutic INR.    Quintella Reichert, MD 10/19/14 1331

## 2014-10-19 NOTE — ED Notes (Signed)
Pt was seen at [facility] on Sunday for UTI.  Pt stated she saw urologist on Monday. Pt having blood clots in her urine and some have been obstructing. Pt been in pain since yesterday. Pt called urology office yesterday but hasnt heard back from them. So since she woke up this morning with severe left flank pain and hasnt urinated since 4am she came on to hospital.

## 2014-10-19 NOTE — ED Notes (Signed)
Ultrasound at bedside

## 2014-10-19 NOTE — ED Notes (Addendum)
Spoke to [name] 20 min time of arrival..klj 16:05 can go.

## 2014-10-19 NOTE — Progress Notes (Signed)
ANTICOAGULATION CONSULT NOTE - Initial Consult  Pharmacy Consult for heparin Indication: AVR  Allergies  Allergen Reactions  . Atorvastatin     REACTION: myalgias Lipitor  . Codeine     REACTION: N/V  . Morphine And Related Nausea And Vomiting  . Niacin     REACTION: flushing  . Percocet [Oxycodone-Acetaminophen] Other (See Comments)    "blisters in mouth took in 1995"    Patient Measurements: weight 82 kg , height 62 inches  Heparin weight: 68 kg    Vital Signs: Temp: 98.9 F (37.2 C) (03/09 1047) Temp Source: Oral (03/09 1047) BP: 145/68 mmHg (03/09 1047) Pulse Rate: 76 (03/09 1047)  Labs:  Recent Labs  10/16/14 2158 10/19/14 0844  HGB 13.5 11.0*  HCT 40.8 33.0*  PLT 301 281  LABPROT 34.2* 19.7*  INR 3.35* 1.66*  CREATININE 0.78 0.94    CrCl cannot be calculated (Unknown ideal weight.).   Medical History: Past Medical History  Diagnosis Date  . Hyperlipidemia   . Multinodular thyroid   . GERD (gastroesophageal reflux disease)   . Hx of hysterectomy   . Palpitation   . Aortic stenosis   . Heart murmur     aortic stenosis - dr. Burt Knack is her cardiologist  . Angina     chest tightness and pressure due to aortic stenosis  . Shortness of breath     associated with as  . Diabetes mellitus     newly diagnosed in july, 2012  . Headache(784.0)     migraines when younger  . PONV (postoperative nausea and vomiting)     Medications:  See med rec  Assessment: Patient's a 59 y.o F on coumadin PTA for AVR.  She was recently evaluated at [facility] this past Sunday for UTI and hematuria. She was then discharged with keflex for suspected infection. She now presents to [facility] ED with c/o severe flank pain and hematuria.  Dr. Ralene Bathe stated that hematuria has resolved and ok to bridge with heparin while INR is subtherapeutic.  INR was 3.35 on 10/16/14.  Patient stated that she did not take her coumadin dose on 3/7 and 3/8. She took 2.5mg  dose this morning PTA. INR today is  1.66.  Cbc ok.  Home regimen: 2.5mg  daily except 5mg  on MWF  Goal of Therapy:  Heparin level 0.3-0.7 units/ml Monitor platelets by anticoagulation protocol: Yes   Plan:  - start heparin drip at 1100 units/hr (no bolus) - check 6 hour heparin level - monitor cbc and for s/s of bleeding   [name], [name] P 10/19/2014,12:03 PM

## 2014-10-19 NOTE — ED Notes (Signed)
Bladder scan done by Dr Ralene Bathe.

## 2014-10-19 NOTE — H&P (Signed)
[facility] History and Physical  [name] GQB:[id number] DOB: 11/19/1955 DOA: 10/19/2014  Referring physician: Dr. Ralene Bathe PCP: Nena Jordan, Mammie Lorenzo, MD   Chief Complaint: Left-sided flank pain/hematuria  HPI: [name] is a 60 y.o. female  With history of aortic stenosis status post aortic valve replacement 06/25/2011 on chronic anticoagulation therapy, gastroesophageal reflux disease, hyperlipidemia, diabetes mellitus who presents to the ED with sudden onset left flank pain. Patient states 3 days prior to admission she noticed hematuria and was passing some clots in her urine. Patient stated she subsequently presented to the ED where urinalysis was done was consistent with a UTI patient was given some IV Rocephin and discharged home on Keflex. Patient stated that 2 days prior to admission she noticed she had worsening hematuria subsequently went to see her PCP and she was referred to urology. Patient stated that she was seen by urologist and was recommended to continue the antibiotic until culture results returned. Patient states 1 day prior to admission she noticed that her clots from her urine were getting worse and subsequently improved after an hour. Patient states she awoke on the morning of admission around 3:30 AM and noticed the clots in the urine. Patient stated that around 432 5 AM had significant left-sided flank pain which was stabbing in nature constant and subsequently presented to the ED. Patient rates her pain is a 20 out of a 10. Patient denies any fevers, no chills. No diarrhea, no constipation, no dysuria, no shortness of breath. Patient states now was supratherapeutic and a such had held her Coumadin however forgot to take it yesterday. Patient also with some complaints of substernal chest pain described as a pressure lasting 3-4 minutes which she states has been FOR SEVERAL YEARS AND ATTRIBUTES IT TO HER AORTIC VALVE. Patient was seen in the emergency room  fully catheter was placed and hematuria seemed 12 cleared up. Compressive metabolic profile was done had a sodium of 129 glucose of 218 otherwise was within normal limits. CBC done had a white count of 11.6 hemoglobin of 11.0 otherwise was within normal limits. INR was 1.66. Urinalysis done was nitrite negative leukocytes negative with large hemoglobin, 21-50 RBCs. Chest x-ray done was negative. Patient was given some IV pain medication emergency room. [facility] were consulted to admit the patient for further evaluation and management.   Review of Systems: As per history of present illness otherwise negative. Constitutional:  No weight loss, night sweats, Fevers, chills, fatigue.  HEENT:  No headaches, Difficulty swallowing,Tooth/dental problems,Sore throat,  No sneezing, itching, ear ache, nasal congestion, post nasal drip,  Cardio-vascular:  No chest pain, Orthopnea, PND, swelling in lower extremities, anasarca, dizziness, palpitations  GI:  No heartburn, indigestion, abdominal pain, nausea, vomiting, diarrhea, change in bowel habits, loss of appetite  Resp:  No shortness of breath with exertion or at rest. No excess mucus, no productive cough, No non-productive cough, No coughing up of blood.No change in color of mucus.No wheezing.No chest wall deformity  Skin:  no rash or lesions.  GU:  no dysuria, change in color of urine, no urgency or frequency. No flank pain.  Musculoskeletal:  No joint pain or swelling. No decreased range of motion. No back pain.  Psych:  No change in mood or affect. No depression or anxiety. No memory loss.   Past Medical History  Diagnosis Date  . Hyperlipidemia   . Multinodular thyroid   . GERD (gastroesophageal reflux disease)   . Hx of hysterectomy   .  Palpitation   . Aortic stenosis   . Heart murmur     aortic stenosis - dr. Burt Knack is her cardiologist  . Angina     chest tightness and pressure due to aortic stenosis  . Shortness of breath      associated with as  . Diabetes mellitus     newly diagnosed in july, 2012  . Headache(784.0)     migraines when younger  . PONV (postoperative nausea and vomiting)    Past Surgical History  Procedure Laterality Date  . Cesarean section    . Bunionectomy    . Partial hysterectomy    . Abdominal hysterectomy    . Aortic valve replacement  06/25/2011    Procedure: AORTIC VALVE REPLACEMENT (AVR);  Surgeon: Tharon Aquas Adelene Idler, MD;  Location: [place];  Service: Open Heart Surgery;  Laterality: N/A;  . Cardiac valve replacement      Aortic Valve   06/25/2011  . Tee without cardioversion N/A 09/18/2012    Procedure: TRANSESOPHAGEAL ECHOCARDIOGRAM (TEE);  Surgeon: Fay Records, MD;  Location: [facility] ENDOSCOPY;  Service: Cardiovascular;  Laterality: N/A;   Social History:  reports that she quit smoking about 20 years ago. Her smoking use included Cigarettes. She has a 15 pack-year smoking history. She has never used smokeless tobacco. She reports that she does not drink alcohol or use illicit drugs.  Allergies  Allergen Reactions  . Atorvastatin     REACTION: myalgias Lipitor  . Codeine     REACTION: N/V  . Morphine And Related Nausea And Vomiting  . Niacin     REACTION: flushing  . Percocet [Oxycodone-Acetaminophen] Other (See Comments)    "blisters in mouth took in 1995"    Family History  Problem Relation Age of Onset  . Hypertension Other   . Coronary artery disease Father   . COPD Father   . Diabetes Mother   . Cancer Mother 36    bile duct cancer     Prior to Admission medications   Medication Sig Start Date End Date Taking? Authorizing Provider  acetaminophen (TYLENOL) 325 MG tablet Take 650 mg by mouth every 6 (six) hours as needed for mild pain.   Yes Historical Provider, MD  aspirin 81 MG tablet Take 81 mg by mouth every morning.    Yes Historical Provider, MD  cephALEXin (KEFLEX) 500 MG capsule Take 1 capsule (500 mg total) by mouth 2 (two) times daily. 10/17/14  Yes  Merryl Hacker, MD  cetirizine (ZYRTEC) 10 MG tablet Take 10 mg by mouth daily as needed for allergies.   Yes Historical Provider, MD  CRESTOR 20 MG tablet TAKE 1 TABLET BY MOUTH ONCE A DAY Patient taking differently: Take 1 tablet by mouth once a day. 02/22/13  Yes Biagio Borg, MD  metFORMIN (GLUCOPHAGE) 850 MG tablet Take 1 tablet (850 mg total) by mouth 2 (two) times daily with a meal. Patient taking differently: Take 850 mg by mouth every evening.  03/10/13  Yes Biagio Borg, MD  metoprolol tartrate (LOPRESSOR) 25 MG tablet TAKE ONE TABLET BY MOUTH TWICE DAILY  Patient taking differently: Take 1 tablet by mouth twice daily. 07/18/14  Yes Sherren Mocha, MD  tolterodine (DETROL LA) 2 MG 24 hr capsule Take 2 mg by mouth daily.   Yes Historical Provider, MD  warfarin (COUMADIN) 5 MG tablet Take as directed by coumadin clinic Patient taking differently: Take 2.5-5 mg by mouth daily. Take 5mg  on  Monday, Wednesday and  Friday.  All other days take 2.5mg  06/13/14  Yes Sherren Mocha, MD  amoxicillin (AMOXIL) 500 MG capsule Take 4 capsules by mouth one hour prior to appointment. Patient taking differently: Take 4 capsules by mouth one hour prior to Dental appointments. 09/01/14   Sherren Mocha, MD   Physical Exam: Filed Vitals:   10/19/14 0809 10/19/14 1047 10/19/14 1225  BP: 155/88 145/68   Pulse: 75 76   Temp: 97.5 F (36.4 C) 98.9 F (37.2 C)   TempSrc: Oral Oral   Resp: 22 19   Weight:   81.647 kg (180 lb)  SpO2: 99% 92%     Wt Readings from Last 3 Encounters:  10/19/14 81.647 kg (180 lb)  01/26/14 81.534 kg (179 lb 12 oz)  07/25/13 78.926 kg (174 lb)    General:  Well-developed well-nourished in no acute cardiopulmonary distress.  Eyes: PERRLA, EOMI, normal lids, irises & conjunctiva ENT: grossly normal hearing, lips & tongue. Dry mucous membranes Neck: no LAD, masses or thyromegaly Cardiovascular: RRR, no m/r/g. No LE edema. Respiratory: CTA bilaterally, no w/r/r. Normal  respiratory effort. Abdomen: soft, ntnd, positive bowel sounds, no rebound, no guarding Skin: no rash or induration seen on limited exam Musculoskeletal: grossly normal tone BUE/BLE Psychiatric: grossly normal mood and affect, speech fluent and appropriate Neurologic: Alert and oriented 3. Cranial nerves II through XII are grossly intact. No focal deficits.           Labs on Admission:  Basic Metabolic Panel:  Recent Labs Lab 10/16/14 2158 10/19/14 0844  NA 138 129*  K 3.7 3.9  CL 105 99  CO2 26 22  GLUCOSE 143* 218*  BUN 11 14  CREATININE 0.78 0.94  CALCIUM 9.1 8.2*   Liver Function Tests:  Recent Labs Lab 10/19/14 0844  AST 26  ALT 15  ALKPHOS 75  BILITOT 0.5  PROT 7.0  ALBUMIN 3.9   No results for input(s): LIPASE, AMYLASE in the last 168 hours. No results for input(s): AMMONIA in the last 168 hours. CBC:  Recent Labs Lab 10/16/14 2158 10/19/14 0844  WBC 11.8* 11.6*  NEUTROABS 5.6 7.6  HGB 13.5 11.0*  HCT 40.8 33.0*  MCV 87.9 89.7  PLT 301 281   Cardiac Enzymes: No results for input(s): CKTOTAL, CKMB, CKMBINDEX, TROPONINI in the last 168 hours.  BNP (last 3 results) No results for input(s): BNP in the last 8760 hours.  ProBNP (last 3 results) No results for input(s): PROBNP in the last 8760 hours.  CBG: No results for input(s): GLUCAP in the last 168 hours.  Radiological Exams on Admission: US Renal  10/19/2014   CLINICAL DATA:  Hematuria and left flank pain for 4 days.  EXAM: RENAL/URINARY TRACT ULTRASOUND COMPLETE  COMPARISON:  CT abdomen and pelvis 10/16/2014  FINDINGS: Right Kidney:  Length: 10.1 cm. Echogenicity within normal limits. No mass or hydronephrosis visualized.  Left Kidney:  Length: 11.3 cm. Echogenicity within normal limits. No mass visualized. At most minimal fullness of the intrarenal collecting system without hydronephrosis.  Bladder:  Foley catheter in place with decompressed bladder.  Incidental note is made of increased liver  echogenicity, incompletely evaluated but suggestive of mild hepatic steatosis.  IMPRESSION: No hydronephrosis.   Electronically Signed   By: Logan Bores   On: 10/19/2014 11:33    EKG: None  Assessment/Plan Principal Problem:   Acute left flank pain Active Problems:   Anxiety state   Aortic valve disorder   GERD   S/P AVR (aortic valve  replacement)   Chest pain   UTI (lower urinary tract infection)   Hyponatremia   Anemia   Acute blood loss anemia  #1 acute left flank pain Questionable etiology. Patient with recent hematuria which has since cleared up today. Patient with recent urinalysis consistent with a UTI on antibiotic therapy. Urine cultures which were done were negative. Consent for possible pyelonephritis versus other intra-abdominal pathology. Patient was on chronic anticoagulation INR was supratherapeutic 3 days prior to admission and currently subtherapeutic. We'll get a CT of the abdomen and pelvis with and without contrast for further evaluation and management. IV fluids, pain management, supportive care.  #2 urinary tract infection Patient was on Keflex as outpatient have a urine cultures have been negative. We'll place on IV Rocephin.  #3 hyponatremia Likely secondary to hypovolemic hyponatremia. Will hydrate with IV fluids and follow.  #4 acute blood loss anemia/anemia Likely secondary to recent hematuria. Hemoglobin currently stable. Follow H&H.  #5 chest pain Patient states this chest pain she is having is more chronic in nature and has been ongoing for several years secondary to her valve. Patient is status post aortic valve replacement. INR currently on admission is subtherapeutic. Will check a EKG. Cycle cardiac enzymes. Check a 2-D echo. Place on IV heparin for now until INR is therapeutic. Follow.  #6 status post aortic valve replacement Placed on IV heparin.  #7 gastroesophageal reflux disease PPI.  #8 prophylaxis PPI for GI prophylaxis. Heparin for  DVT prophylaxis.    Code Status: Full DVT Prophylaxis:heparin Family Communication: Updated patient and family at bedside. Disposition Plan: Admit to telemetry  Time spent: 70 mins  [facility] MD [facility] Pager [phone]  Addendum: 10/19/2014 1610 hrs. Was contacted by radiologist about findings noted on CT abdomen and pelvis showing interval development of moderate left-sided hydronephrosis and extensive left-sided perinephric stranding which is likely related to obstructing clot in the left renal collecting system and proximal left ureter. There is generalized hypoperfusion of the left kidney which is favored to be related to this obstruction. No urinary tract calculi. Normal appendix.  Spoke with attending urologist Dr. Alinda Money who will be seeing patient in consultation for further evaluation and management. Will make patient nothing by mouth for now until assessed by urology. Follow.

## 2014-10-20 ENCOUNTER — Other Ambulatory Visit: Payer: Self-pay | Admitting: Urology

## 2014-10-20 DIAGNOSIS — R079 Chest pain, unspecified: Secondary | ICD-10-CM

## 2014-10-20 LAB — HEMOGLOBIN A1C
Hgb A1c MFr Bld: 7.6 % — ABNORMAL HIGH (ref 4.8–5.6)
MEAN PLASMA GLUCOSE: 171 mg/dL

## 2014-10-20 LAB — BASIC METABOLIC PANEL
ANION GAP: 9 (ref 5–15)
BUN: 10 mg/dL (ref 6–23)
CHLORIDE: 104 mmol/L (ref 96–112)
CO2: 23 mmol/L (ref 19–32)
Calcium: 8.4 mg/dL (ref 8.4–10.5)
Creatinine, Ser: 1.14 mg/dL — ABNORMAL HIGH (ref 0.50–1.10)
GFR calc non Af Amer: 52 mL/min — ABNORMAL LOW (ref >90)
GFR, EST AFRICAN AMERICAN: 61 mL/min — AB (ref >90)
Glucose, Bld: 132 mg/dL — ABNORMAL HIGH (ref 70–99)
Potassium: 4 mmol/L (ref 3.5–5.1)
Sodium: 136 mmol/L (ref 135–145)

## 2014-10-20 LAB — PROTIME-INR
INR: 1.55 — AB (ref 0.00–1.49)
PROTHROMBIN TIME: 18.7 s — AB (ref 11.6–15.2)

## 2014-10-20 LAB — CBC
HEMATOCRIT: 33.8 % — AB (ref 36.0–46.0)
HEMOGLOBIN: 11.3 g/dL — AB (ref 12.0–15.0)
MCH: 29.7 pg (ref 26.0–34.0)
MCHC: 33.4 g/dL (ref 30.0–36.0)
MCV: 88.9 fL (ref 78.0–100.0)
PLATELETS: 266 10*3/uL (ref 150–400)
RBC: 3.8 MIL/uL — ABNORMAL LOW (ref 3.87–5.11)
RDW: 13.7 % (ref 11.5–15.5)
WBC: 15.7 10*3/uL — AB (ref 4.0–10.5)

## 2014-10-20 LAB — GLUCOSE, CAPILLARY
GLUCOSE-CAPILLARY: 136 mg/dL — AB (ref 70–99)
Glucose-Capillary: 129 mg/dL — ABNORMAL HIGH (ref 70–99)
Glucose-Capillary: 139 mg/dL — ABNORMAL HIGH (ref 70–99)
Glucose-Capillary: 140 mg/dL — ABNORMAL HIGH (ref 70–99)

## 2014-10-20 LAB — TROPONIN I

## 2014-10-20 LAB — HEPARIN LEVEL (UNFRACTIONATED)
Heparin Unfractionated: 0.45 IU/mL (ref 0.30–0.70)
Heparin Unfractionated: 0.3 IU/mL (ref 0.30–0.70)

## 2014-10-20 MED ORDER — HEPARIN (PORCINE) IN NACL 100-0.45 UNIT/ML-% IJ SOLN
1500.0000 [IU]/h | INTRAMUSCULAR | Status: DC
  Administered 2014-10-21: 1500 [IU]/h via INTRAVENOUS
  Filled 2014-10-20 (×2): qty 250

## 2014-10-20 MED ORDER — KETOROLAC TROMETHAMINE 30 MG/ML IJ SOLN
30.0000 mg | Freq: Four times a day (QID) | INTRAMUSCULAR | Status: AC | PRN
Start: 2014-10-20 — End: 2014-10-25

## 2014-10-20 NOTE — Evaluation (Signed)
Physical Therapy Evaluation Patient Details Name: MARCIE SHEARON MRN: [id number] DOB: July 22, 1956 Today's Date: 10/20/2014   History of Present Illness  59 yo female admitted with L flank pain, hematuria, L ureteral obstruction, N/V.   Clinical Impression  On eval, pt was Min guard assist for mobility-able to ambulate ~75'x2. Seated rest break needed due to pt report of moderate dizziness. Pt was able to ambulate back to room. Pt reports ongoing nausea and vomiting when she mobilizes. Encouraged pt to walk in hallways with family or nursing supervision, as able/tolerated. Do not anticipate any f/u PT needs at discharge. Will follow.     Follow Up Recommendations No PT follow up;Supervision for mobility/OOB (initially)    Equipment Recommendations  None recommended by PT    Recommendations for Other Services       Precautions / Restrictions Precautions Precautions: Fall Precaution Comments: dizziness with ambulation Restrictions Weight Bearing Restrictions: No      Mobility  Bed Mobility Overal bed mobility: Modified Independent                Transfers Overall transfer level: Modified independent                  Ambulation/Gait Ambulation/Gait assistance: Min guard Ambulation Distance (Feet): 75 Feet (x2) Assistive device: None Gait Pattern/deviations: Step-through pattern     General Gait Details: slightly unsteady at times. Pt reported moderate dizziness with ambulation so took seated rest break midway.   Stairs            Wheelchair Mobility    Modified Rankin (Stroke Patients Only)       Balance Overall balance assessment: Needs assistance         Standing balance support: No upper extremity supported;During functional activity Standing balance-Leahy Scale: Good                               Pertinent Vitals/Pain Pain Assessment: No/denies pain    Home Living Family/patient expects to be discharged to:: Private  residence Living Arrangements: Spouse/significant other Available Help at Discharge: Family Type of Home: House Home Access: Stairs to enter     Home Layout: Laundry or work area in [employer]: None      Prior Function Level of Independence: Independent               Journalist, newspaper        Extremity/Trunk Assessment   Upper Extremity Assessment: Overall WFL for tasks assessed           Lower Extremity Assessment: Generalized weakness      Cervical / Trunk Assessment: Normal  Communication   Communication: No difficulties  Cognition Arousal/Alertness: Awake/alert Behavior During Therapy: WFL for tasks assessed/performed Overall Cognitive Status: Within Functional Limits for tasks assessed                      General Comments      Exercises        Assessment/Plan    PT Assessment Patient needs continued PT services  PT Diagnosis Difficulty walking;Generalized weakness   PT Problem List Decreased activity tolerance;Decreased mobility  PT Treatment Interventions Gait training;Functional mobility training;Therapeutic activities;Therapeutic exercise;Patient/family education   PT Goals (Current goals can be found in the Care Plan section) Acute Rehab PT Goals Patient Stated Goal: to feel better PT Goal Formulation: With patient Time For Goal Achievement: 11/03/14 Potential to Achieve Goals:  Good    Frequency Min 3X/week   Barriers to discharge        Co-evaluation               End of Session   Activity Tolerance: Patient tolerated treatment well Patient left: in bed;with call bell/phone within reach;with family/visitor present           Time: 1594-5859 PT Time Calculation (min) (ACUTE ONLY): 11 min   Charges:   PT Evaluation $Initial PT Evaluation Tier I: 1 Procedure     PT G Codes:        Weston Anna, MPT Pager: [phone]

## 2014-10-20 NOTE — Progress Notes (Signed)
Pt very nauseated, pt refusing to take her PO nightly medications. Educated on what medications she had, pt states "I'll be fine for one night without them." NP on call paged about pt refusing her metoprolol, no new orders received. Will continue to monitor.

## 2014-10-20 NOTE — Progress Notes (Signed)
Echocardiogram 2D Echocardiogram has been performed.  [name] 10/20/2014, 9:45 AM

## 2014-10-20 NOTE — Progress Notes (Signed)
ANTICOAGULATION CONSULT NOTE - Follow up  Pharmacy Consult for heparin Indication: AVR  Allergies  Allergen Reactions  . Atorvastatin     REACTION: myalgias Lipitor  . Codeine     REACTION: N/V  . Morphine And Related Nausea And Vomiting  . Niacin     REACTION: flushing  . Percocet [Oxycodone-Acetaminophen] Other (See Comments)    "blisters in mouth took in 1995"    Patient Measurements: weight 82 kg , height 62 inches  Heparin weight: 68 kg Height: 5\' 2"  (157.5 cm) Weight: 192 lb 0.3 oz (87.1 kg) IBW/kg (Calculated) : 50.1  Vital Signs: Temp: 98 F (36.7 C) (03/10 0446) Temp Source: Oral (03/10 0446) BP: 117/66 mmHg (03/10 0446) Pulse Rate: 84 (03/10 0446)  Labs:  Recent Labs  10/19/14 0844 10/19/14 1308 10/19/14 1835 10/20/14 0214 10/20/14 0216  HGB 11.0*  --   --  11.3*  --   HCT 33.0*  --   --  33.8*  --   PLT 281  --   --  266  --   LABPROT 19.7*  --   --  18.7*  --   INR 1.66*  --   --  1.55*  --   HEPARINUNFRC  --   --  <0.10*  --  0.45  CREATININE 0.94  --   --  1.14*  --   TROPONINI  --  <0.03 <0.03  --  <0.03    Estimated Creatinine Clearance: 55.8 mL/min (by C-G formula based on Cr of 1.14).  Assessment: Patient's a 59 y.o F on coumadin PTA for AVR.  She was recently evaluated at [facility] this past Sunday for UTI and hematuria. She was then discharged with keflex for suspected infection. She now presents to [facility] ED with c/o severe flank pain and hematuria.  Dr. Ralene Bathe stated that hematuria has resolved and ok to bridge with heparin while INR is subtherapeutic.  INR was 3.35 on 10/16/14.  Patient stated that she did not take her coumadin dose on 3/7 and 3/8. She took 2.5mg  dose this morning PTA. INR today is 1.66.  Cbc ok.  Home regimen: 2.5mg  daily except 5mg  on MWF  Significant events: 3/9: heparin started, first HL <0.1 on 1100units/hr, increased to 1450units/hr.    Today, 10/20/2014:  HL 0.45 but drawn only ~2.5 hours after the rate change.  AM  labs were unable to be drawn and a PICC line was ordered.   Heparin infusion has been uninterrupted and there has been no bleeding reported/documented.  Goal of Therapy:  Heparin level 0.3-0.7 units/ml Monitor platelets by anticoagulation protocol: Yes   Plan:   F/u Heparin level once drawn STAT from the PICC.  Monitor cbc and for s/s of bleeding  Romeo Rabon, PharmD, pager [phone]. 10/20/2014,2:45 PM.

## 2014-10-20 NOTE — Progress Notes (Signed)
[facility] PROGRESS NOTE  [name] LEX:[id number] DOB: Jan 17, 1956 DOA: 10/19/2014 PCP: Ileana Roup, MD  Assessment/Plan: #1 left flank pain/gross hematuria/hydronephrosis CT of the abdomen and pelvis with left renal pelvic clot versus possible urothelial mass. Patient's gross hematuria improving. Patient has been seen by urology and patient likely will undergo cystoscopy, left retrograde, left ureteroscopy, possible biopsy tomorrow. Patient currently on heparin drip. Coumadin on hold. Urology following and appreciate input and recommendations.  #2 urinary tract infection Patient was on Keflex as outpatient. Urine cultures at that time were negative. Continue IV Rocephin.  #3 hyponatremia Likely secondary to hypovolemic hyponatremia. Improved with hydration. Follow.  #4 chest pain Patient denies any further chest pain. Patient states this is her chronic chest pain. Cardiac enzymes negative 3. 2-D echo with EF of 60-65% with no wall motion abnormalities. Aortic valve with moderate stenosis. Continue IV heparin for now in anticipation of probable procedure tomorrow. No further cardiac workup needed at this time.  #5 acute blood loss anemia/anemia Likely secondary to gross hematuria. H&H stable. Follow.  #6 status post aortic valve replacement Currently on IV heparin. Coumadin on hold in anticipation of upper procedure tomorrow.  #7 gastroesophageal reflux disease PPI.  #8 prophylaxis PPI for GI prophylaxis. Heparin for DVT prophylaxis.  Code Status: Full Family Communication: Updated patient and husband at bedside. Disposition Plan: Remain inpatient.   Consultants:  Urology: Dr. Alinda Money 10/19/2014  Procedures:  CT abdomen and pelvis 10/19/2014  Renal ultrasound 10/19/2014  Chest x-ray 10/19/2014  2-D echo 10/20/2014  Antibiotics:  IV Rocephin 10/19/2014    HPI/Subjective: Patient complaining of left flank pain. Patient asking to be seen by her  cardiologist. Patient states had a little bit of blood in the urine early on today.  Objective: Filed Vitals:   10/20/14 1505  BP: 109/56  Pulse: 73  Temp: 98.3 F (36.8 C)  Resp: 19    Intake/Output Summary (Last 24 hours) at 10/20/14 1805 Last data filed at 10/20/14 1500  Gross per 24 hour  Intake 2349.1 ml  Output    990 ml  Net 1359.1 ml   Filed Weights   10/19/14 1225 10/20/14 0446  Weight: 81.647 kg (180 lb) 87.1 kg (192 lb 0.3 oz)    Exam:   General:  NAD  Cardiovascular: RRR  Respiratory: CTAB  Abdomen: Soft, nondistended, positive bowel sounds. Left CVA tenderness to palpation.  Musculoskeletal: No clubbing cyanosis or edema.  Data Reviewed: Basic Metabolic Panel:  Recent Labs Lab 10/16/14 2158 10/19/14 0844 10/19/14 1308 10/20/14 0214  NA 138 129*  --  136  K 3.7 3.9  --  4.0  CL 105 99  --  104  CO2 26 22  --  23  GLUCOSE 143* 218*  --  132*  BUN 11 14  --  10  CREATININE 0.78 0.94  --  1.14*  CALCIUM 9.1 8.2*  --  8.4  MG  --   --  1.8  --    Liver Function Tests:  Recent Labs Lab 10/19/14 0844  AST 26  ALT 15  ALKPHOS 75  BILITOT 0.5  PROT 7.0  ALBUMIN 3.9   No results for input(s): LIPASE, AMYLASE in the last 168 hours. No results for input(s): AMMONIA in the last 168 hours. CBC:  Recent Labs Lab 10/16/14 2158 10/19/14 0844 10/20/14 0214  WBC 11.8* 11.6* 15.7*  NEUTROABS 5.6 7.6  --   HGB 13.5 11.0* 11.3*  HCT 40.8 33.0* 33.8*  MCV 87.9  89.7 88.9  PLT 301 281 266   Cardiac Enzymes:  Recent Labs Lab 10/19/14 1308 10/19/14 1835 10/20/14 0216  TROPONINI <0.03 <0.03 <0.03   BNP (last 3 results) No results for input(s): BNP in the last 8760 hours.  ProBNP (last 3 results) No results for input(s): PROBNP in the last 8760 hours.  CBG:  Recent Labs Lab 10/19/14 1749 10/19/14 2054 10/20/14 0743 10/20/14 1144 10/20/14 1655  GLUCAP 143* 114* 136* 129* 139*    Recent Results (from the past 240 hour(s))   Urine culture     Status: None   Collection Time: 10/16/14 10:00 PM  Result Value Ref Range Status   Specimen Description URINE, RANDOM  Final   Special Requests NONE  Final   Colony Count   Final    2,000 COLONIES/ML Performed at [facility]    Culture   Final    INSIGNIFICANT GROWTH Performed at [facility]    Report Status 10/18/2014 FINAL  Final     Studies: Ct Abdomen Pelvis W Wo Contrast  10/19/2014   CLINICAL DATA:  59 year old female with recent history of urinary tract infection, recently passing blood clots in her urine. Abdominal pain since yesterday. Severe left flank pain since this morning. Unable to urinate.  EXAM: CT ABDOMEN AND PELVIS WITHOUT AND WITH CONTRAST  TECHNIQUE: Multidetector CT imaging of the abdomen and pelvis was performed following the standard protocol before and following the bolus administration of intravenous contrast.  CONTRAST:  14mL OMNIPAQUE IOHEXOL 300 MG/ML  SOLN  COMPARISON:  CT of the abdomen and pelvis 10/16/2014.  FINDINGS: Lower chest: Mild dependent atelectasis in the lower lobes of the lungs bilaterally (left greater than right). Mechanical aortic valve. Status post median sternotomy.  Hepatobiliary: Mild diffuse hepatic steatosis, with area of more significant focal fatty infiltration in the central aspect of segment 4B of the liver, and adjacent to the falciform ligament anteriorly in segment 4B, similar to the prior examination. There is an additional hypovascular lesion measuring 1 cm in the periphery of segment 6 (image 30 of series 6), which is unchanged in size compared to prior study 04/15/2012, and although technically indeterminate, considered benign. No other suspicious appearing hepatic lesions are noted. No intra or extrahepatic biliary ductal dilatation. Gallbladder is normal in appearance.  Pancreas: Unremarkable.  Spleen: Unremarkable.  Adrenals/Urinary Tract: No calcifications are identified within the collecting  system of either kidney, along the course of either ureter, or within the lumen of the urinary bladder. Right kidney is normal in appearance. New moderate left-sided hydronephrosis. Notably, the left renal pelvis, most proximal aspect of the left ureter, and the lower pole collecting system of the left kidney contain high attenuation material (45 Hounsfield units), favored to represent blood. Extensive left-sided perinephric stranding. Generalized hypoperfusion throughout the renal parenchyma of the left kidney as compared to the right, without frank striated nephrogram. Delayed images demonstrate no significant excretion of contrast material by the left kidney. Normal right-sided excretion is noted. Urinary bladder is generally normal in appearance, with exception of a nondependent locule of gas, presumably iatrogenic related to recent catheterization.  Stomach/Bowel: Normal appearance of the stomach. No pathologic dilatation of small bowel or colon. Normal appendix.  Vascular/Lymphatic: Atherosclerosis throughout the abdominal and pelvic vasculature, without evidence of aneurysm or dissection. Left renal artery and vein are well visualized and are both widely patent. Numerous nonenlarged retroperitoneal lymph nodes are noted, likely reactive. No pathologically enlarged lymph nodes identified in the abdomen or pelvis.  Reproductive: Status post hysterectomy.  Ovaries are atrophic.  Other: No significant volume of ascites.  No pneumoperitoneum.  Musculoskeletal: There are no aggressive appearing lytic or blastic lesions noted in the visualized portions of the skeleton.  IMPRESSION: 1. Interval development of moderate left-sided hydronephrosis and extensive left-sided perinephric stranding, which is likely related to obstructing clot in the left renal collecting system and proximal left ureter. There is generalized hypoperfusion of the left kidney, which is favored to be related to this obstruction. No frank striated  nephrogram is identified at this time to strongly suggest pyelonephritis, although correlation with urinalysis is recommended. 2. No urinary tract calculi. 3. Normal appendix. 4. Additional incidental findings, as above. These results were called by telephone at the time of interpretation on 10/19/2014 at 4:10 pm to Dr. Irine Seal, who verbally acknowledged these results.   Electronically Signed   By: Vinnie Langton M.D.   On: 10/19/2014 16:10   US Renal  10/19/2014   CLINICAL DATA:  Hematuria and left flank pain for 4 days.  EXAM: RENAL/URINARY TRACT ULTRASOUND COMPLETE  COMPARISON:  CT abdomen and pelvis 10/16/2014  FINDINGS: Right Kidney:  Length: 10.1 cm. Echogenicity within normal limits. No mass or hydronephrosis visualized.  Left Kidney:  Length: 11.3 cm. Echogenicity within normal limits. No mass visualized. At most minimal fullness of the intrarenal collecting system without hydronephrosis.  Bladder:  Foley catheter in place with decompressed bladder.  Incidental note is made of increased liver echogenicity, incompletely evaluated but suggestive of mild hepatic steatosis.  IMPRESSION: No hydronephrosis.   Electronically Signed   By: Logan Bores   On: 10/19/2014 11:33   Dg Chest Port 1 View  10/19/2014   CLINICAL DATA:  Left-sided pain  EXAM: PORTABLE CHEST - 1 VIEW  COMPARISON:  10/13/2014  FINDINGS: The heart size and mediastinal contours are within normal limits. Both lungs are clear. The visualized skeletal structures are unremarkable. Postsurgical changes are noted.  IMPRESSION: No active disease.   Electronically Signed   By: Inez Catalina M.D.   On: 10/19/2014 13:32    Scheduled Meds: . antiseptic oral rinse  7 mL Mouth Rinse BID  . aspirin  81 mg Oral Daily  . cefTRIAXone (ROCEPHIN)  IV  1 g Intravenous Q24H  . docusate sodium  100 mg Oral BID  . fesoterodine  4 mg Oral Daily  . insulin aspart  0-20 Units Subcutaneous TID WC  . loratadine  10 mg Oral Daily  . metoprolol tartrate   25 mg Oral BID  . rosuvastatin  20 mg Oral Daily  . sodium chloride  3 mL Intravenous Q12H   Continuous Infusions: . heparin 1,450 Units/hr (10/20/14 0837)  . sodium chloride 0.9 % 1,000 mL infusion 100 mL/hr at 10/20/14 1656    Principal Problem:   Acute left flank pain Active Problems:   Anxiety state   Aortic valve disorder   GERD   S/P AVR (aortic valve replacement)   Chest pain   UTI (lower urinary tract infection)   Hyponatremia   Anemia   Acute blood loss anemia   Acute hyponatremia    Time spent: [place] MD [facility] Pager [phone]. If 7PM-7AM, please contact night-coverage at [email or web address], password [email or web address] 10/20/2014, 6:05 PM  LOS: 1 day

## 2014-10-20 NOTE — Care Management Note (Addendum)
    Page 1 of 1   10/28/2014     11:44:32 AM CARE MANAGEMENT NOTE 10/28/2014  Patient:  [name], [name]   Account Number:  [id number]  Date Initiated:  10/20/2014  Documentation initiated by:  Dessa Phi  Subjective/Objective Assessment:   59 y/o f admitted w/acute l flank pain.Hx:uti.     Action/Plan:   From home.   Anticipated DC Date:  10/28/2014   Anticipated DC Plan:  [facility]  CM consult      Choice offered to / List presented to:             Status of service:  Completed, signed off Medicare Important Message given?   (If response is "NO", the following Medicare IM given date fields will be blank) Date Medicare IM given:   Medicare IM given by:   Date Additional Medicare IM given:   Additional Medicare IM given by:    Discharge Disposition:  HOME/SELF CARE  Per UR Regulation:  Reviewed for med. necessity/level of care/duration of stay  If discussed at [place] of Stay Meetings, dates discussed:   10/25/2014  10/27/2014    Comments:  10/28/14 Dessa Phi RN BSN NCM [phone] D/C home no needs or orders.  10/27/14 Dessa Phi RN BSN NCM [phone] uro-following for hematuria or large clots.No anticipated d/c needs.  10/24/14 Dessa Phi RN BSN NCM [phone] POD#3 uteroscopic bx, await results, & path, hgb 8.9. uro following.iv abx. d/c plan home.  10/20/14 Dessa Phi RN BSN NCM [phone] No anticipated d/c needs.

## 2014-10-20 NOTE — Progress Notes (Signed)
ANTICOAGULATION CONSULT NOTE - Follow up  Pharmacy Consult for heparin Indication: AVR  Allergies  Allergen Reactions  . Atorvastatin     REACTION: myalgias Lipitor  . Codeine     REACTION: N/V  . Morphine And Related Nausea And Vomiting  . Niacin     REACTION: flushing  . Percocet [Oxycodone-Acetaminophen] Other (See Comments)    "blisters in mouth took in 1995"    Patient Measurements: weight 82 kg , height 62 inches  Heparin weight: 68 kg Height: 5\' 2"  (157.5 cm) Weight: 192 lb 0.3 oz (87.1 kg) IBW/kg (Calculated) : 50.1  Vital Signs: Temp: 98.3 F (36.8 C) (03/10 1505) Temp Source: Oral (03/10 1505) BP: 109/56 mmHg (03/10 1505) Pulse Rate: 73 (03/10 1505)  Labs:  Recent Labs  10/19/14 0844 10/19/14 1308 10/19/14 1835 10/20/14 0214 10/20/14 0216 10/20/14 1500  HGB 11.0*  --   --  11.3*  --   --   HCT 33.0*  --   --  33.8*  --   --   PLT 281  --   --  266  --   --   LABPROT 19.7*  --   --  18.7*  --   --   INR 1.66*  --   --  1.55*  --   --   HEPARINUNFRC  --   --  <0.10*  --  0.45 0.30  CREATININE 0.94  --   --  1.14*  --   --   TROPONINI  --  <0.03 <0.03  --  <0.03  --     Estimated Creatinine Clearance: 55.8 mL/min (by C-G formula based on Cr of 1.14).  Assessment: Patient's a 59 y.o F on coumadin PTA for AVR.  She was recently evaluated at [facility] this past Sunday for UTI and hematuria. She was then discharged with keflex for suspected infection. She now presents to [facility] ED with c/o severe flank pain and hematuria.  Dr. Ralene Bathe stated that hematuria has resolved and ok to bridge with heparin while INR is subtherapeutic.  INR was 3.35 on 10/16/14.  Patient stated that she did not take her coumadin dose on 3/7 and 3/8. She took 2.5mg  dose this morning PTA. INR today is 1.66.  Cbc ok.  Home regimen: 2.5mg  daily except 5mg  on MWF  Significant events: 3/9: heparin started, first HL <0.1 on 1100units/hr, increased to 1450units/hr.    Today, 10/20/2014:  HL  0.45 but drawn only ~2.5 hours after the rate change.  AM labs were unable to be drawn. IV access was obtained this evening and HL was at lower end of therapeutic range (0.30) with infusion at 1450 units/hr.  Goal of Therapy:  Heparin level 0.3-0.7 units/ml Monitor platelets by anticoagulation protocol: Yes   Plan:  1.  Increase heparin infusion rate slightly to 1500 units/hr (15 mL/hr). 2.  Check heparin level 6 hours from rate change.  Hershal Coria, PharmD, BCPS Pager: [phone] 10/20/2014 6:04 PM

## 2014-10-20 NOTE — Progress Notes (Signed)
Subjective:  1 - Gross Hematuria, Left Flank Pain and Hydronephrosis - pt with left renal pelvis clot v. Possible urothelial mass by Ct this admission on eval gross hematuria and left flank pain. Tentatively scheudled for cysto, left retrograde, left ureteroscopy / possible biopsy 3/11. She is on heparin gtt now off coumadin for mechanical heart valve.  PMH sig for aortic valve replacement (h/o bicuspid with progressive stenosis prior), hyst.   Today [name] is seen in f/u above.   Objective: Vital signs in last 24 hours: Temp:  [98 F (36.7 C)-98.4 F (36.9 C)] 98 F (36.7 C) (03/10 0446) Pulse Rate:  [80-89] 84 (03/10 0446) Resp:  [18] 18 (03/10 0446) BP: (117-137)/(63-66) 117/66 mmHg (03/10 0446) SpO2:  [92 %-96 %] 93 % (03/10 0446) Weight:  [87.1 kg (192 lb 0.3 oz)] 87.1 kg (192 lb 0.3 oz) (03/10 0446) Last BM Date: 10/19/14  Intake/Output from previous day: 03/09 0701 - 03/10 0700 In: 2344.1 [P.O.:240; I.V.:1884.1; IV Piggyback:100] Out: 700 [Urine:650; Emesis/NG output:50] Intake/Output this shift: Total I/O In: 120 [P.O.:120] Out: 290 [Urine:290]  General appearance: alert, cooperative, appears stated age and husband at bedside Eyes: negative Nose: Nares normal. Septum midline. Mucosa normal. No drainage or sinus tenderness. Throat: lips, mucosa, and tongue normal; teeth and gums normal Neck: supple, symmetrical, trachea midline Back: symmetric, no curvature. ROM normal. No CVA tenderness. Resp: non-labored on room air Cardio: Nl rate GI: moderate trunctal obesity Extremities: extremities normal, atraumatic, no cyanosis or edema Pulses: 2+ and symmetric Skin: Skin color, texture, turgor normal. No rashes or lesions Neurologic: Grossly normal  Lab Results:   Recent Labs  10/19/14 0844 10/20/14 0214  WBC 11.6* 15.7*  HGB 11.0* 11.3*  HCT 33.0* 33.8*  PLT 281 266   BMET  Recent Labs  10/19/14 0844 10/20/14 0214  NA 129* 136  K 3.9 4.0  CL 99 104   CO2 22 23  GLUCOSE 218* 132*  BUN 14 10  CREATININE 0.94 1.14*  CALCIUM 8.2* 8.4   PT/INR  Recent Labs  10/19/14 0844 10/20/14 0214  LABPROT 19.7* 18.7*  INR 1.66* 1.55*   ABG No results for input(s): PHART, HCO3 in the last 72 hours.  Invalid input(s): PCO2, PO2  Studies/Results: Ct Abdomen Pelvis W Wo Contrast  10/19/2014   CLINICAL DATA:  59 year old female with recent history of urinary tract infection, recently passing blood clots in her urine. Abdominal pain since yesterday. Severe left flank pain since this morning. Unable to urinate.  EXAM: CT ABDOMEN AND PELVIS WITHOUT AND WITH CONTRAST  TECHNIQUE: Multidetector CT imaging of the abdomen and pelvis was performed following the standard protocol before and following the bolus administration of intravenous contrast.  CONTRAST:  157mL OMNIPAQUE IOHEXOL 300 MG/ML  SOLN  COMPARISON:  CT of the abdomen and pelvis 10/16/2014.  FINDINGS: Lower chest: Mild dependent atelectasis in the lower lobes of the lungs bilaterally (left greater than right). Mechanical aortic valve. Status post median sternotomy.  Hepatobiliary: Mild diffuse hepatic steatosis, with area of more significant focal fatty infiltration in the central aspect of segment 4B of the liver, and adjacent to the falciform ligament anteriorly in segment 4B, similar to the prior examination. There is an additional hypovascular lesion measuring 1 cm in the periphery of segment 6 (image 30 of series 6), which is unchanged in size compared to prior study 04/15/2012, and although technically indeterminate, considered benign. No other suspicious appearing hepatic lesions are noted. No intra or extrahepatic biliary ductal dilatation. Gallbladder is  normal in appearance.  Pancreas: Unremarkable.  Spleen: Unremarkable.  Adrenals/Urinary Tract: No calcifications are identified within the collecting system of either kidney, along the course of either ureter, or within the lumen of the urinary  bladder. Right kidney is normal in appearance. New moderate left-sided hydronephrosis. Notably, the left renal pelvis, most proximal aspect of the left ureter, and the lower pole collecting system of the left kidney contain high attenuation material (45 Hounsfield units), favored to represent blood. Extensive left-sided perinephric stranding. Generalized hypoperfusion throughout the renal parenchyma of the left kidney as compared to the right, without frank striated nephrogram. Delayed images demonstrate no significant excretion of contrast material by the left kidney. Normal right-sided excretion is noted. Urinary bladder is generally normal in appearance, with exception of a nondependent locule of gas, presumably iatrogenic related to recent catheterization.  Stomach/Bowel: Normal appearance of the stomach. No pathologic dilatation of small bowel or colon. Normal appendix.  Vascular/Lymphatic: Atherosclerosis throughout the abdominal and pelvic vasculature, without evidence of aneurysm or dissection. Left renal artery and vein are well visualized and are both widely patent. Numerous nonenlarged retroperitoneal lymph nodes are noted, likely reactive. No pathologically enlarged lymph nodes identified in the abdomen or pelvis.  Reproductive: Status post hysterectomy.  Ovaries are atrophic.  Other: No significant volume of ascites.  No pneumoperitoneum.  Musculoskeletal: There are no aggressive appearing lytic or blastic lesions noted in the visualized portions of the skeleton.  IMPRESSION: 1. Interval development of moderate left-sided hydronephrosis and extensive left-sided perinephric stranding, which is likely related to obstructing clot in the left renal collecting system and proximal left ureter. There is generalized hypoperfusion of the left kidney, which is favored to be related to this obstruction. No frank striated nephrogram is identified at this time to strongly suggest pyelonephritis, although correlation  with urinalysis is recommended. 2. No urinary tract calculi. 3. Normal appendix. 4. Additional incidental findings, as above. These results were called by telephone at the time of interpretation on 10/19/2014 at 4:10 pm to Dr. Irine Seal, who verbally acknowledged these results.   Electronically Signed   By: Vinnie Langton M.D.   On: 10/19/2014 16:10   US Renal  10/19/2014   CLINICAL DATA:  Hematuria and left flank pain for 4 days.  EXAM: RENAL/URINARY TRACT ULTRASOUND COMPLETE  COMPARISON:  CT abdomen and pelvis 10/16/2014  FINDINGS: Right Kidney:  Length: 10.1 cm. Echogenicity within normal limits. No mass or hydronephrosis visualized.  Left Kidney:  Length: 11.3 cm. Echogenicity within normal limits. No mass visualized. At most minimal fullness of the intrarenal collecting system without hydronephrosis.  Bladder:  Foley catheter in place with decompressed bladder.  Incidental note is made of increased liver echogenicity, incompletely evaluated but suggestive of mild hepatic steatosis.  IMPRESSION: No hydronephrosis.   Electronically Signed   By: Logan Bores   On: 10/19/2014 11:33   Dg Chest Port 1 View  10/19/2014   CLINICAL DATA:  Left-sided pain  EXAM: PORTABLE CHEST - 1 VIEW  COMPARISON:  10/13/2014  FINDINGS: The heart size and mediastinal contours are within normal limits. Both lungs are clear. The visualized skeletal structures are unremarkable. Postsurgical changes are noted.  IMPRESSION: No active disease.   Electronically Signed   By: Inez Catalina M.D.   On: 10/19/2014 13:32    Anti-infectives: Anti-infectives    Start     Dose/Rate Route Frequency Ordered Stop   10/19/14 1315  cefTRIAXone (ROCEPHIN) 1 g in dextrose 5 % 50 mL IVPB  1 g 100 mL/hr over 30 Minutes Intravenous Every 24 hours 10/19/14 1301        Assessment/Plan:  1 - Gross Hematuria, Left Flank Pain and Hydronephrosis - agree endoscopic evaluation warranted with diagnostic ureteroscopy possible biopsy to rule out  urothelial lesions. Risks including bleeding, infection, non-diagnosis, non-cure, need for staged procedure discussed as well as rare risks such as DVT, PE, MI, CVAT, Damage / loss of kidney / ureter / bladder. Will hold heparin gtt tomorrow AM.    Tresa Moore, Zeki Bedrosian 10/20/2014

## 2014-10-20 NOTE — Progress Notes (Signed)
OT Cancellation Note  Patient Details Name: [name] MRN: [id number] DOB: 22-Mar-1956   Cancelled Treatment:    Reason Eval/Treat Not Completed: OT screened, no needs identified, will sign off  [place], Thereasa Parkin 10/20/2014, 12:09 PM

## 2014-10-21 ENCOUNTER — Encounter (HOSPITAL_COMMUNITY)
Admission: EM | Disposition: A | Discharge status: Home / Self Care | Payer: Self-pay | Source: Home / Self Care | Attending: Internal Medicine

## 2014-10-21 ENCOUNTER — Inpatient Hospital Stay (HOSPITAL_COMMUNITY): Payer: Commercial Managed Care - PPO | Admitting: Anesthesiology

## 2014-10-21 ENCOUNTER — Inpatient Hospital Stay (HOSPITAL_COMMUNITY): Payer: Commercial Managed Care - PPO

## 2014-10-21 DIAGNOSIS — R079 Chest pain, unspecified: Secondary | ICD-10-CM

## 2014-10-21 DIAGNOSIS — E8770 Fluid overload, unspecified: Secondary | ICD-10-CM | POA: Insufficient documentation

## 2014-10-21 HISTORY — PX: CYSTOSCOPY WITH RETROGRADE PYELOGRAM, URETEROSCOPY AND STENT PLACEMENT: SHX5789

## 2014-10-21 LAB — BASIC METABOLIC PANEL
ANION GAP: 8 (ref 5–15)
BUN: 9 mg/dL (ref 6–23)
CHLORIDE: 105 mmol/L (ref 96–112)
CO2: 24 mmol/L (ref 19–32)
CREATININE: 1.05 mg/dL (ref 0.50–1.10)
Calcium: 8.2 mg/dL — ABNORMAL LOW (ref 8.4–10.5)
GFR, EST AFRICAN AMERICAN: 67 mL/min — AB (ref >90)
GFR, EST NON AFRICAN AMERICAN: 58 mL/min — AB (ref >90)
Glucose, Bld: 136 mg/dL — ABNORMAL HIGH (ref 70–99)
Potassium: 4 mmol/L (ref 3.5–5.1)
Sodium: 137 mmol/L (ref 135–145)

## 2014-10-21 LAB — BRAIN NATRIURETIC PEPTIDE: B NATRIURETIC PEPTIDE 5: 391.7 pg/mL — AB (ref 0.0–100.0)

## 2014-10-21 LAB — SURGICAL PCR SCREEN
MRSA, PCR: NEGATIVE
STAPHYLOCOCCUS AUREUS: NEGATIVE

## 2014-10-21 LAB — URINE CULTURE
CULTURE: NO GROWTH
Colony Count: NO GROWTH

## 2014-10-21 LAB — CBC
HCT: 28.7 % — ABNORMAL LOW (ref 36.0–46.0)
Hemoglobin: 9.6 g/dL — ABNORMAL LOW (ref 12.0–15.0)
MCH: 29.8 pg (ref 26.0–34.0)
MCHC: 33.4 g/dL (ref 30.0–36.0)
MCV: 89.1 fL (ref 78.0–100.0)
Platelets: 239 10*3/uL (ref 150–400)
RBC: 3.22 MIL/uL — AB (ref 3.87–5.11)
RDW: 14 % (ref 11.5–15.5)
WBC: 13.7 10*3/uL — AB (ref 4.0–10.5)

## 2014-10-21 LAB — GLUCOSE, CAPILLARY
GLUCOSE-CAPILLARY: 131 mg/dL — AB (ref 70–99)
GLUCOSE-CAPILLARY: 150 mg/dL — AB (ref 70–99)
Glucose-Capillary: 149 mg/dL — ABNORMAL HIGH (ref 70–99)
Glucose-Capillary: 132 mg/dL — ABNORMAL HIGH (ref 70–99)
Glucose-Capillary: 246 mg/dL — ABNORMAL HIGH (ref 70–99)

## 2014-10-21 LAB — HEPARIN LEVEL (UNFRACTIONATED): HEPARIN UNFRACTIONATED: 0.15 [IU]/mL — AB (ref 0.30–0.70)

## 2014-10-21 SURGERY — CYSTOURETEROSCOPY, WITH RETROGRADE PYELOGRAM AND STENT INSERTION
Anesthesia: General | Site: Ureter | Laterality: Left

## 2014-10-21 MED ORDER — PROPOFOL 10 MG/ML IV BOLUS
INTRAVENOUS | Status: DC | PRN
  Administered 2014-10-21: 100 mg via INTRAVENOUS

## 2014-10-21 MED ORDER — LACTATED RINGERS IV SOLN
INTRAVENOUS | Status: DC | PRN
  Administered 2014-10-21: 14:00:00 via INTRAVENOUS

## 2014-10-21 MED ORDER — SODIUM CHLORIDE 0.9 % IJ SOLN
10.0000 mL | INTRAMUSCULAR | Status: DC | PRN
  Administered 2014-10-21 – 2014-10-28 (×12): 10 mL
  Filled 2014-10-21 (×12): qty 40

## 2014-10-21 MED ORDER — LIDOCAINE HCL (CARDIAC) 20 MG/ML IV SOLN
INTRAVENOUS | Status: AC
  Filled 2014-10-21: qty 5

## 2014-10-21 MED ORDER — MEPERIDINE HCL 50 MG/ML IJ SOLN: 6.2500 mg | INTRAMUSCULAR | Status: DC | PRN

## 2014-10-21 MED ORDER — MIDAZOLAM HCL 5 MG/5ML IJ SOLN
INTRAMUSCULAR | Status: DC | PRN
  Administered 2014-10-21: 2 mg via INTRAVENOUS

## 2014-10-21 MED ORDER — PROPOFOL 10 MG/ML IV BOLUS
INTRAVENOUS | Status: AC
  Filled 2014-10-21: qty 20

## 2014-10-21 MED ORDER — FENTANYL CITRATE 0.05 MG/ML IJ SOLN
INTRAMUSCULAR | Status: AC
  Filled 2014-10-21: qty 2

## 2014-10-21 MED ORDER — FENTANYL CITRATE 0.05 MG/ML IJ SOLN
INTRAMUSCULAR | Status: DC | PRN
Start: 2014-10-21 — End: 2014-10-21
  Administered 2014-10-21 (×2): 25 ug via INTRAVENOUS
  Administered 2014-10-21: 50 ug via INTRAVENOUS

## 2014-10-21 MED ORDER — FUROSEMIDE 10 MG/ML IJ SOLN
40.0000 mg | Freq: Once | INTRAMUSCULAR | Status: AC
  Administered 2014-10-21: 40 mg via INTRAVENOUS
  Filled 2014-10-21: qty 4

## 2014-10-21 MED ORDER — ONDANSETRON HCL 4 MG/2ML IJ SOLN
INTRAMUSCULAR | Status: AC
  Filled 2014-10-21: qty 2

## 2014-10-21 MED ORDER — ONDANSETRON HCL 4 MG/2ML IJ SOLN
INTRAMUSCULAR | Status: DC | PRN
  Administered 2014-10-21: 4 mg via INTRAVENOUS

## 2014-10-21 MED ORDER — GENTAMICIN SULFATE 40 MG/ML IJ SOLN
5.0000 mg/kg | INTRAMUSCULAR | Status: AC
  Administered 2014-10-21: 320 mg via INTRAVENOUS
  Filled 2014-10-21: qty 8

## 2014-10-21 MED ORDER — MIDAZOLAM HCL 2 MG/2ML IJ SOLN
INTRAMUSCULAR | Status: AC
  Filled 2014-10-21: qty 2

## 2014-10-21 MED ORDER — SODIUM CHLORIDE 0.9 % IR SOLN
Status: DC | PRN
  Administered 2014-10-21: 5000 mL

## 2014-10-21 MED ORDER — LIDOCAINE HCL (CARDIAC) 20 MG/ML IV SOLN
INTRAVENOUS | Status: DC | PRN
  Administered 2014-10-21: 100 mg via INTRAVENOUS

## 2014-10-21 MED ORDER — DEXAMETHASONE SODIUM PHOSPHATE 10 MG/ML IJ SOLN
INTRAMUSCULAR | Status: AC
  Filled 2014-10-21: qty 1

## 2014-10-21 MED ORDER — PROMETHAZINE HCL 25 MG/ML IJ SOLN: 6.2500 mg | INTRAMUSCULAR | Status: DC | PRN

## 2014-10-21 MED ORDER — HEPARIN (PORCINE) IN NACL 100-0.45 UNIT/ML-% IJ SOLN
1750.0000 [IU]/h | INTRAMUSCULAR | Status: DC
  Administered 2014-10-22 (×2): 1900 [IU]/h via INTRAVENOUS
  Filled 2014-10-21 (×5): qty 250

## 2014-10-21 MED ORDER — GENTAMICIN IN SALINE 1.6-0.9 MG/ML-% IV SOLN: 80.0000 mg | INTRAVENOUS | Status: DC

## 2014-10-21 MED ORDER — 0.9 % SODIUM CHLORIDE (POUR BTL) OPTIME
TOPICAL | Status: DC | PRN
  Administered 2014-10-21: 1000 mL

## 2014-10-21 MED ORDER — FENTANYL CITRATE 0.05 MG/ML IJ SOLN: 25.0000 ug | INTRAMUSCULAR | Status: DC | PRN

## 2014-10-21 MED ORDER — HEPARIN (PORCINE) IN NACL 100-0.45 UNIT/ML-% IJ SOLN
1900.0000 [IU]/h | INTRAMUSCULAR | Status: DC
  Filled 2014-10-21: qty 250

## 2014-10-21 MED ORDER — SODIUM CHLORIDE 0.9 % IJ SOLN: 10.0000 mL | Freq: Two times a day (BID) | INTRAMUSCULAR | Status: DC

## 2014-10-21 MED ORDER — DEXAMETHASONE SODIUM PHOSPHATE 10 MG/ML IJ SOLN
INTRAMUSCULAR | Status: DC | PRN
  Administered 2014-10-21: 10 mg via INTRAVENOUS

## 2014-10-21 SURGICAL SUPPLY — 31 items
APPLICATOR COTTON TIP 6IN STRL (MISCELLANEOUS) ×1 IMPLANT
BAG URINE DRAINAGE (UROLOGICAL SUPPLIES) ×1 IMPLANT
BASKET LASER NITINOL 1.9FR (BASKET) ×1 IMPLANT
BASKET STNLS GEMINI 4WIRE 3FR (BASKET) IMPLANT
BASKET ZERO TIP NITINOL 2.4FR (BASKET) IMPLANT
BSKT STON RTRVL 120 1.9FR (BASKET) ×1
BSKT STON RTRVL GEM 120X11 3FR (BASKET)
BSKT STON RTRVL ZERO TP 2.4FR (BASKET)
CATH INTERMIT  6FR 70CM (CATHETERS) ×2 IMPLANT
CLOTH BEACON ORANGE TIMEOUT ST (SAFETY) ×2 IMPLANT
ELECT REM PT RETURN 9FT ADLT (ELECTROSURGICAL) ×2
ELECTRODE REM PT RTRN 9FT ADLT (ELECTROSURGICAL) IMPLANT
FIBER LASER FLEXIVA 1000 (UROLOGICAL SUPPLIES) IMPLANT
FIBER LASER FLEXIVA 200 (UROLOGICAL SUPPLIES) IMPLANT
FIBER LASER FLEXIVA 365 (UROLOGICAL SUPPLIES) IMPLANT
FIBER LASER FLEXIVA 550 (UROLOGICAL SUPPLIES) IMPLANT
FIBER LASER TRAC TIP (UROLOGICAL SUPPLIES) IMPLANT
FORCEPS BIOP 2.4F 115CM BACKLD (INSTRUMENTS) ×1 IMPLANT
GLOVE BIOGEL M STRL SZ7.5 (GLOVE) ×2 IMPLANT
GOWN STRL REUS W/TWL LRG LVL3 (GOWN DISPOSABLE) ×4 IMPLANT
GUIDEWIRE ANG ZIPWIRE 038X150 (WIRE) ×2 IMPLANT
GUIDEWIRE STR DUAL SENSOR (WIRE) ×2 IMPLANT
IV NS IRRIG 3000ML ARTHROMATIC (IV SOLUTION) ×2 IMPLANT
PACK CYSTO (CUSTOM PROCEDURE TRAY) ×2 IMPLANT
PAD TELFA 2X3 NADH STRL (GAUZE/BANDAGES/DRESSINGS) ×1 IMPLANT
SHEATH ACCESS URETERAL 24CM (SHEATH) ×1 IMPLANT
SHIELD EYE BINOCULAR (MISCELLANEOUS) IMPLANT
STENT URET 6FRX24 CONTOUR (STENTS) ×1 IMPLANT
SYRINGE 10CC LL (SYRINGE) IMPLANT
SYRINGE IRR TOOMEY STRL 70CC (SYRINGE) IMPLANT
TUBE FEEDING 8FR 16IN STR KANG (MISCELLANEOUS) ×2 IMPLANT

## 2014-10-21 NOTE — Progress Notes (Signed)
Peripherally Inserted Central Catheter/Midline Placement  The IV Nurse has discussed with the patient and/or persons authorized to consent for the patient, the purpose of this procedure and the potential benefits and risks involved with this procedure.  The benefits include less needle sticks, lab draws from the catheter and patient may be discharged home with the catheter.  Risks include, but not limited to, infection, bleeding, blood clot (thrombus formation), and puncture of an artery; nerve damage and irregular heat beat.  Alternatives to this procedure were also discussed.  PICC/Midline Placement Documentation  PICC / Midline Single Lumen 03/40/35 PICC Right Basilic 36 cm 0 cm (Active)  Indication for Insertion or Continuance of Line Administration of hyperosmolar/irritating solutions (i.e. TPN, Vancomycin, etc.) 10/21/2014  9:11 AM  Exposed Catheter (cm) 0 cm 10/21/2014  9:11 AM  Line Status Flushed;Saline locked;Blood return noted 10/21/2014  9:11 AM  Dressing Change Due 10/28/14 10/21/2014  9:11 AM       Gordan Payment 10/21/2014, 9:25 AM

## 2014-10-21 NOTE — Progress Notes (Signed)
[facility] PROGRESS NOTE  [name] EVO:[id number] DOB: 12-24-1955 DOA: 10/19/2014 PCP: Ileana Roup, MD  Assessment/Plan: #1 left flank pain/gross hematuria/hydronephrosis CT of the abdomen and pelvis with left renal pelvic clot versus possible urothelial mass. Patient's gross hematuria improving, however still with bloody urine. Patient has been seen by urology and patient likely will undergo cystoscopy, left retrograde, left ureteroscopy, possible biopsy today. Patient currently on heparin drip. Coumadin on hold. Urology following and appreciate input and recommendations.  #2 urinary tract infection Patient was on Keflex as outpatient. Urine cultures at that time were negative. Continue IV Rocephin.  #3 hyponatremia  Improved with hydration. Follow.  #4 chest pain Patient denies any further chest pain. Patient states this is her chronic chest pain. Cardiac enzymes negative 3. 2-D echo with EF of 60-65% with no wall motion abnormalities. Aortic valve with moderate stenosis. IV heparin to be held today for procedure. No further cardiac workup needed at this time.  #5 Volume overload vs acute diastolic CHF exac Patient with some SOB this morning. CXR c/w volume overload. Patient given lasix 40mg  IV x 1 with clinical improvement. NSL IVF. Will give another dose of IV lasix this afternoon. Follow.  #6 acute blood loss anemia/anemia Likely secondary to gross hematuria. Hgb at 9.6 from 11.3. Follow.  #7 status post aortic valve replacement Currently on IV heparin. Coumadin on hold in anticipation of upper procedure today. Iv heparin to be held prior to procedure.  #8 gastroesophageal reflux disease PPI.  #9 prophylaxis PPI for GI prophylaxis. Heparin for DVT prophylaxis.  Code Status: Full Family Communication: Updated patient and husband at bedside. Disposition Plan: Remain inpatient.   Consultants:  Urology: Dr. Alinda Money 10/19/2014  Procedures:  CT abdomen  and pelvis 10/19/2014  Renal ultrasound 10/19/2014  Chest x-ray 10/19/2014, 10/21/14  2-D echo 10/20/2014  Antibiotics:  IV Rocephin 10/19/2014    HPI/Subjective: Patient still with blood in urine. Patient denies any nausea, or emesis. Patient states SOB improved from early on this morning.  Objective: Filed Vitals:   10/21/14 0448  BP: 110/58  Pulse: 80  Temp: 98 F (36.7 C)  Resp:     Intake/Output Summary (Last 24 hours) at 10/21/14 1044 Last data filed at 10/21/14 0801  Gross per 24 hour  Intake 1996.27 ml  Output   2450 ml  Net -453.73 ml   Filed Weights   10/19/14 1225 10/20/14 0446 10/21/14 0448  Weight: 81.647 kg (180 lb) 87.1 kg (192 lb 0.3 oz) 83.235 kg (183 lb 8 oz)    Exam:   General:  NAD  Cardiovascular: RRR with 3/6 SEM  Respiratory: Bibasilar crackles  Abdomen: Soft, nondistended, positive bowel sounds. Left CVA tenderness to palpation.  Musculoskeletal: No clubbing cyanosis or edema.  Data Reviewed: Basic Metabolic Panel:  Recent Labs Lab 10/16/14 2158 10/19/14 0844 10/19/14 1308 10/20/14 0214 10/21/14 0143  NA 138 129*  --  136 137  K 3.7 3.9  --  4.0 4.0  CL 105 99  --  104 105  CO2 26 22  --  23 24  GLUCOSE 143* 218*  --  132* 136*  BUN 11 14  --  10 9  CREATININE 0.78 0.94  --  1.14* 1.05  CALCIUM 9.1 8.2*  --  8.4 8.2*  MG  --   --  1.8  --   --    Liver Function Tests:  Recent Labs Lab 10/19/14 0844  AST 26  ALT 15  ALKPHOS 75  BILITOT 0.5  PROT 7.0  ALBUMIN 3.9   No results for input(s): LIPASE, AMYLASE in the last 168 hours. No results for input(s): AMMONIA in the last 168 hours. CBC:  Recent Labs Lab 10/16/14 2158 10/19/14 0844 10/20/14 0214 10/21/14 0143  WBC 11.8* 11.6* 15.7* 13.7*  NEUTROABS 5.6 7.6  --   --   HGB 13.5 11.0* 11.3* 9.6*  HCT 40.8 33.0* 33.8* 28.7*  MCV 87.9 89.7 88.9 89.1  PLT 301 281 266 239   Cardiac Enzymes:  Recent Labs Lab 10/19/14 1308 10/19/14 1835  10/20/14 0216  TROPONINI <0.03 <0.03 <0.03   BNP (last 3 results) No results for input(s): BNP in the last 8760 hours.  ProBNP (last 3 results) No results for input(s): PROBNP in the last 8760 hours.  CBG:  Recent Labs Lab 10/20/14 0743 10/20/14 1144 10/20/14 1655 10/20/14 2157 10/21/14 0725  GLUCAP 136* 129* 139* 140* 149*    Recent Results (from the past 240 hour(s))  Urine culture     Status: None   Collection Time: 10/16/14 10:00 PM  Result Value Ref Range Status   Specimen Description URINE, RANDOM  Final   Special Requests NONE  Final   Colony Count   Final    2,000 COLONIES/ML Performed at [facility]    Culture   Final    INSIGNIFICANT GROWTH Performed at [facility]    Report Status 10/18/2014 FINAL  Final  Culture, blood (routine x 2)     Status: None (Preliminary result)   Collection Time: 10/20/14  9:37 AM  Result Value Ref Range Status   Specimen Description BLOOD RIGHT HAND  Final   Special Requests BOTTLES DRAWN AEROBIC ONLY 3CC  Final   Culture   Final           BLOOD CULTURE RECEIVED NO GROWTH TO DATE CULTURE WILL BE HELD FOR 5 DAYS BEFORE ISSUING A FINAL NEGATIVE REPORT Note: Culture results may be compromised due to an inadequate volume of blood received in culture bottles. Performed at [facility]    Report Status PENDING  Incomplete     Studies: Ct Abdomen Pelvis W Wo Contrast  10/19/2014   CLINICAL DATA:  59 year old female with recent history of urinary tract infection, recently passing blood clots in her urine. Abdominal pain since yesterday. Severe left flank pain since this morning. Unable to urinate.  EXAM: CT ABDOMEN AND PELVIS WITHOUT AND WITH CONTRAST  TECHNIQUE: Multidetector CT imaging of the abdomen and pelvis was performed following the standard protocol before and following the bolus administration of intravenous contrast.  CONTRAST:  159mL OMNIPAQUE IOHEXOL 300 MG/ML  SOLN  COMPARISON:  CT of the abdomen  and pelvis 10/16/2014.  FINDINGS: Lower chest: Mild dependent atelectasis in the lower lobes of the lungs bilaterally (left greater than right). Mechanical aortic valve. Status post median sternotomy.  Hepatobiliary: Mild diffuse hepatic steatosis, with area of more significant focal fatty infiltration in the central aspect of segment 4B of the liver, and adjacent to the falciform ligament anteriorly in segment 4B, similar to the prior examination. There is an additional hypovascular lesion measuring 1 cm in the periphery of segment 6 (image 30 of series 6), which is unchanged in size compared to prior study 04/15/2012, and although technically indeterminate, considered benign. No other suspicious appearing hepatic lesions are noted. No intra or extrahepatic biliary ductal dilatation. Gallbladder is normal in appearance.  Pancreas: Unremarkable.  Spleen: Unremarkable.  Adrenals/Urinary Tract: No calcifications are identified  within the collecting system of either kidney, along the course of either ureter, or within the lumen of the urinary bladder. Right kidney is normal in appearance. New moderate left-sided hydronephrosis. Notably, the left renal pelvis, most proximal aspect of the left ureter, and the lower pole collecting system of the left kidney contain high attenuation material (45 Hounsfield units), favored to represent blood. Extensive left-sided perinephric stranding. Generalized hypoperfusion throughout the renal parenchyma of the left kidney as compared to the right, without frank striated nephrogram. Delayed images demonstrate no significant excretion of contrast material by the left kidney. Normal right-sided excretion is noted. Urinary bladder is generally normal in appearance, with exception of a nondependent locule of gas, presumably iatrogenic related to recent catheterization.  Stomach/Bowel: Normal appearance of the stomach. No pathologic dilatation of small bowel or colon. Normal appendix.   Vascular/Lymphatic: Atherosclerosis throughout the abdominal and pelvic vasculature, without evidence of aneurysm or dissection. Left renal artery and vein are well visualized and are both widely patent. Numerous nonenlarged retroperitoneal lymph nodes are noted, likely reactive. No pathologically enlarged lymph nodes identified in the abdomen or pelvis.  Reproductive: Status post hysterectomy.  Ovaries are atrophic.  Other: No significant volume of ascites.  No pneumoperitoneum.  Musculoskeletal: There are no aggressive appearing lytic or blastic lesions noted in the visualized portions of the skeleton.  IMPRESSION: 1. Interval development of moderate left-sided hydronephrosis and extensive left-sided perinephric stranding, which is likely related to obstructing clot in the left renal collecting system and proximal left ureter. There is generalized hypoperfusion of the left kidney, which is favored to be related to this obstruction. No frank striated nephrogram is identified at this time to strongly suggest pyelonephritis, although correlation with urinalysis is recommended. 2. No urinary tract calculi. 3. Normal appendix. 4. Additional incidental findings, as above. These results were called by telephone at the time of interpretation on 10/19/2014 at 4:10 pm to Dr. Irine Seal, who verbally acknowledged these results.   Electronically Signed   By: Vinnie Langton M.D.   On: 10/19/2014 16:10   US Renal  10/19/2014   CLINICAL DATA:  Hematuria and left flank pain for 4 days.  EXAM: RENAL/URINARY TRACT ULTRASOUND COMPLETE  COMPARISON:  CT abdomen and pelvis 10/16/2014  FINDINGS: Right Kidney:  Length: 10.1 cm. Echogenicity within normal limits. No mass or hydronephrosis visualized.  Left Kidney:  Length: 11.3 cm. Echogenicity within normal limits. No mass visualized. At most minimal fullness of the intrarenal collecting system without hydronephrosis.  Bladder:  Foley catheter in place with decompressed bladder.   Incidental note is made of increased liver echogenicity, incompletely evaluated but suggestive of mild hepatic steatosis.  IMPRESSION: No hydronephrosis.   Electronically Signed   By: Logan Bores   On: 10/19/2014 11:33   Dg Chest Port 1 View  10/21/2014   CLINICAL DATA:  Sudden onset of shortness of breath, heaviness in chest.  EXAM: PORTABLE CHEST - 1 VIEW  COMPARISON:  10/19/2014  FINDINGS: Lung volumes remain low. Patient is post median sternotomy. Development of pulmonary edema and left pleural effusion. Cardiomediastinal contours are unchanged. There is linear atelectasis in the left midlung zone. No pneumothorax. No acute osseous abnormality.  IMPRESSION: Development of pulmonary edema and left pleural effusion, consistent with CHF.   Electronically Signed   By: Jeb Levering M.D.   On: 10/21/2014 04:38   Dg Chest Port 1 View  10/19/2014   CLINICAL DATA:  Left-sided pain  EXAM: PORTABLE CHEST - 1 VIEW  COMPARISON:  10/13/2014  FINDINGS: The heart size and mediastinal contours are within normal limits. Both lungs are clear. The visualized skeletal structures are unremarkable. Postsurgical changes are noted.  IMPRESSION: No active disease.   Electronically Signed   By: Inez Catalina M.D.   On: 10/19/2014 13:32   Dg Chest Port 1v Same Day  10/21/2014   CLINICAL DATA:  Reassess CHF  EXAM: PORTABLE CHEST - 1 VIEW SAME DAY  COMPARISON:  Film from earlier in the same day  FINDINGS: Cardiac shadow is stable. Postoperative changes are again seen. A right-sided PICC line is now noted with the catheter tip in the mid superior vena cava in satisfactory position. Mild left basilar atelectasis is noted. The degree of vascular congestion has improved significantly in the interval from the prior exam. Mild residual changes are noted.  IMPRESSION: Significant improvement in CHF. Minimal residual atelectasis is noted.   Electronically Signed   By: Inez Catalina M.D.   On: 10/21/2014 10:11    Scheduled Meds: .  antiseptic oral rinse  7 mL Mouth Rinse BID  . aspirin  81 mg Oral Daily  . cefTRIAXone (ROCEPHIN)  IV  1 g Intravenous Q24H  . docusate sodium  100 mg Oral BID  . fesoterodine  4 mg Oral Daily  . gentamicin  80 mg Intravenous 30 min Pre-Op  . insulin aspart  0-20 Units Subcutaneous TID WC  . loratadine  10 mg Oral Daily  . metoprolol tartrate  25 mg Oral BID  . rosuvastatin  20 mg Oral Daily  . sodium chloride  10-40 mL Intracatheter Q12H  . sodium chloride  3 mL Intravenous Q12H   Continuous Infusions:    Principal Problem:   Acute left flank pain Active Problems:   Anxiety state   Aortic valve disorder   GERD   S/P AVR (aortic valve replacement)   Chest pain   UTI (lower urinary tract infection)   Hyponatremia   Anemia   Acute blood loss anemia   Acute hyponatremia    Time spent: [place] MD [facility] Pager [phone]. If 7PM-7AM, please contact night-coverage at [email or web address], password [facility] 10/21/2014, 10:44 AM  LOS: 2 days

## 2014-10-21 NOTE — Transfer of Care (Signed)
Immediate Anesthesia Transfer of Care Note  Patient: [name]  Procedure(s) Performed: Procedure(s): CYSTOSCOPY WITH LEFT RETROGRADE PYELOGRAM, URETEROSCOPY BIOPSY AND STENT PLACEMENT (Left)  Patient Location: PACU  Anesthesia Type:General  Level of Consciousness: awake, sedated and patient cooperative  Airway & Oxygen Therapy: Patient Spontanous Breathing and Patient connected to face mask oxygen  Post-op Assessment: Report given to RN and Post -op Vital signs reviewed and stable  Post vital signs: Reviewed and stable  Last Vitals:  Filed Vitals:   10/21/14 0448  BP: 110/58  Pulse: 80  Temp: 36.7 C  Resp:     Complications: No apparent anesthesia complications

## 2014-10-21 NOTE — Progress Notes (Signed)
ANTICOAGULATION CONSULT NOTE - Follow up  Pharmacy Consult for heparin Indication: AVR  Allergies  Allergen Reactions  . Atorvastatin     REACTION: myalgias Lipitor  . Codeine     REACTION: N/V  . Morphine And Related Nausea And Vomiting  . Niacin     REACTION: flushing  . Percocet [Oxycodone-Acetaminophen] Other (See Comments)    "blisters in mouth took in 1995"    Patient Measurements: weight 82 kg , height 62 inches  Heparin weight: 68 kg Height: 5\' 2"  (157.5 cm) Weight: 192 lb 0.3 oz (87.1 kg) IBW/kg (Calculated) : 50.1  Vital Signs: Temp: 98.1 F (36.7 C) (03/10 2141) Temp Source: Oral (03/10 2141) BP: 123/71 mmHg (03/10 2141) Pulse Rate: 87 (03/10 2141)  Labs:  Recent Labs  10/19/14 0844 10/19/14 1308  10/19/14 1835 10/20/14 0214 10/20/14 0216 10/20/14 1500 10/21/14 0143  HGB 11.0*  --   --   --  11.3*  --   --  9.6*  HCT 33.0*  --   --   --  33.8*  --   --  28.7*  PLT 281  --   --   --  266  --   --  239  LABPROT 19.7*  --   --   --  18.7*  --   --   --   INR 1.66*  --   --   --  1.55*  --   --   --   HEPARINUNFRC  --   --   < > <0.10*  --  0.45 0.30 0.15*  CREATININE 0.94  --   --   --  1.14*  --   --  1.05  TROPONINI  --  <0.03  --  <0.03  --  <0.03  --   --   < > = values in this interval not displayed.  Estimated Creatinine Clearance: 60.6 mL/min (by C-G formula based on Cr of 1.05).  Assessment: Patient's a 59 y.o F on coumadin PTA for AVR.  She was recently evaluated at [facility] this past Sunday for UTI and hematuria. She was then discharged with keflex for suspected infection. She now presents to [facility] ED with c/o severe flank pain and hematuria.  Dr. Ralene Bathe stated that hematuria has resolved and ok to bridge with heparin while INR is subtherapeutic.  INR was 3.35 on 10/16/14.  Patient stated that she did not take her coumadin dose on 3/7 and 3/8. She took 2.5mg  dose this morning PTA. INR today is 1.66.  Cbc ok.  Home regimen: 2.5mg  daily except 5mg  on  MWF  Significant events: 3/9: heparin started, first HL <0.1 on 1100units/hr, increased to 1450units/hr.     10/20/2014  HL 0.45 but drawn only ~2.5 hours after the rate change. AM labs were unable to be drawn. IV access was obtained this evening and HL was at lower end of therapeutic range (0.30) with infusion at 1450 units/hr.  Today, 10/21/2014  0030 HL = 0.15, no problems per RN, difficulty getting blood was drawn late.   Goal of Therapy:  Heparin level 0.3-0.7 units/ml Monitor platelets by anticoagulation protocol: Yes   Plan:  1.  Increase heparin infusion rate to 1900 units/hr 2.  Check heparin level 6 hours from rate change.   Dorrene German 10/21/2014 4:50 AM

## 2014-10-21 NOTE — Anesthesia Postprocedure Evaluation (Signed)
  Anesthesia Post-op Note  Patient: [name]  Procedure(s) Performed: Procedure(s) (LRB): CYSTOSCOPY WITH LEFT RETROGRADE PYELOGRAM, URETEROSCOPY BIOPSY AND STENT PLACEMENT (Left)  Patient Location: PACU  Anesthesia Type: General  Level of Consciousness: awake and alert   Airway and Oxygen Therapy: Patient Spontanous Breathing  Post-op Pain: mild  Post-op Assessment: Post-op Vital signs reviewed, Patient's Cardiovascular Status Stable, Respiratory Function Stable, Patent Airway and No signs of Nausea or vomiting  Last Vitals:  Filed Vitals:   10/21/14 1600  BP: 128/58  Pulse: 76  Temp: 37.4 C  Resp: 21    Post-op Vital Signs: stable   Complications: No apparent anesthesia complications

## 2014-10-21 NOTE — Anesthesia Preprocedure Evaluation (Addendum)
Anesthesia Evaluation  Patient identified by MRN, date of birth, ID band Patient awake    Reviewed: Allergy & Precautions, H&P , NPO status , Patient's Chart, lab work & pertinent test results  History of Anesthesia Complications (+) PONV and history of anesthetic complications  Airway Mallampati: II  TM Distance: <3 FB Neck ROM: full    Dental  (+) Teeth Intact   Pulmonary shortness of breath and with exertion, pneumonia - (resolved), former smoker,  breath sounds clear to auscultation        Cardiovascular Exercise Tolerance: Poor hypertension, Pt. on home beta blockers - angina+CHF + Valvular Problems/Murmurs AS Rhythm:regular Rate:Normal + Systolic murmurs Echo 5/68/1275 - Left ventricle: The cavity size was normal. Wall thickness wasincreased in a pattern of mild LVH. Systolic function was normal.The estimated ejection fraction was in the range of 60% to 65%.Wall motion was normal; there were no regional wall motionabnormalities. Features are consistent with a pseudonormal left ventricular filling pattern, with concomitant abnormal relaxationand increased filling pressure (grade 2 diastolic dysfunction). - Aortic valve: There was moderate stenosis. - Left atrium: The atrium was mildly dilated. - Right atrium: The atrium was mildly dilated. - Pulmonary arteries: Systolic pressure was moderately increased.PA peak pressure: 49 mm Hg (S).    Neuro/Psych  Headaches, PSYCHIATRIC DISORDERS Anxiety    GI/Hepatic Neg liver ROS, GERD-  Controlled and Medicated,  Endo/Other  diabetes, Well Controlled, Type 2, Oral Hypoglycemic Agents  Renal/GU negative Renal ROS     Musculoskeletal  (+) Arthritis -,   Abdominal   Peds  Hematology negative hematology ROS (+) anemia ,   Anesthesia Other Findings Neck short w/ 2FB to TM  Reproductive/Obstetrics negative OB ROS                            Anesthesia Physical  Anesthesia Plan  ASA: III  Anesthesia Plan: General   Post-op Pain Management:    Induction: Intravenous  Airway Management Planned: LMA  Additional Equipment: None  Intra-op Plan:   Post-operative Plan: Extubation in OR  Informed Consent: I have reviewed the patients History and Physical, chart, labs and discussed the procedure including the risks, benefits and alternatives for the proposed anesthesia with the patient or authorized representative who has indicated his/her understanding and acceptance.   Dental advisory given  Plan Discussed with: CRNA  Anesthesia Plan Comments:         Anesthesia Quick Evaluation

## 2014-10-21 NOTE — Progress Notes (Signed)
ANTICOAGULATION CONSULT NOTE - Follow up  Pharmacy Consult for heparin Indication: AVR  Allergies  Allergen Reactions  . Atorvastatin     REACTION: myalgias Lipitor  . Codeine     REACTION: N/V  . Morphine And Related Nausea And Vomiting  . Niacin     REACTION: flushing  . Percocet [Oxycodone-Acetaminophen] Other (See Comments)    "blisters in mouth took in 1995"    Patient Measurements: weight 82 kg , height 62 inches  Heparin weight: 68 kg Height: 5\' 2"  (157.5 cm) Weight: 183 lb 8 oz (83.235 kg) IBW/kg (Calculated) : 50.1  Vital Signs: Temp: 98 F (36.7 C) (03/11 0448) Temp Source: Oral (03/11 0448) BP: 110/58 mmHg (03/11 0448) Pulse Rate: 80 (03/11 0448)  Labs:  Recent Labs  10/19/14 0844 10/19/14 1308  10/19/14 1835 10/20/14 0214 10/20/14 0216 10/20/14 1500 10/21/14 0143  HGB 11.0*  --   --   --  11.3*  --   --  9.6*  HCT 33.0*  --   --   --  33.8*  --   --  28.7*  PLT 281  --   --   --  266  --   --  239  LABPROT 19.7*  --   --   --  18.7*  --   --   --   INR 1.66*  --   --   --  1.55*  --   --   --   HEPARINUNFRC  --   --   < > <0.10*  --  0.45 0.30 0.15*  CREATININE 0.94  --   --   --  1.14*  --   --  1.05  TROPONINI  --  <0.03  --  <0.03  --  <0.03  --   --   < > = values in this interval not displayed.  Estimated Creatinine Clearance: 59.1 mL/min (by C-G formula based on Cr of 1.05).  Assessment: Patient's a 59 y.o F on coumadin PTA for AVR.  She was recently evaluated at [facility] this past Sunday for UTI and hematuria. She was then discharged with keflex for suspected infection. She now presents to [facility] ED with c/o severe flank pain and hematuria.  Dr. Ralene Bathe stated that hematuria has resolved and ok to bridge with heparin while INR is subtherapeutic.  INR was 3.35 on 10/16/14.  Patient stated that she did not take her coumadin dose on 3/7 and 3/8. She took 2.5mg  dose this morning PTA. INR today is 1.66.  Cbc ok.  Home regimen: 2.5mg  daily except 5mg  on  MWF  Significant events: 3/9: heparin started, first HL <0.1 on 1100units/hr, increased to 1450units/hr.  3/10: HL 0.45 but drawn only ~2.5 hours after the rate change. Cont same. AM labs delayed, due to difficult stick.  HL 0.3, increased to 1500units/hr.  Today, 10/21/2014: HL 0.15 on 1500units/hr so it was increased to 1900units/hr. Then heparin was turned off this AM on call to OR. CBC ok.  No bleeding reported/documented.  Goal of Therapy:  Heparin level 0.3-0.7 units/ml Monitor platelets by anticoagulation protocol: Yes   Plan:  Pharmacy was informed by Dr. Grandville Silos that Urology recommended resuming heparin at 24:00 with no bolus. Will use 1900units/hr. Check heparin level 6hrs after starting. Check CBC q24h while on heparin. F/u daily.  Romeo Rabon, PharmD, pager [phone]. 10/21/2014,3:26 PM.

## 2014-10-21 NOTE — Progress Notes (Signed)
Pt called out saying she was SOB, O2 sats checked O2 85% on room air. Pt also complaining of swelling in her hands, and felt like she had had "too much fluids." upon further assessment, heard fine crackles in lower lobes. NP on call notified, NP on call assessed pt and in agreement. Pt place on 4L [place], O2 sats up to 92%. New orders given will continue to monitor.

## 2014-10-21 NOTE — Brief Op Note (Signed)
10/19/2014 - 10/21/2014  3:02 PM  PATIENT:  [name]  59 y.o. female  PRE-OPERATIVE DIAGNOSIS:  LEFT HYDRONEPROSIS HEMATURIA POSS RENAL PELVIC MASS   POST-OPERATIVE DIAGNOSIS:  LEFT HYDRONEPROSIS HEMATURIA POSS RENAL PELVIC MASS   PROCEDURE:  Procedure(s): CYSTOSCOPY WITH LEFT RETROGRADE PYELOGRAM, URETEROSCOPY BIOPSY AND STENT PLACEMENT (Left)  SURGEON:  Surgeon(s) and Role:    * Alexis Frock, MD - Primary  PHYSICIAN ASSISTANT:   ASSISTANTS: none   ANESTHESIA:   general  EBL:  Total I/O In: 50.8 [I.V.:50.8] Out: -   BLOOD ADMINISTERED:none  DRAINS: none   LOCAL MEDICATIONS USED:  NONE  SPECIMEN:  Source of Specimen:  1 - bladder erythema, 2 - renal pelvis tissue / clot, 3 - renal pelvis erythema  DISPOSITION OF SPECIMEN:  PATHOLOGY  COUNTS:  YES  TOURNIQUET:  * No tourniquets in log *  DICTATION: .Other Dictation: Dictation Number [id number]  PLAN OF CARE: Admit to inpatient   PATIENT DISPOSITION:  PACU - hemodynamically stable.   Delay start of Pharmacological VTE agent (>24hrs) due to surgical blood loss or risk of bleeding: yes

## 2014-10-21 NOTE — Progress Notes (Signed)
Shift event note:  Notified by RN at approx 0320 regarding pt c/o SOB. RN assessed and noted 02 sats in mid 80's on r/a and crackles bil w/o acute resp distress. Pt reported to RN her hands felt swollen and she feels like she has had too much fluid. Stat PCXR ordered. At bedside pt in no acute respiratory distress though respirations very shallow. Pt states it hurts to take a deep breath. BBS diminished in upper lobes w/ fine crackles bil. 02 sats in high 80's on 2L [place]. Slow return to baseline after 02 increased to 4L/min. Remaining VSS. PCXR findings c/w pulmonary edema. New since last CXR on 10/19/14. 2-D echo from 10/20/14 > EF of 60-65% and moderate aortic valve stenosis. CE's have remained negative s/p an initial c/o CP on admission which pt relates is chronic.  Assessment/Plan: 1. Pulmonary edema: New onset in clinical setting of pt s/p aortic valve replacement 2012. Lasix 40 mg given IV. IVF's deceased from 100 to 50 cc/hr. Added continuous pulse oximetry. At the time of this note pt was reporting breathing improved and 02 sats 95% on 4L [place]. Will continue to monitor closely on telemetry.   Jeryl Columbia, NP-C [facility] Pager [phone]

## 2014-10-21 NOTE — Progress Notes (Signed)
Subjective:  1 - Gross Hematuria, Left Flank Pain and Hydronephrosis - pt with left renal pelvis clot v. Possible urothelial mass by Ct this admission on eval gross hematuria and left flank pain. Now scheudled for cysto, left retrograde, left ureteroscopy / possible biopsy today. She is on heparin gtt now off coumadin for mechanical heart valve.  PMH sig for aortic valve replacement (h/o bicuspid with progressive stenosis prior), hyst.   Today [name] is seen in f/u above. No events overnight.   Objective: Vital signs in last 24 hours: Temp:  [98 F (36.7 C)-98.3 F (36.8 C)] 98 F (36.7 C) (03/11 0448) Pulse Rate:  [73-87] 80 (03/11 0448) Resp:  [16-19] 16 (03/10 2141) BP: (109-123)/(56-71) 110/58 mmHg (03/11 0448) SpO2:  [83 %-97 %] 97 % (03/11 0448) Weight:  [83.235 kg (183 lb 8 oz)] 83.235 kg (183 lb 8 oz) (03/11 0448) Last BM Date: 10/19/14  Intake/Output from previous day: 03/10 0701 - 03/11 0700 In: 1233.8 [P.O.:485; I.V.:748.8] Out: 940 [Urine:940] Intake/Output this shift: Total I/O In: 868.8 [P.O.:120; I.V.:748.8] Out: 650 [Urine:650]  General appearance: alert, cooperative and appears stated age Nose: Nares normal. Septum midline. Mucosa normal. No drainage or sinus tenderness. Throat: lips, mucosa, and tongue normal; teeth and gums normal Neck: supple, symmetrical, trachea midline Back: symmetric, no curvature. ROM normal. No CVA tenderness. Resp: non-labored on room air Cardio: Nl rate Extremities: extremities normal, atraumatic, no cyanosis or edema Pulses: 2+ and symmetric Skin: Skin color, texture, turgor normal. No rashes or lesions Lymph nodes: Cervical, supraclavicular, and axillary nodes normal. Neurologic: Grossly normal  Lab Results:   Recent Labs  10/20/14 0214 10/21/14 0143  WBC 15.7* 13.7*  HGB 11.3* 9.6*  HCT 33.8* 28.7*  PLT 266 239   BMET  Recent Labs  10/20/14 0214 10/21/14 0143  NA 136 137  K 4.0 4.0  CL 104 105  CO2 23 24   GLUCOSE 132* 136*  BUN 10 9  CREATININE 1.14* 1.05  CALCIUM 8.4 8.2*   PT/INR  Recent Labs  10/19/14 0844 10/20/14 0214  LABPROT 19.7* 18.7*  INR 1.66* 1.55*   ABG No results for input(s): PHART, HCO3 in the last 72 hours.  Invalid input(s): PCO2, PO2  Studies/Results: Ct Abdomen Pelvis W Wo Contrast  10/19/2014   CLINICAL DATA:  59 year old female with recent history of urinary tract infection, recently passing blood clots in her urine. Abdominal pain since yesterday. Severe left flank pain since this morning. Unable to urinate.  EXAM: CT ABDOMEN AND PELVIS WITHOUT AND WITH CONTRAST  TECHNIQUE: Multidetector CT imaging of the abdomen and pelvis was performed following the standard protocol before and following the bolus administration of intravenous contrast.  CONTRAST:  117mL OMNIPAQUE IOHEXOL 300 MG/ML  SOLN  COMPARISON:  CT of the abdomen and pelvis 10/16/2014.  FINDINGS: Lower chest: Mild dependent atelectasis in the lower lobes of the lungs bilaterally (left greater than right). Mechanical aortic valve. Status post median sternotomy.  Hepatobiliary: Mild diffuse hepatic steatosis, with area of more significant focal fatty infiltration in the central aspect of segment 4B of the liver, and adjacent to the falciform ligament anteriorly in segment 4B, similar to the prior examination. There is an additional hypovascular lesion measuring 1 cm in the periphery of segment 6 (image 30 of series 6), which is unchanged in size compared to prior study 04/15/2012, and although technically indeterminate, considered benign. No other suspicious appearing hepatic lesions are noted. No intra or extrahepatic biliary ductal dilatation. Gallbladder is normal  in appearance.  Pancreas: Unremarkable.  Spleen: Unremarkable.  Adrenals/Urinary Tract: No calcifications are identified within the collecting system of either kidney, along the course of either ureter, or within the lumen of the urinary bladder. Right  kidney is normal in appearance. New moderate left-sided hydronephrosis. Notably, the left renal pelvis, most proximal aspect of the left ureter, and the lower pole collecting system of the left kidney contain high attenuation material (45 Hounsfield units), favored to represent blood. Extensive left-sided perinephric stranding. Generalized hypoperfusion throughout the renal parenchyma of the left kidney as compared to the right, without frank striated nephrogram. Delayed images demonstrate no significant excretion of contrast material by the left kidney. Normal right-sided excretion is noted. Urinary bladder is generally normal in appearance, with exception of a nondependent locule of gas, presumably iatrogenic related to recent catheterization.  Stomach/Bowel: Normal appearance of the stomach. No pathologic dilatation of small bowel or colon. Normal appendix.  Vascular/Lymphatic: Atherosclerosis throughout the abdominal and pelvic vasculature, without evidence of aneurysm or dissection. Left renal artery and vein are well visualized and are both widely patent. Numerous nonenlarged retroperitoneal lymph nodes are noted, likely reactive. No pathologically enlarged lymph nodes identified in the abdomen or pelvis.  Reproductive: Status post hysterectomy.  Ovaries are atrophic.  Other: No significant volume of ascites.  No pneumoperitoneum.  Musculoskeletal: There are no aggressive appearing lytic or blastic lesions noted in the visualized portions of the skeleton.  IMPRESSION: 1. Interval development of moderate left-sided hydronephrosis and extensive left-sided perinephric stranding, which is likely related to obstructing clot in the left renal collecting system and proximal left ureter. There is generalized hypoperfusion of the left kidney, which is favored to be related to this obstruction. No frank striated nephrogram is identified at this time to strongly suggest pyelonephritis, although correlation with  urinalysis is recommended. 2. No urinary tract calculi. 3. Normal appendix. 4. Additional incidental findings, as above. These results were called by telephone at the time of interpretation on 10/19/2014 at 4:10 pm to Dr. Irine Seal, who verbally acknowledged these results.   Electronically Signed   By: Vinnie Langton M.D.   On: 10/19/2014 16:10   US Renal  10/19/2014   CLINICAL DATA:  Hematuria and left flank pain for 4 days.  EXAM: RENAL/URINARY TRACT ULTRASOUND COMPLETE  COMPARISON:  CT abdomen and pelvis 10/16/2014  FINDINGS: Right Kidney:  Length: 10.1 cm. Echogenicity within normal limits. No mass or hydronephrosis visualized.  Left Kidney:  Length: 11.3 cm. Echogenicity within normal limits. No mass visualized. At most minimal fullness of the intrarenal collecting system without hydronephrosis.  Bladder:  Foley catheter in place with decompressed bladder.  Incidental note is made of increased liver echogenicity, incompletely evaluated but suggestive of mild hepatic steatosis.  IMPRESSION: No hydronephrosis.   Electronically Signed   By: Logan Bores   On: 10/19/2014 11:33   Dg Chest Port 1 View  10/21/2014   CLINICAL DATA:  Sudden onset of shortness of breath, heaviness in chest.  EXAM: PORTABLE CHEST - 1 VIEW  COMPARISON:  10/19/2014  FINDINGS: Lung volumes remain low. Patient is post median sternotomy. Development of pulmonary edema and left pleural effusion. Cardiomediastinal contours are unchanged. There is linear atelectasis in the left midlung zone. No pneumothorax. No acute osseous abnormality.  IMPRESSION: Development of pulmonary edema and left pleural effusion, consistent with CHF.   Electronically Signed   By: Jeb Levering M.D.   On: 10/21/2014 04:38   Dg Chest Port 1 View  10/19/2014  CLINICAL DATA:  Left-sided pain  EXAM: PORTABLE CHEST - 1 VIEW  COMPARISON:  10/13/2014  FINDINGS: The heart size and mediastinal contours are within normal limits. Both lungs are clear. The  visualized skeletal structures are unremarkable. Postsurgical changes are noted.  IMPRESSION: No active disease.   Electronically Signed   By: Inez Catalina M.D.   On: 10/19/2014 13:32    Anti-infectives: Anti-infectives    Start     Dose/Rate Route Frequency Ordered Stop   10/19/14 1315  cefTRIAXone (ROCEPHIN) 1 g in dextrose 5 % 50 mL IVPB     1 g 100 mL/hr over 30 Minutes Intravenous Every 24 hours 10/19/14 1301        Assessment/Plan:  1 - Gross Hematuria, Left Flank Pain and Hydronephrosis - proceed as planned with led diagnostic ureteroscopy , possible biopsy.   Hold heparin gtt starting now.   Risks including bleeding, infection, non-diagnosis, non-cure, need for staged procedure discussed as well as rare risks such as DVT, PE, MI, CVAT, Damage / loss of kidney / ureter / bladder discussed.  [facility], Tomasz Steeves 10/21/2014

## 2014-10-22 LAB — BASIC METABOLIC PANEL
ANION GAP: 6 (ref 5–15)
BUN: 14 mg/dL (ref 6–23)
CALCIUM: 8.5 mg/dL (ref 8.4–10.5)
CO2: 32 mmol/L (ref 19–32)
Chloride: 102 mmol/L (ref 96–112)
Creatinine, Ser: 0.97 mg/dL (ref 0.50–1.10)
GFR, EST AFRICAN AMERICAN: 74 mL/min — AB (ref >90)
GFR, EST NON AFRICAN AMERICAN: 64 mL/min — AB (ref >90)
Glucose, Bld: 189 mg/dL — ABNORMAL HIGH (ref 70–99)
POTASSIUM: 3.6 mmol/L (ref 3.5–5.1)
Sodium: 140 mmol/L (ref 135–145)

## 2014-10-22 LAB — HEPARIN LEVEL (UNFRACTIONATED)
Heparin Unfractionated: 0.4 IU/mL (ref 0.30–0.70)
Heparin Unfractionated: 0.78 IU/mL — ABNORMAL HIGH (ref 0.30–0.70)
Heparin Unfractionated: 0.79 IU/mL — ABNORMAL HIGH (ref 0.30–0.70)

## 2014-10-22 LAB — CBC
HCT: 30.1 % — ABNORMAL LOW (ref 36.0–46.0)
Hemoglobin: 9.8 g/dL — ABNORMAL LOW (ref 12.0–15.0)
MCH: 29.3 pg (ref 26.0–34.0)
MCHC: 32.6 g/dL (ref 30.0–36.0)
MCV: 90.1 fL (ref 78.0–100.0)
Platelets: 301 10*3/uL (ref 150–400)
RBC: 3.34 MIL/uL — AB (ref 3.87–5.11)
RDW: 14.1 % (ref 11.5–15.5)
WBC: 10.9 10*3/uL — AB (ref 4.0–10.5)

## 2014-10-22 LAB — GLUCOSE, CAPILLARY
Glucose-Capillary: 293 mg/dL — ABNORMAL HIGH (ref 70–99)
Glucose-Capillary: 163 mg/dL — ABNORMAL HIGH (ref 70–99)
Glucose-Capillary: 178 mg/dL — ABNORMAL HIGH (ref 70–99)

## 2014-10-22 LAB — BRAIN NATRIURETIC PEPTIDE: B NATRIURETIC PEPTIDE 5: 169.2 pg/mL — AB (ref 0.0–100.0)

## 2014-10-22 MED ORDER — HEPARIN (PORCINE) IN NACL 100-0.45 UNIT/ML-% IJ SOLN
1600.0000 [IU]/h | INTRAMUSCULAR | Status: DC
  Administered 2014-10-23: 1600 [IU]/h via INTRAVENOUS
  Filled 2014-10-22 (×2): qty 250

## 2014-10-22 NOTE — Progress Notes (Addendum)
ANTICOAGULATION CONSULT NOTE - Follow up  Pharmacy Consult for heparin Indication: AVR  Allergies  Allergen Reactions  . Atorvastatin     REACTION: myalgias Lipitor  . Codeine     REACTION: N/V  . Morphine And Related Nausea And Vomiting  . Niacin     REACTION: flushing  . Percocet [Oxycodone-Acetaminophen] Other (See Comments)    "blisters in mouth took in 1995"   Patient Measurements: weight 82 kg , height 62 inches Heparin weight: 68 kg Height: 5\' 2"  (157.5 cm) Weight: 176 lb 8 oz (80.06 kg) IBW/kg (Calculated) : 50.1  Vital Signs: Temp: 97.8 F (36.6 C) (03/12 0518) Temp Source: Oral (03/12 0518) BP: 118/61 mmHg (03/12 0518) Pulse Rate: 79 (03/12 0518)  Labs:  Recent Labs  10/19/14 0844 10/19/14 1308  10/19/14 1835 10/20/14 0214 10/20/14 0216 10/20/14 1500 10/21/14 0143 10/22/14 0530  HGB 11.0*  --   --   --  11.3*  --   --  9.6* 9.8*  HCT 33.0*  --   --   --  33.8*  --   --  28.7* 30.1*  PLT 281  --   --   --  266  --   --  239 301  LABPROT 19.7*  --   --   --  18.7*  --   --   --   --   INR 1.66*  --   --   --  1.55*  --   --   --   --   HEPARINUNFRC  --   --   < > <0.10*  --  0.45 0.30 0.15* 0.40  CREATININE 0.94  --   --   --  1.14*  --   --  1.05  --   TROPONINI  --  <0.03  --  <0.03  --  <0.03  --   --   --   < > = values in this interval not displayed.  Estimated Creatinine Clearance: 58 mL/min (by C-G formula based on Cr of 1.05).  Assessment: Patient's a 59 y.o F on coumadin PTA for AVR.  She was recently evaluated at [facility] this past Sunday for UTI and hematuria. She was then discharged with Keflex for suspected infection. She now presents to [facility] ED on 3/9 with c/o severe flank pain and hematuria.  Dr. Ralene Bathe stated that hematuria has resolved and ok to bridge with heparin while INR is subtherapeutic.  INR was 3.35 on 10/16/14.  Patient stated that she did not take her Coumadin dose on 3/7 and 3/8. She took 2.5mg  dose the morning PTA. INR today is  1.66.  Cbc ok.  Home regimen: 2.5mg  daily except 5mg  on MWF  Significant events: 3/9: heparin started, first HL <0.1 on 1100units/hr, increased to 1450units/hr.  3/10: HL 0.45 but drawn only ~2.5 hours after the rate change. Cont same. AM labs delayed, due to difficult stick.  HL 0.3, increased to 1500units/hr. 3/11: AM HL 0.15 on 1500units/hr so it was increased to 1900units/hr. Then heparin was turned off this AM on call to OR. CBC ok & no bleeding reported/documented. Cysto/Stent/Biopsy ~ 1600.  Today, 10/21/2014: Heparin resumed at 2400 at rate of 1900 units/hr, 0600 HL is 0.40 units/ml (in range), level drawn from PICC line. CBC low, stable with Plt wnl.  Goal of Therapy:  Heparin level 0.3-0.7 units/ml Monitor platelets by anticoagulation protocol: Yes   Plan:   Continue Heparin at 1900 units/hr  Recheck Heparin level at 1400 today  Daily HL (when levels remain in range),  and CBC   Minda Ditto PharmD Pager [phone] 10/22/2014, 7:51 AM   1530 Addendum  Repeat HL = 0.78 units/ml on 1900 units/hr Level above range  Will reduce rate to 1750 units/hr= 17.5 m/hr  Recheck level at [place], [place] PharmD Pager [phone] 10/22/2014, 3:32 PM

## 2014-10-22 NOTE — Progress Notes (Signed)
[facility] PROGRESS NOTE  [name] MVH:[id number] DOB: March 28, 1956 DOA: 10/19/2014 PCP: Ileana Roup, MD  Assessment/Plan: #1 left flank pain/gross hematuria/hydronephrosis CT of the abdomen and pelvis with left renal pelvic clot versus possible urothelial mass. Patient's gross hematuria improving, however still with bloody urine. Patient has been seen by urology and patient s/p cystoscopy, left retrograde, left ureteroscopy, with biopsy. Patient currently on heparin drip. Coumadin on hold. If H./H remains stable in next 1-2 days and ok with urology will resume coumadin. Urology following and appreciate input and recommendations.  #2 urinary tract infection Patient was on Keflex as outpatient. Urine cultures at that time were negative. Continue IV Rocephin.  #3 hyponatremia  Improved with hydration. Follow.  #4 chest pain Patient denies any further chest pain. Patient states this is her chronic chest pain. Cardiac enzymes negative 3. 2-D echo with EF of 60-65% with no wall motion abnormalities. Aortic valve with moderate stenosis. IV heparin to be held today for procedure. No further cardiac workup needed at this time.  #5 Volume overload vs acute diastolic CHF exac Patient with clinical improvement after diureses. CXR c/w volume overload. Patient given lasix 40mg  IV x 2 with significant clinical improvement. NSL IVF.  #6 acute blood loss anemia/anemia Likely secondary to gross hematuria. Hgb at 9.8 from 9.6 from 11.3. Follow H&H.  #7 status post aortic valve replacement Currently on IV heparin. If hemoglobin remains stable and okay with urology transitioned to Coumadin and full dose Lovenox in the next 1-2 days.   #8 gastroesophageal reflux disease PPI.  #9 prophylaxis PPI for GI prophylaxis. Heparin for DVT prophylaxis.  Code Status: Full Family Communication: Updated patient and husband at bedside. Disposition Plan: Remain  inpatient.   Consultants:  Urology: Dr. Alinda Money 10/19/2014  Procedures:  CT abdomen and pelvis 10/19/2014  Renal ultrasound 10/19/2014  Chest x-ray 10/19/2014, 10/21/14  2-D echo 10/20/2014 Cystoscopy with left retrograde pyelogram with interpretation, left ureteroscopy with ureteroscopic biopsy, insertion of left ureteral stent 6 x 24, Contour, no tether, bladder biopsy with Fulguration. 10/22/14 Dr Tresa Moore  Antibiotics:  IV Rocephin 10/19/2014    HPI/Subjective: Patient still with blood in urine. Patient denies any nausea, or emesis. Patient states SOB improved from early on this morning.  Objective: Filed Vitals:   10/22/14 0518  BP: 118/61  Pulse: 79  Temp: 97.8 F (36.6 C)  Resp: 20    Intake/Output Summary (Last 24 hours) at 10/22/14 1134 Last data filed at 10/22/14 0900  Gross per 24 hour  Intake 1004.95 ml  Output    950 ml  Net  54.95 ml   Filed Weights   10/20/14 0446 10/21/14 0448 10/22/14 0518  Weight: 87.1 kg (192 lb 0.3 oz) 83.235 kg (183 lb 8 oz) 80.06 kg (176 lb 8 oz)    Exam:   General:  NAD  Cardiovascular: RRR with 3/6 SEM  Respiratory: CTAB  Abdomen: Soft, nondistended, positive bowel sounds. Left CVA non tenderness to palpation.  Musculoskeletal: No clubbing cyanosis or edema.  Data Reviewed: Basic Metabolic Panel:  Recent Labs Lab 10/16/14 2158 10/19/14 0844 10/19/14 1308 10/20/14 0214 10/21/14 0143 10/22/14 0530  NA 138 129*  --  136 137 140  K 3.7 3.9  --  4.0 4.0 3.6  CL 105 99  --  104 105 102  CO2 26 22  --  23 24 32  GLUCOSE 143* 218*  --  132* 136* 189*  BUN 11 14  --  10 9 14  CREATININE 0.78 0.94  --  1.14* 1.05 0.97  CALCIUM 9.1 8.2*  --  8.4 8.2* 8.5  MG  --   --  1.8  --   --   --    Liver Function Tests:  Recent Labs Lab 10/19/14 0844  AST 26  ALT 15  ALKPHOS 75  BILITOT 0.5  PROT 7.0  ALBUMIN 3.9   No results for input(s): LIPASE, AMYLASE in the last 168 hours. No results for input(s):  AMMONIA in the last 168 hours. CBC:  Recent Labs Lab 10/16/14 2158 10/19/14 0844 10/20/14 0214 10/21/14 0143 10/22/14 0530  WBC 11.8* 11.6* 15.7* 13.7* 10.9*  NEUTROABS 5.6 7.6  --   --   --   HGB 13.5 11.0* 11.3* 9.6* 9.8*  HCT 40.8 33.0* 33.8* 28.7* 30.1*  MCV 87.9 89.7 88.9 89.1 90.1  PLT 301 281 266 239 301   Cardiac Enzymes:  Recent Labs Lab 10/19/14 1308 10/19/14 1835 10/20/14 0216  TROPONINI <0.03 <0.03 <0.03   BNP (last 3 results)  Recent Labs  10/21/14 1011 10/22/14 0530  BNP 391.7* 169.2*    ProBNP (last 3 results) No results for input(s): PROBNP in the last 8760 hours.  CBG:  Recent Labs Lab 10/21/14 1131 10/21/14 1516 10/21/14 1647 10/21/14 2135 10/22/14 0819  GLUCAP 132* 131* 150* 246* 293*    Recent Results (from the past 240 hour(s))  Urine culture     Status: None   Collection Time: 10/16/14 10:00 PM  Result Value Ref Range Status   Specimen Description URINE, RANDOM  Final   Special Requests NONE  Final   Colony Count   Final    2,000 COLONIES/ML Performed at [facility]    Culture   Final    INSIGNIFICANT GROWTH Performed at [facility]    Report Status 10/18/2014 FINAL  Final  Urine culture     Status: None   Collection Time: 10/19/14 10:20 AM  Result Value Ref Range Status   Specimen Description URINE, CATHETERIZED  Final   Special Requests NONE  Final   Colony Count NO GROWTH Performed at [facility]   Final   Culture NO GROWTH Performed at [facility]   Final   Report Status 10/21/2014 FINAL  Final  Culture, blood (routine x 2)     Status: None (Preliminary result)   Collection Time: 10/20/14  9:37 AM  Result Value Ref Range Status   Specimen Description BLOOD RIGHT HAND  Final   Special Requests BOTTLES DRAWN AEROBIC ONLY 3CC  Final   Culture   Final           BLOOD CULTURE RECEIVED NO GROWTH TO DATE CULTURE WILL BE HELD FOR 5 DAYS BEFORE ISSUING A FINAL NEGATIVE REPORT Note:  Culture results may be compromised due to an inadequate volume of blood received in culture bottles. Performed at [facility]    Report Status PENDING  Incomplete  Surgical PCR screen     Status: None   Collection Time: 10/21/14 12:45 PM  Result Value Ref Range Status   MRSA, PCR NEGATIVE NEGATIVE Final   Staphylococcus aureus NEGATIVE NEGATIVE Final    Comment:        The Xpert SA Assay (FDA approved for NASAL specimens in patients over 75 years of age), is one component of a comprehensive surveillance program.  Test performance has been validated by [facility] for patients greater than or equal to 6 year old. It is  not intended to diagnose infection nor to guide or monitor treatment. Performed at [facility]      Studies: Dg Chest Port 1 View  10/21/2014   CLINICAL DATA:  Sudden onset of shortness of breath, heaviness in chest.  EXAM: PORTABLE CHEST - 1 VIEW  COMPARISON:  10/19/2014  FINDINGS: Lung volumes remain low. Patient is post median sternotomy. Development of pulmonary edema and left pleural effusion. Cardiomediastinal contours are unchanged. There is linear atelectasis in the left midlung zone. No pneumothorax. No acute osseous abnormality.  IMPRESSION: Development of pulmonary edema and left pleural effusion, consistent with CHF.   Electronically Signed   By: Jeb Levering M.D.   On: 10/21/2014 04:38   Dg Chest Port 1v Same Day  10/21/2014   CLINICAL DATA:  Reassess CHF  EXAM: PORTABLE CHEST - 1 VIEW SAME DAY  COMPARISON:  Film from earlier in the same day  FINDINGS: Cardiac shadow is stable. Postoperative changes are again seen. A right-sided PICC line is now noted with the catheter tip in the mid superior vena cava in satisfactory position. Mild left basilar atelectasis is noted. The degree of vascular congestion has improved significantly in the interval from the prior exam. Mild residual changes are noted.  IMPRESSION: Significant improvement in  CHF. Minimal residual atelectasis is noted.   Electronically Signed   By: Inez Catalina M.D.   On: 10/21/2014 10:11    Scheduled Meds: . antiseptic oral rinse  7 mL Mouth Rinse BID  . aspirin  81 mg Oral Daily  . cefTRIAXone (ROCEPHIN)  IV  1 g Intravenous Q24H  . docusate sodium  100 mg Oral BID  . fesoterodine  4 mg Oral Daily  . insulin aspart  0-20 Units Subcutaneous TID WC  . loratadine  10 mg Oral Daily  . metoprolol tartrate  25 mg Oral BID  . rosuvastatin  20 mg Oral Daily  . sodium chloride  10-40 mL Intracatheter Q12H  . sodium chloride  3 mL Intravenous Q12H   Continuous Infusions: . heparin 1,900 Units/hr (10/22/14 1058)    Principal Problem:   Acute left flank pain Active Problems:   Anxiety state   Aortic valve disorder   GERD   S/P AVR (aortic valve replacement)   Chest pain   UTI (lower urinary tract infection)   Hyponatremia   Anemia   Acute blood loss anemia   Acute hyponatremia   Hypervolemia    Time spent: Ozora MD [facility] Pager [phone]. If 7PM-7AM, please contact night-coverage at [email or web address], password [email or web address] 10/22/2014, 11:34 AM  LOS: 3 days

## 2014-10-22 NOTE — Op Note (Signed)
[name], [name] NO.:  [id number]  MEDICAL RECORD NO.:  [id number]  LOCATION:  5053                         FACILITY:  [facility]  PHYSICIAN:  Alexis Frock, MD     DATE OF BIRTH:  Dec 04, 1955  DATE OF PROCEDURE:  10/21/2014                               OPERATIVE REPORT   PREOPERATIVE DIAGNOSES:  Recurrent gross hematuria, left hydronephrosis with clot versus tumor in the left renal pelvis.  POSTOPERATIVE DIAGNOSES:  Recurrent gross hematuria, left hydronephrosis with clot versus tumor in the left renal pelvis.  PROCEDURE:  Cystoscopy with left retrograde pyelogram with interpretation, left ureteroscopy with ureteroscopic biopsy, insertion of left ureteral stent 6 x 24, Contour, no tether, bladder biopsy with fulguration.  ESTIMATED BLOOD LOSS:  Nil.  COMPLICATIONS:  None.  SPECIMENS: 1. Bladder erythema. 2. Renal pelvis erythema on the left. 3. Left renal pelvis tissue with clot.  FINDINGS: 1. Small amount of bladder erythema possibly consistent with carcinoma     in situ versus prior instrumentation, this was medial to the left     ureteral orifice. 2. Formed clot at the left ureteral orifice likely obstructing. 3. Mild hydroureteronephrosis to the level of the bladder. 4. Impressive renal pelvis mucosal erythema. 5. Large burden renal pelvis clot.  There was some tissue admixed with     this.  No frank papillary lesions visualized.  INDICATIONS:  [name] is a very pleasant 59 year old lady with history of aortic stenosis status post aortic valve replacement with chronic anticoagulation.  She developed gross hematuria that was recurrent and also some left flank pain.  She underwent procedure in the midst of evaluation for this when she developed acute left flank pain. CT imaging revealed questionable mass versus clot within the renal pelvis in the left and hydronephrosis to further her diagnostic workup. It was clearly felt that the left  ureteroscopy with possible biopsy was warranted.  The patient has been off Coumadin with heparin bridging and also it was felt that this is an opportunity time to proceed with this, informed consent was obtained and placed in medical record.  PROCEDURE IN DETAIL:  The patient being [name] verified. Procedure being left ureteroscopy with biopsy was confirmed.  Procedure was carried out.  Time-out was performed.  Intravenous antibiotics were administered.  General LMA anesthesia was introduced.  The patient was placed into a low lithotomy position.  Sterile field was created by prepping and draping the patient's vagina, introitus, and proximal thighs using iodine x3.  Next, cystourethroscopy was performed using 21- French rigid cystoscope with 30-degree offset lens and using normal saline irrigation the bladder was inspected, this revealed a formed clot at the left ureteral orifice.  The right ureteral orifice was unremarkable.  There was an approximately quarter-sized area of erythema just superior medial to the left ureteral orifice, which was felt to be consistent with possible prior instrumentation versus carcinoma in situ. We felt that this would clearly need to be biopsied later in the procedure and left ureteral orifice was cannulated with a 6-French end- hold catheter and left retrograde pyelogram was obtained.  Left retrograde pyelogram demonstrated single left ureter, single system left kidney.  There is hydroureteronephrosis to the level of the ureterovesical junction.  There is large amount of filling defect with some mild extravasation in the renal pelvis consistent with likely intraluminal mass versus clot.  A 0.038 zip wire was advanced to the level of the upper pole and set aside as a safety wire.  An 8-French feeding tube was placed in the urinary bladder for pressure release. Next, semi-rigid ureteroscopy was performed through entire length of left ureter alongside  a separate Sensor working wire using dual channel 6.5-French semi-rigid ureteroscope, which revealed some small volume of formed clot within the ureter.  No mucosal abnormalities.  The semi- rigid ureteroscope was then exchanged for the 12/14, 24 cm ureteral access sheath, which was placed under continuous fluoroscopic vision to the level of proximal ureter.  Next, flexible digital ureteroscopy was performed using dual channel flexible digital ureteroscope, this allowed pan endoscopic examination of the left kidney including all calices x2. There was significant amount of formed clot within the renal pelvis as expected and several calices.  There was some questionable tissue admixed with this, it was not frankly papillary.  There was also had background impressive erythema to more surfaces of the renal pelvis. This was worrisome for possible carcinoma in situ versus even infectious etiology.  I felt biopsy of these structures was clearly warranted.  As such, an escape-type basket was used to grasp representative areas of the admixed clot with tissue and these small fragments were carefully set aside for permanent pathology, labeled left renal pelvis tissue. Next, a BIGopsy apparatus was carefully back loaded via the ureteroscope and under ureteroscopic vision.  Several representative areas of the renal pelvis mucosal erythema were biopsied, and these were also set aside for permanent pathology, labeled left renal pelvis erythema. Following these maneuvers, there was acceptable hemostasis.  No evidence of gross perforation.  It was clearly felt that left ureteral stenting would be warranted to help allow left renal decompression and resolution of formed clot in the renal pelvis.  The access sheath was removed under continuous ureteroscopic vision, no mucosal abnormalities were found.  A new 6 x 24 Contour stent was then placed using cystoscopic and fluoroscopic guidance.  Good proximal and  distal deployment were noted. Attention was then directed at bladder biopsy.  Irrigation was switched to sterile water.  Using cold cup biopsy forceps, the aforementioned urinary bladder erythema was carefully biopsied x3.  These small tissue fragments were set aside for permanent pathology.  The base of this was cauterized with Bugbee electrode, which resulted in excellent hemostasis.  There was no evidence of bladder perforation.  The stent was in good position.  There was a small amount of expected bloody urine seen around into the distal end of the stent.  This was nonpulsatile.  I felt that catheterization was not warranted.  Bladder was emptied per cystoscope.  Procedure was then terminated.  The patient tolerated the procedure well.  There were no immediate periprocedural complications. The patient was taken to the postanesthesia care unit in stable condition.          ______________________________ Alexis Frock, MD     TM/MEDQ  D:  10/21/2014  T:  10/22/2014  Job:  [id number]

## 2014-10-22 NOTE — Progress Notes (Signed)
PHARMACY - HEPARIN (brief note)  Pharmacy dosing heparin for AVR.  MD notes hematuria is improving.  Heparin drip currently infusing @ 1750 units/hr Heparin level = 0.79 (goal 0.3-0.7)  Plan:  Decrease heparin to 1600 units/hr            Recheck heparin level 6 hr after rate decrease  Leone Haven, PharmD 10/22/14 @ 23:57

## 2014-10-23 ENCOUNTER — Inpatient Hospital Stay (HOSPITAL_COMMUNITY): Payer: Commercial Managed Care - PPO

## 2014-10-23 DIAGNOSIS — I359 Nonrheumatic aortic valve disorder, unspecified: Secondary | ICD-10-CM

## 2014-10-23 DIAGNOSIS — N1330 Unspecified hydronephrosis: Secondary | ICD-10-CM | POA: Insufficient documentation

## 2014-10-23 DIAGNOSIS — Z954 Presence of other heart-valve replacement: Secondary | ICD-10-CM

## 2014-10-23 DIAGNOSIS — R1012 Left upper quadrant pain: Secondary | ICD-10-CM

## 2014-10-23 LAB — GLUCOSE, CAPILLARY
GLUCOSE-CAPILLARY: 258 mg/dL — AB (ref 70–99)
Glucose-Capillary: 153 mg/dL — ABNORMAL HIGH (ref 70–99)
Glucose-Capillary: 150 mg/dL — ABNORMAL HIGH (ref 70–99)
Glucose-Capillary: 106 mg/dL — ABNORMAL HIGH (ref 70–99)
Glucose-Capillary: 180 mg/dL — ABNORMAL HIGH (ref 70–99)
Glucose-Capillary: 133 mg/dL — ABNORMAL HIGH (ref 70–99)

## 2014-10-23 LAB — CBC
HEMATOCRIT: 27.1 % — AB (ref 36.0–46.0)
Hemoglobin: 8.9 g/dL — ABNORMAL LOW (ref 12.0–15.0)
MCH: 29.7 pg (ref 26.0–34.0)
MCHC: 32.8 g/dL (ref 30.0–36.0)
MCV: 90.3 fL (ref 78.0–100.0)
Platelets: 286 10*3/uL (ref 150–400)
RBC: 3 MIL/uL — AB (ref 3.87–5.11)
RDW: 14.3 % (ref 11.5–15.5)
WBC: 14.4 10*3/uL — ABNORMAL HIGH (ref 4.0–10.5)

## 2014-10-23 LAB — BASIC METABOLIC PANEL
Anion gap: 10 (ref 5–15)
BUN: 20 mg/dL (ref 6–23)
CALCIUM: 8.2 mg/dL — AB (ref 8.4–10.5)
CO2: 28 mmol/L (ref 19–32)
Chloride: 101 mmol/L (ref 96–112)
Creatinine, Ser: 1.1 mg/dL (ref 0.50–1.10)
GFR calc Af Amer: 63 mL/min — ABNORMAL LOW (ref >90)
GFR calc non Af Amer: 55 mL/min — ABNORMAL LOW (ref >90)
Glucose, Bld: 171 mg/dL — ABNORMAL HIGH (ref 70–99)
Potassium: 3.1 mmol/L — ABNORMAL LOW (ref 3.5–5.1)
Sodium: 139 mmol/L (ref 135–145)

## 2014-10-23 LAB — HEMOGLOBIN AND HEMATOCRIT, BLOOD
HCT: 26.6 % — ABNORMAL LOW (ref 36.0–46.0)
HEMATOCRIT: 31.1 % — AB (ref 36.0–46.0)
HEMOGLOBIN: 8.7 g/dL — AB (ref 12.0–15.0)
HEMOGLOBIN: 9.9 g/dL — AB (ref 12.0–15.0)

## 2014-10-23 LAB — HEPARIN LEVEL (UNFRACTIONATED)
Heparin Unfractionated: 0.71 IU/mL — ABNORMAL HIGH (ref 0.30–0.70)
Heparin Unfractionated: 0.19 IU/mL — ABNORMAL LOW (ref 0.30–0.70)

## 2014-10-23 MED ORDER — BELLADONNA ALKALOIDS-OPIUM 16.2-60 MG RE SUPP
1.0000 | Freq: Three times a day (TID) | RECTAL | Status: DC | PRN
  Administered 2014-10-23 (×2): 1 via RECTAL
  Filled 2014-10-23 (×2): qty 1

## 2014-10-23 MED ORDER — HEPARIN (PORCINE) IN NACL 100-0.45 UNIT/ML-% IJ SOLN
1500.0000 [IU]/h | INTRAMUSCULAR | Status: DC
  Filled 2014-10-23: qty 250

## 2014-10-23 MED ORDER — ACETAMINOPHEN 10 MG/ML IV SOLN
1000.0000 mg | Freq: Four times a day (QID) | INTRAVENOUS | Status: AC
  Administered 2014-10-23 – 2014-10-24 (×4): 1000 mg via INTRAVENOUS
  Filled 2014-10-23 (×4): qty 100

## 2014-10-23 MED ORDER — POTASSIUM CHLORIDE CRYS ER 20 MEQ PO TBCR
40.0000 meq | EXTENDED_RELEASE_TABLET | ORAL | Status: AC
  Administered 2014-10-23 (×2): 40 meq via ORAL
  Filled 2014-10-23 (×2): qty 2

## 2014-10-23 NOTE — Progress Notes (Signed)
ANTICOAGULATION CONSULT NOTE - Follow up  Pharmacy Consult for heparin Indication: AVR  Allergies  Allergen Reactions  . Atorvastatin     REACTION: myalgias Lipitor  . Codeine     REACTION: N/V  . Morphine And Related Nausea And Vomiting  . Niacin     REACTION: flushing  . Percocet [Oxycodone-Acetaminophen] Other (See Comments)    "blisters in mouth took in 1995"   Patient Measurements: weight 82 kg , height 62 inches Heparin weight: 68 kg Height: 5\' 2"  (157.5 cm) Weight: 179 lb (81.194 kg) IBW/kg (Calculated) : 50.1  Vital Signs: Temp: 98.1 F (36.7 C) (03/13 0504) Temp Source: Oral (03/13 0504) BP: 125/64 mmHg (03/13 0504) Pulse Rate: 68 (03/13 0504)  Labs:  Recent Labs  10/21/14 0143 10/22/14 0530 10/22/14 1420 10/22/14 2310 10/23/14 0145 10/23/14 0600  HGB 9.6* 9.8*  --   --  8.7* 8.9*  HCT 28.7* 30.1*  --   --  26.6* 27.1*  PLT 239 301  --   --   --  286  HEPARINUNFRC 0.15* 0.40 0.78* 0.79*  --  0.71*  CREATININE 1.05 0.97  --   --   --  1.10   Estimated Creatinine Clearance: 55.7 mL/min (by C-G formula based on Cr of 1.1).  Assessment: 59 y.o F on coumadin PTA for AVR.  She was recently evaluated at [facility] this past Sunday for UTI and hematuria. She was then discharged with Keflex for suspected infection. She now presents to [facility] ED on 3/9 with c/o severe flank pain and hematuria.  Dr. Ralene Bathe stated that hematuria has resolved and ok to bridge with heparin while INR is subtherapeutic.  INR was 3.35 on 10/16/14.  Patient stated that she did not take her Coumadin dose on 3/7 and 3/8. She took 2.5mg  dose the morning PTA. INR today is 1.66.  Cbc ok.  Home regimen: 2.5mg  daily except 5mg  on MWF  Significant events: 3/9: heparin started, first HL <0.1 on 1100units/hr, increased to 1450units/hr.  3/10: HL 0.45 but drawn only ~2.5 hours after the rate change. Cont same. AM labs delayed, due to difficult stick.  HL 0.3, increased to 1500units/hr. 3/11: AM HL 0.15 on  1500units/hr so it was increased to 1900units/hr. Then heparin was turned off this AM on call to OR.  Cysto/Stent/Biopsy ~ 1600. CBC ok & no bleeding reported/documented. 3/12: Heparin resumed at 0030 at rate of 1900 units/hr, 0600 HL is 0.40 units/ml (in range), level drawn from PICC line. CBC low, stable with Plt wnl. Repeat Hep levels drawn off PICC line elevated, rate reduction x2  Today, 10/23/2014: HL this am still elevated at 0.71 units/ml on 1600 units/hr. Increased hematuria with clots overnight.   Goal of Therapy:  Heparin level 0.3-0.7 units/ml Monitor platelets by anticoagulation protocol: Yes   Plan:   Reduce Heparin to 1500 units/hr  Recheck Heparin level at 1600 today  Daily HL (when levels remain in range),  and CBC   Minda Ditto PharmD Pager [phone] 10/23/2014, 7:50 AM

## 2014-10-23 NOTE — Progress Notes (Signed)
Patient is still in pain after a B&O supp., and tylenol. I suggested to take Tramadol and she didn't want to due to pain meds making her sick. Will continue to monitor patient.

## 2014-10-23 NOTE — Consult Note (Signed)
Urology Inpatient Progress Report S/p cystoscopy, left ureteroscopy/stent placement for left hydronephrosis  Intv/Subj: Great day yesterday Developed renal colic again last night, passing clots.  Pain similar to initial presentation to ED last Sunday. No fevers  Past Medical History  Diagnosis Date  . Hyperlipidemia   . Multinodular thyroid   . GERD (gastroesophageal reflux disease)   . Hx of hysterectomy   . Palpitation   . Aortic stenosis   . Heart murmur     aortic stenosis - dr. Burt Knack is her cardiologist  . Angina     chest tightness and pressure due to aortic stenosis  . Shortness of breath     associated with as  . Diabetes mellitus     newly diagnosed in july, 2012  . Headache(784.0)     migraines when younger  . PONV (postoperative nausea and vomiting)    Current Facility-Administered Medications  Medication Dose Route Frequency Provider Last Rate Last Dose  . acetaminophen (OFIRMEV) IV 1,000 mg  1,000 mg Intravenous 4 times per day Ardis Hughs, MD      . acetaminophen (TYLENOL) tablet 650 mg  650 mg Oral Q6H PRN Eugenie Filler, MD   650 mg at 10/23/14 0636  . albuterol (PROVENTIL) (2.5 MG/3ML) 0.083% nebulizer solution 2.5 mg  2.5 mg Nebulization Q2H PRN Eugenie Filler, MD      . alum & mag hydroxide-simeth (MAALOX/MYLANTA) 200-200-20 MG/5ML suspension 30 mL  30 mL Oral Q6H PRN Eugenie Filler, MD      . antiseptic oral rinse (CPC / CETYLPYRIDINIUM CHLORIDE 0.05%) solution 7 mL  7 mL Mouth Rinse BID Eugenie Filler, MD   7 mL at 10/23/14 1000  . aspirin chewable tablet 81 mg  81 mg Oral Daily Eugenie Filler, MD   81 mg at 10/22/14 0931  . cefTRIAXone (ROCEPHIN) 1 g in dextrose 5 % 50 mL IVPB  1 g Intravenous Q24H Eugenie Filler, MD 100 mL/hr at 10/22/14 1353 1 g at 10/22/14 1353  . docusate sodium (COLACE) capsule 100 mg  100 mg Oral BID Eugenie Filler, MD   100 mg at 10/23/14 0913  . fesoterodine (TOVIAZ) tablet 4 mg  4 mg Oral Daily Eugenie Filler, MD   4 mg at 10/19/14 2240  . gi cocktail (Maalox,Lidocaine,Donnatal)  30 mL Oral TID PRN Eugenie Filler, MD      . heparin ADULT infusion 100 units/mL (25000 units/250 mL)  1,600 Units/hr Intravenous Continuous [name], RPH 16 mL/hr at 10/23/14 0745 1,600 Units/hr at 10/23/14 0745  . HYDROmorphone (DILAUDID) injection 1 mg  1 mg Intravenous Q4H PRN Irine Seal V, MD      . insulin aspart (novoLOG) injection 0-20 Units  0-20 Units Subcutaneous TID WC Eugenie Filler, MD   4 Units at 10/23/14 0911  . ipratropium (ATROVENT) nebulizer solution 0.5 mg  0.5 mg Nebulization Q2H PRN Eugenie Filler, MD      . ketorolac (TORADOL) 30 MG/ML injection 30 mg  30 mg Intravenous Q6H PRN Eugenie Filler, MD      . loratadine (CLARITIN) tablet 10 mg  10 mg Oral Daily Eugenie Filler, MD   10 mg at 10/20/14 1000  . metoprolol tartrate (LOPRESSOR) tablet 25 mg  25 mg Oral BID Eugenie Filler, MD   25 mg at 10/23/14 0913  . ondansetron (ZOFRAN) tablet 8 mg  8 mg Oral Q6H PRN Eugenie Filler, MD  Or  . ondansetron (ZOFRAN) 8 mg/NS 50 ml IVPB  8 mg Intravenous Q6H PRN Eugenie Filler, MD   8 mg at 10/20/14 0027  . opium-belladonna (B&O SUPPRETTES) 16.2-60 MG suppository 1 suppository  1 suppository Rectal Q8H PRN Ardis Hughs, MD   1 suppository at 10/23/14 [phone]  . potassium chloride SA (K-DUR,KLOR-CON) CR tablet 40 mEq  40 mEq Oral Q4H Eugenie Filler, MD   40 mEq at 10/23/14 0912  . rosuvastatin (CRESTOR) tablet 20 mg  20 mg Oral Daily Eugenie Filler, MD   20 mg at 10/23/14 0913  . senna-docusate (Senokot-S) tablet 1 tablet  1 tablet Oral QHS PRN Irine Seal V, MD      . sodium chloride 0.9 % injection 10-40 mL  10-40 mL Intracatheter Q12H Eugenie Filler, MD   10 mL at 10/21/14 1019  . sodium chloride 0.9 % injection 10-40 mL  10-40 mL Intracatheter PRN Eugenie Filler, MD   10 mL at 10/23/14 6283  . sodium chloride 0.9 % injection 3 mL  3 mL  Intravenous Q12H Eugenie Filler, MD   3 mL at 10/21/14 2207  . sorbitol 70 % solution 30 mL  30 mL Oral Daily PRN Eugenie Filler, MD      . traMADol Veatrice Bourbon) tablet 100 mg  100 mg Oral Q6H PRN Eugenie Filler, MD   100 mg at 10/20/14 0109     Objective: Vital: Filed Vitals:   10/22/14 0518 10/22/14 1322 10/22/14 2027 10/23/14 0504  BP: 118/61 109/50 117/48 125/64  Pulse: 79 93 90 68  Temp: 97.8 F (36.6 C) 98.2 F (36.8 C) 98.1 F (36.7 C) 98.1 F (36.7 C)  TempSrc: Oral Oral Oral Oral  Resp: 20 20 18 18   Height:      Weight: 80.06 kg (176 lb 8 oz)   81.194 kg (179 lb)  SpO2: 98% 95% 92% 96%   I/Os: I/O last 3 completed shifts: In: 1006 [P.O.:600; I.V.:356; IV Piggyback:50] Out: 1400 [Urine:1400]  Physical Exam:  General: Patient is in no apparent distress Lungs: Normal respiratory effort, chest expands symmetrically. GI: The abdomen is soft and nontender without mass. Ext: lower extremities symmetric  Lab Results:  Recent Labs  10/21/14 0143 10/22/14 0530 10/23/14 0145 10/23/14 0600  WBC 13.7* 10.9*  --  14.4*  HGB 9.6* 9.8* 8.7* 8.9*  HCT 28.7* 30.1* 26.6* 27.1*    Recent Labs  10/21/14 0143 10/22/14 0530 10/23/14 0600  NA 137 140 139  K 4.0 3.6 3.1*  CL 105 102 101  CO2 24 32 28  GLUCOSE 136* 189* 171*  BUN 9 14 20   CREATININE 1.05 0.97 1.10  CALCIUM 8.2* 8.5 8.2*   No results for input(s): LABPT, INR in the last 72 hours. No results for input(s): LABURIN in the last 72 hours. Results for orders placed or performed during the hospital encounter of 10/19/14  Urine culture     Status: None   Collection Time: 10/19/14 10:20 AM  Result Value Ref Range Status   Specimen Description URINE, CATHETERIZED  Final   Special Requests NONE  Final   Colony Count NO GROWTH Performed at [facility]   Final   Culture NO GROWTH Performed at [facility]   Final   Report Status 10/21/2014 FINAL  Final  Culture, blood (routine x 2)      Status: None (Preliminary result)   Collection Time: 10/20/14  9:37 AM  Result Value  Ref Range Status   Specimen Description BLOOD RIGHT HAND  Final   Special Requests BOTTLES DRAWN AEROBIC ONLY 3CC  Final   Culture   Final           BLOOD CULTURE RECEIVED NO GROWTH TO DATE CULTURE WILL BE HELD FOR 5 DAYS BEFORE ISSUING A FINAL NEGATIVE REPORT Note: Culture results may be compromised due to an inadequate volume of blood received in culture bottles. Performed at [facility]    Report Status PENDING  Incomplete  Surgical PCR screen     Status: None   Collection Time: 10/21/14 12:45 PM  Result Value Ref Range Status   MRSA, PCR NEGATIVE NEGATIVE Final   Staphylococcus aureus NEGATIVE NEGATIVE Final    Comment:        The Xpert SA Assay (FDA approved for NASAL specimens in patients over 69 years of age), is one component of a comprehensive surveillance program.  Test performance has been validated by [facility] for patients greater than or equal to 79 year old. It is not intended to diagnose infection nor to guide or monitor treatment. Performed at [facility]     Studies/Results:   Assessment: 2 Days Post-Op s/p left ureteroscopy and stent placement with recurrent flank pain.  May have obstructed her stent from on-going bleeding in her renal pelvis.  Plan: ? Stop heparin drip for 24 hours? - defer to medicine Renal Ultrasound today IV Tylenol - intolerant to narcotics Voiding okay- no need for foley at this point. NPO - options if she's obstructed include ureteral stent exchange/placement of second stent, nephrostomy tube, conservative management with pain meds/stopping anticoagulation.  Ardis Hughs 10/23/2014, 9:43 AM

## 2014-10-23 NOTE — Consult Note (Signed)
CARDIOLOGY CONSULT NOTE       Patient ID: [name] MRN: [id number] DOB/AGE: 57/26/1957 56 y.o.  Admit date: 10/19/2014 Referring Physician:  Grandville Silos Primary Physician: Ileana Roup, MD Primary Cardiologist:  Burt Knack Reason for Consultation:  AVR and hematuria  Principal Problem:   Acute left flank pain Active Problems:   Anxiety state   Aortic valve disorder   GERD   S/P AVR (aortic valve replacement)   Chest pain   UTI (lower urinary tract infection)   Hyponatremia   Anemia   Acute blood loss anemia   Acute hyponatremia   Hypervolemia   Hydronephrosis of left kidney   HPI:   59 y.o. with AVR 2012 for bicuspid AV.  Had a small 19 mm Sorin mechanical valve placed by PVT.  Gradients have been high due to prosthetic - patient mismatch.  08/2012  Mean gradient 40 mmHg And TEE done 2/14 showed normal valve function with no pannus or thrombus.  Admitted with gross hematuria s/p cystolscopy with left uretal stenting.  Still with left flank pain Cysoscopy with extensive  Clot in pelvix and mucosal erythema.  On antibiotics  Plan for possible uretal stent exchange  [place] pending for obstruction She has not been off coumadin before this event   ROS All other systems reviewed and negative except as noted above  Past Medical History  Diagnosis Date  . Hyperlipidemia   . Multinodular thyroid   . GERD (gastroesophageal reflux disease)   . Hx of hysterectomy   . Palpitation   . Aortic stenosis   . Heart murmur     aortic stenosis - dr. Burt Knack is her cardiologist  . Angina     chest tightness and pressure due to aortic stenosis  . Shortness of breath     associated with as  . Diabetes mellitus     newly diagnosed in july, 2012  . Headache(784.0)     migraines when younger  . PONV (postoperative nausea and vomiting)     Family History  Problem Relation Age of Onset  . Hypertension Other   . Coronary artery disease Father   . COPD Father   . Diabetes Mother   .  Cancer Mother 17    bile duct cancer    History   Social History  . Marital Status: Married    Spouse Name: N/A  . Number of Children: N/A  . Years of Education: N/A   Occupational History  . Not on file.   Social History Main Topics  . Smoking status: Former Smoker -- 1.00 packs/day for 15 years    Types: Cigarettes    Quit date: 08/12/1994  . Smokeless tobacco: Never Used  . Alcohol Use: No  . Drug Use: No  . Sexual Activity: Not on file   Other Topics Concern  . Not on file   Social History Narrative   Married - one daughter 18 y/o   Occupation: Employed as Banker for [employer]    Past Surgical History  Procedure Laterality Date  . Cesarean section    . Bunionectomy    . Partial hysterectomy    . Abdominal hysterectomy    . Aortic valve replacement  06/25/2011    Procedure: AORTIC VALVE REPLACEMENT (AVR);  Surgeon: Tharon Aquas Adelene Idler, MD;  Location: [facility];  Service: Open Heart Surgery;  Laterality: N/A;  . Cardiac valve replacement      Aortic Valve   06/25/2011  . Tee without cardioversion N/A 09/18/2012  Procedure: TRANSESOPHAGEAL ECHOCARDIOGRAM (TEE);  Surgeon: Fay Records, MD;  Location: [facility];  Service: Cardiovascular;  Laterality: N/A;     . acetaminophen  1,000 mg Intravenous 4 times per day  . antiseptic oral rinse  7 mL Mouth Rinse BID  . aspirin  81 mg Oral Daily  . cefTRIAXone (ROCEPHIN)  IV  1 g Intravenous Q24H  . docusate sodium  100 mg Oral BID  . fesoterodine  4 mg Oral Daily  . insulin aspart  0-20 Units Subcutaneous TID WC  . loratadine  10 mg Oral Daily  . metoprolol tartrate  25 mg Oral BID  . rosuvastatin  20 mg Oral Daily  . sodium chloride  10-40 mL Intracatheter Q12H  . sodium chloride  3 mL Intravenous Q12H   . heparin 1,600 Units/hr (10/23/14 0745)    Physical Exam: Blood pressure 125/64, pulse 68, temperature 98.1 F (36.7 C), temperature source Oral, resp. rate 18, height 5\' 2"  (1.575 m), weight 81.194 kg (179 lb), SpO2  96 %.   Affect appropriate Obese white female  HEENT: normal Neck supple with no adenopathy JVP normal no bruits no thyromegaly Lungs clear with no wheezing and good diaphragmatic motion Heart:  W2/X9 click with SEM no AR  murmur, no rub, gallop or click PMI normal Abdomen: benighn, BS positve, no tenderness, no AAA no bruit.  No HSM or HJR Distal pulses intact with no bruits No edema Neuro non-focal Skin warm and dry No muscular weakness   Labs:   Lab Results  Component Value Date   WBC 14.4* 10/23/2014   HGB 8.9* 10/23/2014   HCT 27.1* 10/23/2014   MCV 90.3 10/23/2014   PLT 286 10/23/2014    Recent Labs Lab 10/19/14 0844  10/23/14 0600  NA 129*  < > 139  K 3.9  < > 3.1*  CL 99  < > 101  CO2 22  < > 28  BUN 14  < > 20  CREATININE 0.94  < > 1.10  CALCIUM 8.2*  < > 8.2*  PROT 7.0  --   --   BILITOT 0.5  --   --   ALKPHOS 75  --   --   ALT 15  --   --   AST 26  --   --   GLUCOSE 218*  < > 171*  < > = values in this interval not displayed. Lab Results  Component Value Date   TROPONINI <0.03 10/20/2014    Lab Results  Component Value Date   CHOL 163 03/03/2013   CHOL 192 09/02/2012   CHOL 209* 03/04/2012   Lab Results  Component Value Date   HDL 46.20 03/03/2013   HDL 50.60 09/02/2012   HDL 58.20 03/04/2012   Lab Results  Component Value Date   LDLCALC 91 03/18/2011   LDLCALC 93 02/23/2008   LDLCALC 90 09/18/2007   Lab Results  Component Value Date   TRIG 238.0* 03/03/2013   TRIG 253.0* 09/02/2012   TRIG 290.0* 03/04/2012   Lab Results  Component Value Date   CHOLHDL 4 03/03/2013   CHOLHDL 4 09/02/2012   CHOLHDL 4 03/04/2012   Lab Results  Component Value Date   LDLDIRECT 93.3 03/03/2013   LDLDIRECT 105.8 09/02/2012   LDLDIRECT 118.9 03/04/2012      Radiology: Ct Abdomen Pelvis W Wo Contrast  10/19/2014   CLINICAL DATA:  59 year old female with recent history of urinary tract infection, recently passing blood clots in her urine.  Abdominal pain since yesterday. Severe left flank pain since this morning. Unable to urinate.  EXAM: CT ABDOMEN AND PELVIS WITHOUT AND WITH CONTRAST  TECHNIQUE: Multidetector CT imaging of the abdomen and pelvis was performed following the standard protocol before and following the bolus administration of intravenous contrast.  CONTRAST:  113mL OMNIPAQUE IOHEXOL 300 MG/ML  SOLN  COMPARISON:  CT of the abdomen and pelvis 10/16/2014.  FINDINGS: Lower chest: Mild dependent atelectasis in the lower lobes of the lungs bilaterally (left greater than right). Mechanical aortic valve. Status post median sternotomy.  Hepatobiliary: Mild diffuse hepatic steatosis, with area of more significant focal fatty infiltration in the central aspect of segment 4B of the liver, and adjacent to the falciform ligament anteriorly in segment 4B, similar to the prior examination. There is an additional hypovascular lesion measuring 1 cm in the periphery of segment 6 (image 30 of series 6), which is unchanged in size compared to prior study 04/15/2012, and although technically indeterminate, considered benign. No other suspicious appearing hepatic lesions are noted. No intra or extrahepatic biliary ductal dilatation. Gallbladder is normal in appearance.  Pancreas: Unremarkable.  Spleen: Unremarkable.  Adrenals/Urinary Tract: No calcifications are identified within the collecting system of either kidney, along the course of either ureter, or within the lumen of the urinary bladder. Right kidney is normal in appearance. New moderate left-sided hydronephrosis. Notably, the left renal pelvis, most proximal aspect of the left ureter, and the lower pole collecting system of the left kidney contain high attenuation material (45 Hounsfield units), favored to represent blood. Extensive left-sided perinephric stranding. Generalized hypoperfusion throughout the renal parenchyma of the left kidney as compared to the right, without frank striated  nephrogram. Delayed images demonstrate no significant excretion of contrast material by the left kidney. Normal right-sided excretion is noted. Urinary bladder is generally normal in appearance, with exception of a nondependent locule of gas, presumably iatrogenic related to recent catheterization.  Stomach/Bowel: Normal appearance of the stomach. No pathologic dilatation of small bowel or colon. Normal appendix.  Vascular/Lymphatic: Atherosclerosis throughout the abdominal and pelvic vasculature, without evidence of aneurysm or dissection. Left renal artery and vein are well visualized and are both widely patent. Numerous nonenlarged retroperitoneal lymph nodes are noted, likely reactive. No pathologically enlarged lymph nodes identified in the abdomen or pelvis.  Reproductive: Status post hysterectomy.  Ovaries are atrophic.  Other: No significant volume of ascites.  No pneumoperitoneum.  Musculoskeletal: There are no aggressive appearing lytic or blastic lesions noted in the visualized portions of the skeleton.  IMPRESSION: 1. Interval development of moderate left-sided hydronephrosis and extensive left-sided perinephric stranding, which is likely related to obstructing clot in the left renal collecting system and proximal left ureter. There is generalized hypoperfusion of the left kidney, which is favored to be related to this obstruction. No frank striated nephrogram is identified at this time to strongly suggest pyelonephritis, although correlation with urinalysis is recommended. 2. No urinary tract calculi. 3. Normal appendix. 4. Additional incidental findings, as above. These results were called by telephone at the time of interpretation on 10/19/2014 at 4:10 pm to Dr. Irine Seal, who verbally acknowledged these results.   Electronically Signed   By: Vinnie Langton M.D.   On: 10/19/2014 16:10   Dg Chest 2 View  10/13/2014   CLINICAL DATA:  Cough and congestion for 1 month.  Ex-smoker.  EXAM: CHEST  2  VIEW  COMPARISON:  03/02/2012 and CT of 04/15/2012  FINDINGS: Median sternotomy for aortic valve repair. Midline  trachea. Normal heart size. Prominent right-sided epicardial fat pad, similar. Mediastinal contours otherwise within normal limits. No pleural effusion or pneumothorax. Mildly low lung volumes. Clear lungs.  IMPRESSION: No acute cardiopulmonary disease.   Electronically Signed   By: Abigail Miyamoto M.D.   On: 10/13/2014 08:22   US Renal  10/19/2014   CLINICAL DATA:  Hematuria and left flank pain for 4 days.  EXAM: RENAL/URINARY TRACT ULTRASOUND COMPLETE  COMPARISON:  CT abdomen and pelvis 10/16/2014  FINDINGS: Right Kidney:  Length: 10.1 cm. Echogenicity within normal limits. No mass or hydronephrosis visualized.  Left Kidney:  Length: 11.3 cm. Echogenicity within normal limits. No mass visualized. At most minimal fullness of the intrarenal collecting system without hydronephrosis.  Bladder:  Foley catheter in place with decompressed bladder.  Incidental note is made of increased liver echogenicity, incompletely evaluated but suggestive of mild hepatic steatosis.  IMPRESSION: No hydronephrosis.   Electronically Signed   By: Logan Bores   On: 10/19/2014 11:33   Dg Chest Port 1 View  10/21/2014   CLINICAL DATA:  Sudden onset of shortness of breath, heaviness in chest.  EXAM: PORTABLE CHEST - 1 VIEW  COMPARISON:  10/19/2014  FINDINGS: Lung volumes remain low. Patient is post median sternotomy. Development of pulmonary edema and left pleural effusion. Cardiomediastinal contours are unchanged. There is linear atelectasis in the left midlung zone. No pneumothorax. No acute osseous abnormality.  IMPRESSION: Development of pulmonary edema and left pleural effusion, consistent with CHF.   Electronically Signed   By: Jeb Levering M.D.   On: 10/21/2014 04:38   Dg Chest Port 1 View  10/19/2014   CLINICAL DATA:  Left-sided pain  EXAM: PORTABLE CHEST - 1 VIEW  COMPARISON:  10/13/2014  FINDINGS: The heart size  and mediastinal contours are within normal limits. Both lungs are clear. The visualized skeletal structures are unremarkable. Postsurgical changes are noted.  IMPRESSION: No active disease.   Electronically Signed   By: Inez Catalina M.D.   On: 10/19/2014 13:32   Dg Chest Port 1v Same Day  10/21/2014   CLINICAL DATA:  Reassess CHF  EXAM: PORTABLE CHEST - 1 VIEW SAME DAY  COMPARISON:  Film from earlier in the same day  FINDINGS: Cardiac shadow is stable. Postoperative changes are again seen. A right-sided PICC line is now noted with the catheter tip in the mid superior vena cava in satisfactory position. Mild left basilar atelectasis is noted. The degree of vascular congestion has improved significantly in the interval from the prior exam. Mild residual changes are noted.  IMPRESSION: Significant improvement in CHF. Minimal residual atelectasis is noted.   Electronically Signed   By: Inez Catalina M.D.   On: 10/21/2014 10:11   Ct Renal Stone Study  10/17/2014   CLINICAL DATA:  LEFT flank pain beginning at 7 pm , hematuria. On Coumadin. Delayed time viral infection for 6 weeks with several rounds of antibiotics. History of diabetes.  EXAM: CT ABDOMEN AND PELVIS WITHOUT CONTRAST  TECHNIQUE: Multidetector CT imaging of the abdomen and pelvis was performed following the standard protocol without IV contrast.  COMPARISON:  MRI of the abdomen report July 08, 2000  FINDINGS: LUNG BASES: Included view of the lung bases are clear. The included heart appears mildly enlarged, no pericardial fluid collection. Partially imaged apparent aortic bowel by replacement, and median sternotomy.  KIDNEYS/BLADDER: Kidneys are orthotopic, demonstrating normal size and morphology. No nephrolithiasis, hydronephrosis; limited assessment for renal masses on this nonenhanced examination. The unopacified ureters  are normal in course and caliber. Urinary bladder is partially distended and unremarkable.  SOLID ORGANS: The spleen,  gallbladder, pancreas and adrenal glands are unremarkable for this non-contrast examination. Minimal focal fatty infiltration about the falciform ligament and, focal lobulated hypodensity without mass effect within the segment 4 of the liver.  GASTROINTESTINAL TRACT: The stomach, small and large bowel are normal in course and caliber without inflammatory changes, the sensitivity may be decreased by lack of enteric contrast. Mild sigmoid diverticulosis. Normal appendix.  PERITONEUM/RETROPERITONEUM: Aortoiliac vessels are normal in course and caliber, mild calcific atherosclerosis. No lymphadenopathy by CT size criteria. Status post hysterectomy. No intraperitoneal free fluid nor free air.  SOFT TISSUES/ OSSEOUS STRUCTURES: Nonsuspicious. Mild to moderate RIGHT hip osteoarthrosis.  IMPRESSION: No urolithiasis, obstructive uropathy nor acute intra-abdominal/pelvic process.  Indeterminate lesion in segment 4 of the liver, differential diagnosis includes hemangioma, focal fat. Prior MRI 2001 reported multiple cavernous hemangiomas in the liver, though images are not available for direct comparison.   Electronically Signed   By: Elon Alas   On: 10/17/2014 00:38    EKG:  SR PVC nonspecific ST changes   ASSESSMENT AND PLAN:  AVR:  59 years old  No history of CVA/TIA or afib.  Known chronically high gradients due to 96mm valve and patient - prosthetic mismatch.  TEE 2014 with high gradient no thrombosis or pannus INR;s Rx.  Admitted with hematuria.  Ok to stop heparin 2 hrs before any procedure as in restenting and ok to keep her off for 3-4 days and then recoumadinize.  Continue antibiotics  And pain  Control  No history of CAD clean cath prior to AVR in 2012  Signed: Jenkins Rouge 10/23/2014, 12:33 PM

## 2014-10-23 NOTE — Progress Notes (Addendum)
Patient's renal ultrasound showed no evidence of hydro, stent visible and appeared to be in good position.  Renal pelvis inhomogenous which I presume could represent blood/on-going bleeding from her left kidney.    Spoke with Dr. Johnsie Cancel from Cardiology who thought it to be reasonable to stop heparin gtt for a few days and allow the bleeding to stop prior to resuming her coumadin.  I think exchanging her stent would likely just occlude again as long as she is anticoagulated.    Together with patient and family we opted to hold heparin gtt and continue to monitor for ongoing bleeding, worsening pain or declining renal function.     I have ordered heparin gtt to be held.  I have also ordered patient to be NPO pMN in the event she needs any procedures tomorrow.  Continue with IV Tylenol and abx.  I will let Dr. Tresa Moore, urology, our plan moving forward.

## 2014-10-23 NOTE — Progress Notes (Signed)
[facility] PROGRESS NOTE  [name] HEN:[id number] DOB: 28-Mar-1956 DOA: 10/19/2014 PCP: Ileana Roup, MD  Assessment/Plan: #1 left flank pain/gross hematuria/hydronephrosis CT of the abdomen and pelvis with left renal pelvic clot versus possible urothelial mass. Patient's gross hematuria was improving, however overnight patient states noted more clots in her urine. Patient with increased flank pain/renal colic. Patient has been seen by urology and patient s/p cystoscopy, left retrograde, left ureteroscopy, with biopsy. Patient currently on heparin drip. Coumadin on hold. Hemoglobin trending down. Due to risk patient's recurrent colic concern for possible clot reformed ration and possible left kidney obstruction. Renal ultrasound has been ordered and is pending. Urology recommending holding IV heparin. Patient reluctant at this time secondary to mechanical AV valve. Will consulted cardiology, and per patient request for further evaluation and management for her valve and anticoagulation. Per urology.   #2 urinary tract infection Patient was on Keflex as outpatient. Urine cultures at that time were negative. Continue IV Rocephin.  #3 hyponatremia/hypokalemia  Improved with hydration. Hypokalemia likely due to diuresis yesterday. Replete. Follow.  #4 chest pain Patient denies any further chest pain. Patient states this is her chronic chest pain. Cardiac enzymes negative 3. 2-D echo with EF of 60-65% with no wall motion abnormalities. Aortic valve with moderate stenosis. Patient on IV heparin for aortic valve. Concern for possible clot reformation and an obstruction and as such cardiology has been consulted.  #5 Volume overload vs acute diastolic CHF exac Patient with clinical improvement after diureses. CXR c/w volume overload yesterday. Patient given lasix 40mg  IV x 2 with significant clinical improvement. NSL IVF.  #6 acute blood loss anemia/anemia Likely secondary to gross  hematuria. Hgb at 8.9 from 9.8 from 9.6 from 11.3. Follow H&H.  #7 status post aortic valve replacement Currently on IV heparin. Patient with hemoglobin drifting down and currently at 8.9. Patient with complaints of significant left flank pain again which started last night concern for clot read formation and obstruction. Patient has been seen by urology and recommending holding heparin for the next 24-48 hours. Due to patient's valve will consult with cardiology for further management and per patient request. Cardiology consultation pending.  #8 gastroesophageal reflux disease PPI.  #9 prophylaxis PPI for GI prophylaxis. Heparin for DVT prophylaxis.  Code Status: Full Family Communication: Updated patient and husband at bedside. Disposition Plan: Remain inpatient.   Consultants:  Urology: Dr. Alinda Money 10/19/2014  Procedures:  CT abdomen and pelvis 10/19/2014  Renal ultrasound 10/19/2014  Chest x-ray 10/19/2014, 10/21/14  2-D echo 10/20/2014 Cystoscopy with left retrograde pyelogram with interpretation, left ureteroscopy with ureteroscopic biopsy, insertion of left ureteral stent 6 x 24, Contour, no tether, bladder biopsy with Fulguration. 10/22/14 Dr Tresa Moore  Antibiotics:  IV Rocephin 10/19/2014    HPI/Subjective: Patient still with blood in urine. Patient denies any nausea, or emesis. Patient states SOB improved. Patient c/o left flank pain as was on presentation.  Objective: Filed Vitals:   10/23/14 0504  BP: 125/64  Pulse: 68  Temp: 98.1 F (36.7 C)  Resp: 18    Intake/Output Summary (Last 24 hours) at 10/23/14 1110 Last data filed at 10/23/14 0745  Gross per 24 hour  Intake    511 ml  Output   1300 ml  Net   -789 ml   Filed Weights   10/21/14 0448 10/22/14 0518 10/23/14 0504  Weight: 83.235 kg (183 lb 8 oz) 80.06 kg (176 lb 8 oz) 81.194 kg (179 lb)    Exam:  General:  NAD  Cardiovascular: RRR with 3/6 SEM  Respiratory: CTAB  Abdomen: Soft,  nondistended, positive bowel sounds. Left CVA tenderness to palpation.  Musculoskeletal: No clubbing cyanosis or edema.  Data Reviewed: Basic Metabolic Panel:  Recent Labs Lab 10/19/14 0844 10/19/14 1308 10/20/14 0214 10/21/14 0143 10/22/14 0530 10/23/14 0600  NA 129*  --  136 137 140 139  K 3.9  --  4.0 4.0 3.6 3.1*  CL 99  --  104 105 102 101  CO2 22  --  23 24 32 28  GLUCOSE 218*  --  132* 136* 189* 171*  BUN 14  --  10 9 14 20   CREATININE 0.94  --  1.14* 1.05 0.97 1.10  CALCIUM 8.2*  --  8.4 8.2* 8.5 8.2*  MG  --  1.8  --   --   --   --    Liver Function Tests:  Recent Labs Lab 10/19/14 0844  AST 26  ALT 15  ALKPHOS 75  BILITOT 0.5  PROT 7.0  ALBUMIN 3.9   No results for input(s): LIPASE, AMYLASE in the last 168 hours. No results for input(s): AMMONIA in the last 168 hours. CBC:  Recent Labs Lab 10/16/14 2158 10/19/14 0844 10/20/14 0214 10/21/14 0143 10/22/14 0530 10/23/14 0145 10/23/14 0600  WBC 11.8* 11.6* 15.7* 13.7* 10.9*  --  14.4*  NEUTROABS 5.6 7.6  --   --   --   --   --   HGB 13.5 11.0* 11.3* 9.6* 9.8* 8.7* 8.9*  HCT 40.8 33.0* 33.8* 28.7* 30.1* 26.6* 27.1*  MCV 87.9 89.7 88.9 89.1 90.1  --  90.3  PLT 301 281 266 239 301  --  286   Cardiac Enzymes:  Recent Labs Lab 10/19/14 1308 10/19/14 1835 10/20/14 0216  TROPONINI <0.03 <0.03 <0.03   BNP (last 3 results)  Recent Labs  10/21/14 1011 10/22/14 0530  BNP 391.7* 169.2*    ProBNP (last 3 results) No results for input(s): PROBNP in the last 8760 hours.  CBG:  Recent Labs Lab 10/22/14 1131 10/22/14 1749 10/22/14 2055 10/23/14 0726 10/23/14 1010  GLUCAP 163* 178* 258* 153* 150*    Recent Results (from the past 240 hour(s))  Urine culture     Status: None   Collection Time: 10/16/14 10:00 PM  Result Value Ref Range Status   Specimen Description URINE, RANDOM  Final   Special Requests NONE  Final   Colony Count   Final    2,000 COLONIES/ML Performed at [facility]    Culture   Final    INSIGNIFICANT GROWTH Performed at [facility]    Report Status 10/18/2014 FINAL  Final  Urine culture     Status: None   Collection Time: 10/19/14 10:20 AM  Result Value Ref Range Status   Specimen Description URINE, CATHETERIZED  Final   Special Requests NONE  Final   Colony Count NO GROWTH Performed at [facility]   Final   Culture NO GROWTH Performed at [facility]   Final   Report Status 10/21/2014 FINAL  Final  Culture, blood (routine x 2)     Status: None (Preliminary result)   Collection Time: 10/20/14  9:37 AM  Result Value Ref Range Status   Specimen Description BLOOD RIGHT HAND  Final   Special Requests BOTTLES DRAWN AEROBIC ONLY 3CC  Final   Culture   Final           BLOOD CULTURE RECEIVED  NO GROWTH TO DATE CULTURE WILL BE HELD FOR 5 DAYS BEFORE ISSUING A FINAL NEGATIVE REPORT Note: Culture results may be compromised due to an inadequate volume of blood received in culture bottles. Performed at [facility]    Report Status PENDING  Incomplete  Surgical PCR screen     Status: None   Collection Time: 10/21/14 12:45 PM  Result Value Ref Range Status   MRSA, PCR NEGATIVE NEGATIVE Final   Staphylococcus aureus NEGATIVE NEGATIVE Final    Comment:        The Xpert SA Assay (FDA approved for NASAL specimens in patients over 101 years of age), is one component of a comprehensive surveillance program.  Test performance has been validated by [facility] for patients greater than or equal to 63 year old. It is not intended to diagnose infection nor to guide or monitor treatment. Performed at [facility]      Studies: No results found.  Scheduled Meds: . acetaminophen  1,000 mg Intravenous 4 times per day  . antiseptic oral rinse  7 mL Mouth Rinse BID  . aspirin  81 mg Oral Daily  . cefTRIAXone (ROCEPHIN)  IV  1 g Intravenous Q24H  . docusate sodium  100 mg Oral BID  . fesoterodine  4  mg Oral Daily  . insulin aspart  0-20 Units Subcutaneous TID WC  . loratadine  10 mg Oral Daily  . metoprolol tartrate  25 mg Oral BID  . potassium chloride  40 mEq Oral Q4H  . rosuvastatin  20 mg Oral Daily  . sodium chloride  10-40 mL Intracatheter Q12H  . sodium chloride  3 mL Intravenous Q12H   Continuous Infusions: . heparin 1,600 Units/hr (10/23/14 0745)    Principal Problem:   Acute left flank pain Active Problems:   Anxiety state   Aortic valve disorder   GERD   S/P AVR (aortic valve replacement)   Chest pain   UTI (lower urinary tract infection)   Hyponatremia   Anemia   Acute blood loss anemia   Acute hyponatremia   Hypervolemia    Time spent: [facility] Pager [phone]. If 7PM-7AM, please contact night-coverage at [email or web address], password [facility] 10/23/2014, 11:10 AM  LOS: 4 days

## 2014-10-24 ENCOUNTER — Encounter (HOSPITAL_COMMUNITY): Payer: Self-pay | Admitting: Urology

## 2014-10-24 LAB — CBC
HCT: 27.3 % — ABNORMAL LOW (ref 36.0–46.0)
HEMOGLOBIN: 8.9 g/dL — AB (ref 12.0–15.0)
MCH: 29.7 pg (ref 26.0–34.0)
MCHC: 32.6 g/dL (ref 30.0–36.0)
MCV: 91 fL (ref 78.0–100.0)
Platelets: 287 10*3/uL (ref 150–400)
RBC: 3 MIL/uL — ABNORMAL LOW (ref 3.87–5.11)
RDW: 14.5 % (ref 11.5–15.5)
WBC: 9 10*3/uL (ref 4.0–10.5)

## 2014-10-24 LAB — BASIC METABOLIC PANEL
ANION GAP: 6 (ref 5–15)
BUN: 14 mg/dL (ref 6–23)
CO2: 27 mmol/L (ref 19–32)
Calcium: 8.5 mg/dL (ref 8.4–10.5)
Chloride: 107 mmol/L (ref 96–112)
Creatinine, Ser: 1.12 mg/dL — ABNORMAL HIGH (ref 0.50–1.10)
GFR calc Af Amer: 62 mL/min — ABNORMAL LOW (ref >90)
GFR, EST NON AFRICAN AMERICAN: 53 mL/min — AB (ref >90)
GLUCOSE: 135 mg/dL — AB (ref 70–99)
POTASSIUM: 3.9 mmol/L (ref 3.5–5.1)
Sodium: 140 mmol/L (ref 135–145)

## 2014-10-24 LAB — GLUCOSE, CAPILLARY
Glucose-Capillary: 134 mg/dL — ABNORMAL HIGH (ref 70–99)
Glucose-Capillary: 230 mg/dL — ABNORMAL HIGH (ref 70–99)
Glucose-Capillary: 129 mg/dL — ABNORMAL HIGH (ref 70–99)
Glucose-Capillary: 144 mg/dL — ABNORMAL HIGH (ref 70–99)

## 2014-10-24 NOTE — Progress Notes (Signed)
3 Days Post-Op  Subjective:  1 - Gross Hematuria, Left Flank Pain and Hydronephrosis - pt with left renal pelvis clot by CT this admission on eval gross hematuria and left flank pain. Ureteroscopy / biopsy / stent 10/21/14 and path pending.Marland Kitchen  PMH sig for aortic valve replacement (h/o bicuspid with progressive stenosis prior), hyst.   Today [name] is seen in f/u above. No events overnight. She has been off heparin gtt as she was having some hematuria with clots. Now just hematuria only. No severe flank pain or fevers. [place] yesterday w/o sig hydro and stent in good position. Path pending.   Objective: Vital signs in last 24 hours: Temp:  [97.7 F (36.5 C)-97.9 F (36.6 C)] 97.7 F (36.5 C) (03/14 0453) Pulse Rate:  [74] 74 (03/14 0453) Resp:  [18] 18 (03/14 0453) BP: (110-129)/(57-61) 118/58 mmHg (03/14 0453) SpO2:  [95 %-99 %] 95 % (03/14 0453) Weight:  [80.4 kg (177 lb 4 oz)] 80.4 kg (177 lb 4 oz) (03/14 0453) Last BM Date: 10/19/14  Intake/Output from previous day: 03/13 0701 - 03/14 0700 In: 276 [I.V.:126; IV Piggyback:150] Out: 1200 [Urine:1200] Intake/Output this shift:    General appearance: alert, cooperative and appears stated age Nose: Nares normal. Septum midline. Mucosa normal. No drainage or sinus tenderness. Throat: lips, mucosa, and tongue normal; teeth and gums normal Neck: supple, symmetrical, trachea midline Back: symmetric, no curvature. ROM normal. No CVA tenderness. Resp: non-labored on [place] O2 GI: soft, non-tender; bowel sounds normal; no masses,  no organomegaly Extremities: extremities normal, atraumatic, no cyanosis or edema Pulses: 2+ and symmetric Skin: Skin color, texture, turgor normal. No rashes or lesions Lymph nodes: Cervical, supraclavicular, and axillary nodes normal. Neurologic: Grossly normal  No CVAT  Lab Results:   Recent Labs  10/23/14 0600 10/23/14 1600 10/24/14 0445  WBC 14.4*  --  9.0  HGB 8.9* 9.9* 8.9*  HCT 27.1* 31.1* 27.3*   PLT 286  --  287   BMET  Recent Labs  10/23/14 0600 10/24/14 0445  NA 139 140  K 3.1* 3.9  CL 101 107  CO2 28 27  GLUCOSE 171* 135*  BUN 20 14  CREATININE 1.10 1.12*  CALCIUM 8.2* 8.5   PT/INR No results for input(s): LABPROT, INR in the last 72 hours. ABG No results for input(s): PHART, HCO3 in the last 72 hours.  Invalid input(s): PCO2, PO2  Studies/Results: US Renal  10/23/2014   CLINICAL DATA:  59 year old female with history of left-sided flank pain. Recent history of left-sided hydronephrosis noted on CT scan 10/19/2014, status post stent placement on 10/21/2014. Hematuria.  EXAM: RENAL/URINARY TRACT ULTRASOUND COMPLETE  COMPARISON:  CT of the abdomen and pelvis 10/19/2014. Abdominal ultrasound 10/19/2014.  FINDINGS: Right Kidney:  Length: 11.1. Echogenicity within normal limits. No mass or hydronephrosis visualized.  Left Kidney:  Length: 12.0. Echogenicity within normal limits. No mass visualized. Mild fullness in the left renal collecting system. Echogenic structure in the left renal pelvis, presumably the left renal stent, difficult to visualize.  Bladder:  Small amount of echogenic material in the left side of the urinary bladder near the left ureterovesicular junction, suspicious for clot associated with the distal end of left ureteral stent.  IMPRESSION: 1. Interval placement of left-sided ureteral stent which is poorly visualized by ultrasound examination. There is some echogenic material adjacent to the distal end of the stent within the urinary bladder, which may represent some adherent blood clot. Mild fullness of the left renal collecting system, without frank  hydronephrosis.   Electronically Signed   By: Vinnie Langton M.D.   On: 10/23/2014 14:37    Anti-infectives: Anti-infectives    Start     Dose/Rate Route Frequency Ordered Stop   10/21/14 1049  gentamicin (GARAMYCIN) 320 mg in dextrose 5 % 100 mL IVPB     5 mg/kg  63.3 kg (Adjusted) 108 mL/hr over 60  Minutes Intravenous 30 min pre-op 10/21/14 1049 10/21/14 1400   10/21/14 1041  gentamicin (GARAMYCIN) IVPB 80 mg  Status:  Discontinued     80 mg 100 mL/hr over 30 Minutes Intravenous 30 min pre-op 10/21/14 1041 10/21/14 1047   10/19/14 1315  cefTRIAXone (ROCEPHIN) 1 g in dextrose 5 % 50 mL IVPB     1 g 100 mL/hr over 30 Minutes Intravenous Every 24 hours 10/19/14 1301        Assessment/Plan:  1 - Gross Hematuria, Left Flank Pain and Hydronephrosis - now s/p ureteroscopic biopsy, further management pending results. If benign overall plan will be to bridge back onto anticoagulation and remove stent as outpatient v. Other if malignant.  We discussed competing risks on / off blood thinners at this point and the fact that some level of visible blood in urine is not worrisome and expected in setting of recent instrumentaiton and stent in situ and Hgb stability encouraging.   Favor not restart coumadin until BX results final in case pt requires more surgery this admission, likely will be resulted tomorrow.   [facility], Nazair Fortenberry 10/24/2014

## 2014-10-24 NOTE — Progress Notes (Signed)
[facility] PROGRESS NOTE [name] YQM:[id number] DOB: [birth year]  DOA: 10/19/2014 PCP: Ileana Roup, MD  Assessment/Plan: #1 left flank pain/gross hematuria/hydronephrosis CT of the abdomen and pelvis with left renal pelvic clot versus possible urothelial mass. Patient's gross hematuria was improving, however overnight patient states noted more clots in her urine. Patient with increased flank pain/renal colic. Patient has been seen by urology and patient s/p cystoscopy, left retrograde, left ureteroscopy, with biopsy. Patient currently off heparin drip. Coumadin on hold. Hemoglobin stable. Due to risk patient's recurrent colic concern for possible clot reformed ration and possible left kidney obstruction. Renal ultrasound negative. IV heparin on hold. Cardiology ff. Per urology.   #2 urinary tract infection Patient was on Keflex as outpatient. Urine cultures at that time were negative. Continue IV Rocephin.  #3 hyponatremia/hypokalemia  Improved with hydration. Hypokalemia likely due to diuresis yesterday. Repleted. Follow.  #4 chest pain Patient denies any further chest pain. Patient states this is her chronic chest pain. Cardiac enzymes negative 3. 2-D echo with EF of 60-65% with no wall motion abnormalities. Aortic valve with moderate stenosis. Patient on IV heparin for aortic valve. Cardiology ff.  #5 Volume overload vs acute diastolic CHF exac Patient with clinical improvement after diureses. CXR c/w volume overload. Patient given lasix 40mg  IV x 2 with significant clinical improvement. NSL IVF.  #6 acute blood loss anemia/anemia Likely secondary to gross hematuria. Hgb at 8.9 from 9.8 from 9.6 from 11.3. Follow H&H.  #7 status post aortic valve replacement Currently on IV heparin. Patient with hemoglobin drifting down and currently at 8.9. Patient states left flank pain improved. Patient has been seen by urology and recommending holding heparin for the next 24-48  hours. Patient has been seen by cardiology and recommend holding heparin gtt for 3-4 days. Heparin on hold. Cardiology ff.  #8 gastroesophageal reflux disease PPI.  #9 prophylaxis PPI for GI prophylaxis. Heparin for DVT prophylaxis.  Code Status: Full Family Communication: Updated patient and husband at bedside. Disposition Plan: Remain inpatient.   Consultants:  Urology: Dr. Alinda Money 10/19/2014  Cardiology : Dr Johnsie Cancel 10/23/14  Procedures:  CT abdomen and pelvis 10/19/2014  Renal ultrasound 10/19/2014  Chest x-ray 10/19/2014, 10/21/14  2-D echo 10/20/2014 Cystoscopy with left retrograde pyelogram with interpretation, left ureteroscopy with ureteroscopic biopsy, insertion of left ureteral stent 6 x 24, Contour, no tether, bladder biopsy with Fulguration. 10/22/14 Dr Tresa Moore Renal US 10/23/14.  Antibiotics:  IV Rocephin 10/19/2014    HPI/Subjective: Patient still with blood in urine. Patient denies any nausea, or emesis. Patient states SOB improved. Patient states left flank pain improved.   Objective: Filed Vitals:   10/24/14 1419  BP: 103/58  Pulse: 76  Temp: 97.8 F (36.6 C)  Resp: 16    Intake/Output Summary (Last 24 hours) at 10/24/14 1449 Last data filed at 10/24/14 1431  Gross per 24 hour  Intake    650 ml  Output    850 ml  Net   -200 ml   Filed Weights   10/22/14 0518 10/23/14 0504 10/24/14 0453  Weight: 80.06 kg (176 lb 8 oz) 81.194 kg (179 lb) 80.4 kg (177 lb 4 oz)    Exam:   General:  NAD  Cardiovascular: RRR with 3/6 SEM  Respiratory: CTAB  Abdomen: Soft, nondistended, positive bowel sounds. Left CVA non tenderness to palpation.  Musculoskeletal: No clubbing cyanosis or edema.  Data Reviewed: Basic Metabolic Panel:  Recent Labs Lab 10/19/14 1308 10/20/14 0214 10/21/14 0143 10/22/14 0530 10/23/14  0600 10/24/14 0445  NA  --  136 137 140 139 140  K  --  4.0 4.0 3.6 3.1* 3.9  CL  --  104 105 102 101 107  CO2  --  23 24 32 28  27  GLUCOSE  --  132* 136* 189* 171* 135*  BUN  --  10 9 14 20 14   CREATININE  --  1.14* 1.05 0.97 1.10 1.12*  CALCIUM  --  8.4 8.2* 8.5 8.2* 8.5  MG 1.8  --   --   --   --   --    Liver Function Tests:  Recent Labs Lab 10/19/14 0844  AST 26  ALT 15  ALKPHOS 75  BILITOT 0.5  PROT 7.0  ALBUMIN 3.9   No results for input(s): LIPASE, AMYLASE in the last 168 hours. No results for input(s): AMMONIA in the last 168 hours. CBC:  Recent Labs Lab 10/19/14 0844 10/20/14 0214 10/21/14 0143 10/22/14 0530 10/23/14 0145 10/23/14 0600 10/23/14 1600 10/24/14 0445  WBC 11.6* 15.7* 13.7* 10.9*  --  14.4*  --  9.0  NEUTROABS 7.6  --   --   --   --   --   --   --   HGB 11.0* 11.3* 9.6* 9.8* 8.7* 8.9* 9.9* 8.9*  HCT 33.0* 33.8* 28.7* 30.1* 26.6* 27.1* 31.1* 27.3*  MCV 89.7 88.9 89.1 90.1  --  90.3  --  91.0  PLT 281 266 239 301  --  286  --  287   Cardiac Enzymes:  Recent Labs Lab 10/19/14 1308 10/19/14 1835 10/20/14 0216  TROPONINI <0.03 <0.03 <0.03   BNP (last 3 results)  Recent Labs  10/21/14 1011 10/22/14 0530  BNP 391.7* 169.2*    ProBNP (last 3 results) No results for input(s): PROBNP in the last 8760 hours.  CBG:  Recent Labs Lab 10/23/14 1158 10/23/14 1802 10/23/14 2127 10/24/14 0815 10/24/14 1139  GLUCAP 106* 180* 133* 134* 230*    Recent Results (from the past 240 hour(s))  Urine culture     Status: None   Collection Time: 10/16/14 10:00 PM  Result Value Ref Range Status   Specimen Description URINE, RANDOM  Final   Special Requests NONE  Final   Colony Count   Final    2,000 COLONIES/ML Performed at [facility]    Culture   Final    INSIGNIFICANT GROWTH Performed at [facility]    Report Status 10/18/2014 FINAL  Final  Urine culture     Status: None   Collection Time: 10/19/14 10:20 AM  Result Value Ref Range Status   Specimen Description URINE, CATHETERIZED  Final   Special Requests NONE  Final   Colony Count NO  GROWTH Performed at [facility]   Final   Culture NO GROWTH Performed at [facility]   Final   Report Status 10/21/2014 FINAL  Final  Culture, blood (routine x 2)     Status: None (Preliminary result)   Collection Time: 10/20/14  9:37 AM  Result Value Ref Range Status   Specimen Description BLOOD RIGHT HAND  Final   Special Requests BOTTLES DRAWN AEROBIC ONLY 3CC  Final   Culture   Final           BLOOD CULTURE RECEIVED NO GROWTH TO DATE CULTURE WILL BE HELD FOR 5 DAYS BEFORE ISSUING A FINAL NEGATIVE REPORT Note: Culture results may be compromised due to an inadequate volume of blood received in  culture bottles. Performed at [facility]    Report Status PENDING  Incomplete  Surgical PCR screen     Status: None   Collection Time: 10/21/14 12:45 PM  Result Value Ref Range Status   MRSA, PCR NEGATIVE NEGATIVE Final   Staphylococcus aureus NEGATIVE NEGATIVE Final    Comment:        The Xpert SA Assay (FDA approved for NASAL specimens in patients over 79 years of age), is one component of a comprehensive surveillance program.  Test performance has been validated by [facility] for patients greater than or equal to 1 year old. It is not intended to diagnose infection nor to guide or monitor treatment. Performed at [facility]      Studies: US Renal  10/23/2014   CLINICAL DATA:  59 year old female with history of left-sided flank pain. Recent history of left-sided hydronephrosis noted on CT scan 10/19/2014, status post stent placement on 10/21/2014. Hematuria.  EXAM: RENAL/URINARY TRACT ULTRASOUND COMPLETE  COMPARISON:  CT of the abdomen and pelvis 10/19/2014. Abdominal ultrasound 10/19/2014.  FINDINGS: Right Kidney:  Length: 11.1. Echogenicity within normal limits. No mass or hydronephrosis visualized.  Left Kidney:  Length: 12.0. Echogenicity within normal limits. No mass visualized. Mild fullness in the left renal collecting system. Echogenic  structure in the left renal pelvis, presumably the left renal stent, difficult to visualize.  Bladder:  Small amount of echogenic material in the left side of the urinary bladder near the left ureterovesicular junction, suspicious for clot associated with the distal end of left ureteral stent.  IMPRESSION: 1. Interval placement of left-sided ureteral stent which is poorly visualized by ultrasound examination. There is some echogenic material adjacent to the distal end of the stent within the urinary bladder, which may represent some adherent blood clot. Mild fullness of the left renal collecting system, without frank hydronephrosis.   Electronically Signed   By: Vinnie Langton M.D.   On: 10/23/2014 14:37    Scheduled Meds: . antiseptic oral rinse  7 mL Mouth Rinse BID  . aspirin  81 mg Oral Daily  . cefTRIAXone (ROCEPHIN)  IV  1 g Intravenous Q24H  . docusate sodium  100 mg Oral BID  . insulin aspart  0-20 Units Subcutaneous TID WC  . loratadine  10 mg Oral Daily  . metoprolol tartrate  25 mg Oral BID  . rosuvastatin  20 mg Oral Daily  . sodium chloride  10-40 mL Intracatheter Q12H  . sodium chloride  3 mL Intravenous Q12H   Continuous Infusions:    Principal Problem:   Acute left flank pain Active Problems:   Anxiety state   Aortic valve disorder   GERD   S/P AVR (aortic valve replacement)   Chest pain   UTI (lower urinary tract infection)   Hyponatremia   Anemia   Acute blood loss anemia   Acute hyponatremia   Hypervolemia   Hydronephrosis of left kidney    Time spent: [facility] Pager [phone]. If 7PM-7AM, please contact night-coverage at [email or web address], password [facility] 10/24/2014, 2:49 PM  LOS: 5 days

## 2014-10-25 LAB — CBC
HCT: 29.2 % — ABNORMAL LOW (ref 36.0–46.0)
Hemoglobin: 9.4 g/dL — ABNORMAL LOW (ref 12.0–15.0)
MCH: 29.5 pg (ref 26.0–34.0)
MCHC: 32.2 g/dL (ref 30.0–36.0)
MCV: 91.5 fL (ref 78.0–100.0)
Platelets: 305 10*3/uL (ref 150–400)
RBC: 3.19 MIL/uL — ABNORMAL LOW (ref 3.87–5.11)
RDW: 14.2 % (ref 11.5–15.5)
WBC: 8.5 10*3/uL (ref 4.0–10.5)

## 2014-10-25 LAB — BASIC METABOLIC PANEL
Anion gap: 9 (ref 5–15)
BUN: 17 mg/dL (ref 6–23)
CHLORIDE: 106 mmol/L (ref 96–112)
CO2: 25 mmol/L (ref 19–32)
Calcium: 8.7 mg/dL (ref 8.4–10.5)
Creatinine, Ser: 1.12 mg/dL — ABNORMAL HIGH (ref 0.50–1.10)
GFR calc non Af Amer: 53 mL/min — ABNORMAL LOW (ref >90)
GFR, EST AFRICAN AMERICAN: 62 mL/min — AB (ref >90)
GLUCOSE: 142 mg/dL — AB (ref 70–99)
POTASSIUM: 4 mmol/L (ref 3.5–5.1)
SODIUM: 140 mmol/L (ref 135–145)

## 2014-10-25 LAB — GLUCOSE, CAPILLARY
Glucose-Capillary: 109 mg/dL — ABNORMAL HIGH (ref 70–99)
Glucose-Capillary: 113 mg/dL — ABNORMAL HIGH (ref 70–99)
Glucose-Capillary: 134 mg/dL — ABNORMAL HIGH (ref 70–99)
Glucose-Capillary: 172 mg/dL — ABNORMAL HIGH (ref 70–99)

## 2014-10-25 NOTE — Progress Notes (Signed)
4 Days Post-Op  Subjective:  1 - Gross Hematuria, Left Flank Pain and Hydronephrosis - pt with left renal pelvis clot by CT this admission on eval gross hematuria and left flank pain. Ureteroscopy / biopsy / stent 10/21/14 and path all benign.  PMH sig for aortic valve replacement (h/o bicuspid with progressive stenosis prior), hyst.   Today [name] is stable. Urine clearing nicely off heparin gtt, now just dark yellow, no clots for nearly 48 hrs.   Objective: Vital signs in last 24 hours: Temp:  [98 F (36.7 C)-98.2 F (36.8 C)] 98 F (36.7 C) (03/15 1411) Pulse Rate:  [80-81] 81 (03/15 1411) Resp:  [16-21] 20 (03/15 1411) BP: (105-131)/(52-62) 105/52 mmHg (03/15 1411) SpO2:  [95 %-98 %] 98 % (03/15 1411) Weight:  [79.833 kg (176 lb)] 79.833 kg (176 lb) (03/15 0642) Last BM Date: 10/24/14  Intake/Output from previous day: 03/14 0701 - 03/15 0700 In: 1080 [P.O.:1020; IV Piggyback:60] Out: 2050 [Urine:2050] Intake/Output this shift: Total I/O In: 480 [P.O.:480] Out: 150 [Urine:150]  General appearance: alert, cooperative and appears stated age Eyes: negative Nose: Nares normal. Septum midline. Mucosa normal. No drainage or sinus tenderness. Throat: lips, mucosa, and tongue normal; teeth and gums normal Neck: supple, symmetrical, trachea midline Back: symmetric, no curvature. ROM normal. No CVA tenderness. Resp: non-labored on room air. GI: soft, non-tender; bowel sounds normal; no masses,  no organomegaly Extremities: extremities normal, atraumatic, no cyanosis or edema Pulses: 2+ and symmetric Skin: Skin color, texture, turgor normal. No rashes or lesions Lymph nodes: Cervical, supraclavicular, and axillary nodes normal. Neurologic: Grossly normal  Lab Results:   Recent Labs  10/24/14 0445 10/25/14 0435  WBC 9.0 8.5  HGB 8.9* 9.4*  HCT 27.3* 29.2*  PLT 287 305   BMET  Recent Labs  10/24/14 0445 10/25/14 0435  NA 140 140  K 3.9 4.0  CL 107 106  CO2 27 25   GLUCOSE 135* 142*  BUN 14 17  CREATININE 1.12* 1.12*  CALCIUM 8.5 8.7   PT/INR No results for input(s): LABPROT, INR in the last 72 hours. ABG No results for input(s): PHART, HCO3 in the last 72 hours.  Invalid input(s): PCO2, PO2  Studies/Results: No results found.  Anti-infectives: Anti-infectives    Start     Dose/Rate Route Frequency Ordered Stop   10/21/14 1049  gentamicin (GARAMYCIN) 320 mg in dextrose 5 % 100 mL IVPB     5 mg/kg  63.3 kg (Adjusted) 108 mL/hr over 60 Minutes Intravenous 30 min pre-op 10/21/14 1049 10/21/14 1400   10/21/14 1041  gentamicin (GARAMYCIN) IVPB 80 mg  Status:  Discontinued     80 mg 100 mL/hr over 30 Minutes Intravenous 30 min pre-op 10/21/14 1041 10/21/14 1047   10/19/14 1315  cefTRIAXone (ROCEPHIN) 1 g in dextrose 5 % 50 mL IVPB  Status:  Discontinued     1 g 100 mL/hr over 30 Minutes Intravenous Every 24 hours 10/19/14 1301 10/25/14 1358      Assessment/Plan:  1 - Gross Hematuria, Left Flank Pain and Hydronephrosis - path all benign, likely reactive from possible UTI, then clot, then obstruction in viscous cycle, now relieved with stent, ABX.  I feel it is fine to restart anticoagulation from GU perspective at anytime. I stressed to pt that she will likely have some persistent gross hematuria on / off with stent in place, but not worrisome as long as no large clots and Hgb stable.    [facility], [name] 10/25/2014

## 2014-10-25 NOTE — Progress Notes (Signed)
[facility] PROGRESS NOTE [name] [id number] DOB: Feb 19, 1956  DOA: 10/19/2014 PCP: Ileana Roup, MD  Assessment/Plan: #1 left flank pain/gross hematuria/hydronephrosis CT of the abdomen and pelvis with left renal pelvic clot versus possible urothelial mass. Patient's gross hematuria was improving, however overnight patient states noted more clots in her urine. Patient with increased flank pain/renal colic. Patient has been seen by urology and patient s/p cystoscopy, left retrograde, left ureteroscopy, with biopsy. Patient currently off heparin drip. Coumadin on hold. Hemoglobin stable. Due to risk patient's recurrent colic concern for possible clot reformation and possible left kidney obstruction. Renal ultrasound negative. IV heparin on hold. Cardiology ff. Per urology.   #2 urinary tract infection Patient was on Keflex as outpatient. Urine cultures at that time were negative. D/C IV Rocephin.  #3 hyponatremia/hypokalemia  Improved with hydration. Hypokalemia likely due to diuresis. Repleted. Follow.  #4 chest pain Patient denies any further chest pain. Patient states this is her chronic chest pain. Cardiac enzymes negative 3. 2-D echo with EF of 60-65% with no wall motion abnormalities. Aortic valve with moderate stenosis. Cardiology ff.  #5 Volume overload vs acute diastolic CHF exac Patient with clinical improvement after diureses. CXR was c/w volume overload. Patient given lasix 40mg  IV x 2 with significant clinical improvement. Patient is currently euvolemic. NSL IVF.  #6 acute blood loss anemia/anemia Likely secondary to gross hematuria. Hgb at 9.4 from 8.9 from 9.8 from 9.6 from 11.3. Follow H&H.  #7 status post aortic valve replacement Currently off IV heparin. Patient with hemoglobin stabilized at 9.4. Patient states left flank pain improved. Patient has been seen by urology and recommending holding heparin for the next 24-48 hours. Patient has been seen by  cardiology and recommend holding heparin gtt for 3-4 days. Heparin on hold. Cardiology to advice when anticoagulation can be resumed and whether patient needs a heparin bridge versus resuming Coumadin and watch it slowly get therapeutic. Cardiology ff.  #8 gastroesophageal reflux disease PPI.  #9 prophylaxis PPI for GI prophylaxis. Heparin for DVT prophylaxis.  Code Status: Full Family Communication: Updated patient, no family at bedside. Disposition Plan: Remain inpatient.   Consultants:  Urology: Dr. Alinda Money 10/19/2014  Cardiology : Dr Johnsie Cancel 10/23/14  Procedures:  CT abdomen and pelvis 10/19/2014  Renal ultrasound 10/19/2014  Chest x-ray 10/19/2014, 10/21/14  2-D echo 10/20/2014 Cystoscopy with left retrograde pyelogram with interpretation, left ureteroscopy with ureteroscopic biopsy, insertion of left ureteral stent 6 x 24, Contour, no tether, bladder biopsy with Fulguration. 10/22/14 Dr Tresa Moore Renal US 10/23/14.  Antibiotics:  IV Rocephin 10/19/2014>>>>10/25/14    HPI/Subjective: Patient still with blood in urine. Patient denies any nausea, or emesis. Patient states SOB improved. Patient states left flank pain improved.   Objective: Filed Vitals:   10/25/14 0452  BP: 131/61  Pulse: 80  Temp: 98.2 F (36.8 C)  Resp: 21    Intake/Output Summary (Last 24 hours) at 10/25/14 1119 Last data filed at 10/25/14 0800  Gross per 24 hour  Intake   1140 ml  Output   1600 ml  Net   -460 ml   Filed Weights   10/23/14 0504 10/24/14 0453 10/25/14 0642  Weight: 81.194 kg (179 lb) 80.4 kg (177 lb 4 oz) 79.833 kg (176 lb)    Exam:   General:  NAD  Cardiovascular: RRR with 3/6 SEM  Respiratory: CTAB  Abdomen: Soft, nondistended, positive bowel sounds. Left CVA non tenderness to palpation.  Musculoskeletal: No clubbing cyanosis or edema.  Data Reviewed: Basic  Metabolic Panel:  Recent Labs Lab 10/19/14 1308  10/21/14 0143 10/22/14 0530 10/23/14 0600  10/24/14 0445 10/25/14 0435  NA  --   < > 137 140 139 140 140  K  --   < > 4.0 3.6 3.1* 3.9 4.0  CL  --   < > 105 102 101 107 106  CO2  --   < > 24 32 28 27 25   GLUCOSE  --   < > 136* 189* 171* 135* 142*  BUN  --   < > 9 14 20 14 17   CREATININE  --   < > 1.05 0.97 1.10 1.12* 1.12*  CALCIUM  --   < > 8.2* 8.5 8.2* 8.5 8.7  MG 1.8  --   --   --   --   --   --   < > = values in this interval not displayed. Liver Function Tests:  Recent Labs Lab 10/19/14 0844  AST 26  ALT 15  ALKPHOS 75  BILITOT 0.5  PROT 7.0  ALBUMIN 3.9   No results for input(s): LIPASE, AMYLASE in the last 168 hours. No results for input(s): AMMONIA in the last 168 hours. CBC:  Recent Labs Lab 10/19/14 0844  10/21/14 0143 10/22/14 0530 10/23/14 0145 10/23/14 0600 10/23/14 1600 10/24/14 0445 10/25/14 0435  WBC 11.6*  < > 13.7* 10.9*  --  14.4*  --  9.0 8.5  NEUTROABS 7.6  --   --   --   --   --   --   --   --   HGB 11.0*  < > 9.6* 9.8* 8.7* 8.9* 9.9* 8.9* 9.4*  HCT 33.0*  < > 28.7* 30.1* 26.6* 27.1* 31.1* 27.3* 29.2*  MCV 89.7  < > 89.1 90.1  --  90.3  --  91.0 91.5  PLT 281  < > 239 301  --  286  --  287 305  < > = values in this interval not displayed. Cardiac Enzymes:  Recent Labs Lab 10/19/14 1308 10/19/14 1835 10/20/14 0216  TROPONINI <0.03 <0.03 <0.03   BNP (last 3 results)  Recent Labs  10/21/14 1011 10/22/14 0530  BNP 391.7* 169.2*    ProBNP (last 3 results) No results for input(s): PROBNP in the last 8760 hours.  CBG:  Recent Labs Lab 10/24/14 0815 10/24/14 1139 10/24/14 1714 10/24/14 2141 10/25/14 0705  GLUCAP 134* 230* 129* 144* 109*    Recent Results (from the past 240 hour(s))  Urine culture     Status: None   Collection Time: 10/16/14 10:00 PM  Result Value Ref Range Status   Specimen Description URINE, RANDOM  Final   Special Requests NONE  Final   Colony Count   Final    2,000 COLONIES/ML Performed at [facility]    Culture   Final     INSIGNIFICANT GROWTH Performed at [facility]    Report Status 10/18/2014 FINAL  Final  Urine culture     Status: None   Collection Time: 10/19/14 10:20 AM  Result Value Ref Range Status   Specimen Description URINE, CATHETERIZED  Final   Special Requests NONE  Final   Colony Count NO GROWTH Performed at [facility]   Final   Culture NO GROWTH Performed at [facility]   Final   Report Status 10/21/2014 FINAL  Final  Culture, blood (routine x 2)     Status: None (Preliminary result)   Collection Time: 10/20/14  9:37  AM  Result Value Ref Range Status   Specimen Description BLOOD RIGHT HAND  Final   Special Requests BOTTLES DRAWN AEROBIC ONLY 3CC  Final   Culture   Final           BLOOD CULTURE RECEIVED NO GROWTH TO DATE CULTURE WILL BE HELD FOR 5 DAYS BEFORE ISSUING A FINAL NEGATIVE REPORT Note: Culture results may be compromised due to an inadequate volume of blood received in culture bottles. Performed at [facility]    Report Status PENDING  Incomplete  Surgical PCR screen     Status: None   Collection Time: 10/21/14 12:45 PM  Result Value Ref Range Status   MRSA, PCR NEGATIVE NEGATIVE Final   Staphylococcus aureus NEGATIVE NEGATIVE Final    Comment:        The Xpert SA Assay (FDA approved for NASAL specimens in patients over 51 years of age), is one component of a comprehensive surveillance program.  Test performance has been validated by [facility] for patients greater than or equal to 21 year old. It is not intended to diagnose infection nor to guide or monitor treatment. Performed at [facility]      Studies: US Renal  10/23/2014   CLINICAL DATA:  59 year old female with history of left-sided flank pain. Recent history of left-sided hydronephrosis noted on CT scan 10/19/2014, status post stent placement on 10/21/2014. Hematuria.  EXAM: RENAL/URINARY TRACT ULTRASOUND COMPLETE  COMPARISON:  CT of the abdomen and pelvis  10/19/2014. Abdominal ultrasound 10/19/2014.  FINDINGS: Right Kidney:  Length: 11.1. Echogenicity within normal limits. No mass or hydronephrosis visualized.  Left Kidney:  Length: 12.0. Echogenicity within normal limits. No mass visualized. Mild fullness in the left renal collecting system. Echogenic structure in the left renal pelvis, presumably the left renal stent, difficult to visualize.  Bladder:  Small amount of echogenic material in the left side of the urinary bladder near the left ureterovesicular junction, suspicious for clot associated with the distal end of left ureteral stent.  IMPRESSION: 1. Interval placement of left-sided ureteral stent which is poorly visualized by ultrasound examination. There is some echogenic material adjacent to the distal end of the stent within the urinary bladder, which may represent some adherent blood clot. Mild fullness of the left renal collecting system, without frank hydronephrosis.   Electronically Signed   By: Vinnie Langton M.D.   On: 10/23/2014 14:37    Scheduled Meds: . antiseptic oral rinse  7 mL Mouth Rinse BID  . aspirin  81 mg Oral Daily  . cefTRIAXone (ROCEPHIN)  IV  1 g Intravenous Q24H  . docusate sodium  100 mg Oral BID  . insulin aspart  0-20 Units Subcutaneous TID WC  . loratadine  10 mg Oral Daily  . metoprolol tartrate  25 mg Oral BID  . rosuvastatin  20 mg Oral Daily  . sodium chloride  10-40 mL Intracatheter Q12H  . sodium chloride  3 mL Intravenous Q12H   Continuous Infusions:    Principal Problem:   Acute left flank pain Active Problems:   Anxiety state   Aortic valve disorder   GERD   S/P AVR (aortic valve replacement)   Chest pain   UTI (lower urinary tract infection)   Hyponatremia   Anemia   Acute blood loss anemia   Acute hyponatremia   Hypervolemia   Hydronephrosis of left kidney    Time spent: [facility] Pager [phone]. If 7PM-7AM,  please contact night-coverage  at [email or web address], password [facility] 10/25/2014, 11:19 AM  LOS: 6 days

## 2014-10-25 NOTE — Progress Notes (Signed)
PT Cancellation Note  Patient Details Name: [name] MRN: [id number] DOB: November 26, 1955   Cancelled Treatment:    Reason Eval/Treat Not Completed: PT screened, no needs identified, will sign off. Observed pt ambulating in hallway without difficulty. Spoke with pt who denied any further need for PT services. Will sign off.    Weston Anna, MPT Pager: [phone]

## 2014-10-25 NOTE — Progress Notes (Signed)
Pt refused to have IV restarted or d/c'd. No redness or swelling noted at site and flushes well.

## 2014-10-26 ENCOUNTER — Encounter (HOSPITAL_COMMUNITY): Payer: Self-pay | Admitting: Registered Nurse

## 2014-10-26 DIAGNOSIS — I359 Nonrheumatic aortic valve disorder, unspecified: Secondary | ICD-10-CM

## 2014-10-26 DIAGNOSIS — Z7901 Long term (current) use of anticoagulants: Secondary | ICD-10-CM

## 2014-10-26 LAB — BASIC METABOLIC PANEL
ANION GAP: 11 (ref 5–15)
BUN: 18 mg/dL (ref 6–23)
CALCIUM: 8.5 mg/dL (ref 8.4–10.5)
CHLORIDE: 103 mmol/L (ref 96–112)
CO2: 25 mmol/L (ref 19–32)
CREATININE: 1.13 mg/dL — AB (ref 0.50–1.10)
GFR, EST AFRICAN AMERICAN: 61 mL/min — AB (ref >90)
GFR, EST NON AFRICAN AMERICAN: 53 mL/min — AB (ref >90)
Glucose, Bld: 146 mg/dL — ABNORMAL HIGH (ref 70–99)
Potassium: 4.7 mmol/L (ref 3.5–5.1)
Sodium: 139 mmol/L (ref 135–145)

## 2014-10-26 LAB — CULTURE, BLOOD (ROUTINE X 2): Culture: NO GROWTH

## 2014-10-26 LAB — GLUCOSE, CAPILLARY
GLUCOSE-CAPILLARY: 244 mg/dL — AB (ref 70–99)
Glucose-Capillary: 129 mg/dL — ABNORMAL HIGH (ref 70–99)
Glucose-Capillary: 134 mg/dL — ABNORMAL HIGH (ref 70–99)
Glucose-Capillary: 208 mg/dL — ABNORMAL HIGH (ref 70–99)

## 2014-10-26 LAB — CBC
HCT: 29.2 % — ABNORMAL LOW (ref 36.0–46.0)
Hemoglobin: 9.4 g/dL — ABNORMAL LOW (ref 12.0–15.0)
MCH: 29.5 pg (ref 26.0–34.0)
MCHC: 32.2 g/dL (ref 30.0–36.0)
MCV: 91.5 fL (ref 78.0–100.0)
PLATELETS: 331 10*3/uL (ref 150–400)
RBC: 3.19 MIL/uL — AB (ref 3.87–5.11)
RDW: 14.4 % (ref 11.5–15.5)
WBC: 8.8 10*3/uL (ref 4.0–10.5)

## 2014-10-26 LAB — HEPARIN LEVEL (UNFRACTIONATED): Heparin Unfractionated: 0.51 IU/mL (ref 0.30–0.70)

## 2014-10-26 MED ORDER — WARFARIN SODIUM 5 MG PO TABS
5.0000 mg | ORAL_TABLET | Freq: Once | ORAL | Status: AC
  Administered 2014-10-26: 5 mg via ORAL
  Filled 2014-10-26: qty 1

## 2014-10-26 MED ORDER — HEPARIN (PORCINE) IN NACL 100-0.45 UNIT/ML-% IJ SOLN
1300.0000 [IU]/h | INTRAMUSCULAR | Status: DC
  Administered 2014-10-26 – 2014-10-27 (×3): 1500 [IU]/h via INTRAVENOUS
  Filled 2014-10-26 (×4): qty 250

## 2014-10-26 MED ORDER — WARFARIN - PHARMACIST DOSING INPATIENT
Freq: Every day | Status: DC
  Administered 2014-10-26: 18:00:00

## 2014-10-26 NOTE — Progress Notes (Signed)
ANTICOAGULATION CONSULT NOTE - Initial Consult  Pharmacy Consult for warfarin and heparin Indication: bridging for mechanical valve  Allergies  Allergen Reactions  . Atorvastatin     REACTION: myalgias Lipitor  . Codeine     REACTION: N/V  . Morphine And Related Nausea And Vomiting  . Niacin     REACTION: flushing  . Percocet [Oxycodone-Acetaminophen] Other (See Comments)    "blisters in mouth took in 1995"    Patient Measurements: Height: 5\' 2"  (157.5 cm) Weight: 174 lb 8 oz (79.153 kg) IBW/kg (Calculated) : 50.1   Vital Signs: Temp: 98.2 F (36.8 C) (03/16 0549) Temp Source: Oral (03/16 0549) BP: 119/57 mmHg (03/16 0549) Pulse Rate: 73 (03/16 0549)  Labs:  Recent Labs  10/23/14 1600 10/24/14 0445 10/25/14 0435 10/26/14 0520  HGB 9.9* 8.9* 9.4* 9.4*  HCT 31.1* 27.3* 29.2* 29.2*  PLT  --  287 305 331  HEPARINUNFRC 0.19*  --   --   --   CREATININE  --  1.12* 1.12* 1.13*    Estimated Creatinine Clearance: 53.5 mL/min (by C-G formula based on Cr of 1.13).   Medical History: Past Medical History  Diagnosis Date  . Hyperlipidemia   . Multinodular thyroid   . GERD (gastroesophageal reflux disease)   . Hx of hysterectomy   . Palpitation   . Aortic stenosis   . Heart murmur     aortic stenosis - dr. Burt Knack is her cardiologist  . Angina     chest tightness and pressure due to aortic stenosis  . Shortness of breath     associated with as  . Diabetes mellitus     newly diagnosed in july, 2012  . Headache(784.0)     migraines when younger  . PONV (postoperative nausea and vomiting)     Assessment: 59 y.o F on coumadin PTA for AVR. She is S/p cystoscopy, left ureteroscopy/stent placement for left hydronephrosis. Pharmacy consulted to resume warfarin and heparin for bridging.  Home regimen: 2.5mg  daily except 5mg  on MWF INR 1.55 (3/10) H/H low but stable Scr 1.1, Crcl ~21mls/min  Significant events: -3/9: heparin started, first HL <0.1 on  1100units/hr, increased to 1450units/hr.  -3/10: HL 0.45 but drawn only ~2.5 hours after the rate change. Cont same.  HL 0.3, increased to 1500units/hr. -3/11: AM HL 0.15 on 1500units/hr so it was increased to 1900units/hr. Then heparin was turned off this AM on call to OR. Cysto/Stent/Biopsy ~ 1600. CBC ok & no bleeding reported/documented. -3/12: Heparin resumed at 0030 at rate of 1900 units/hr, 0600 HL is 0.40 units/ml (in range), level drawn from PICC line. CBC low, stable with Plt wnl. Repeat Hep levels drawn off PICC line elevated, rate reduction x2 -3/13 HL 0.71 on 1600 units/hr, increased hematuria with clots overnight-heparin discontinued  Goal of Therapy:  Heparin level 0.3-0.7 units/ml  INR 2-3 Monitor platelets by anticoagulation protocol: Yes  Plan:  Start heparin drip @ 1500 Units/hr (no bolus)  Warfarin 5mg  x 1 tonight @ 1800  Daily INR/CBC  Heparin level in 6 hours  Follow for signs and symptoms of bleeding   Dolly Rias RPh 10/26/2014, 1:52 PM Pager [phone]

## 2014-10-26 NOTE — Progress Notes (Signed)
PROGRESS NOTE  [name] RKY:[id number] DOB: 08/10/1956 DOA: 10/19/2014 PCP: Ileana Roup, MD  Assessment/Plan: left flank pain/gross hematuria/hydronephrosis -Patient with increased flank pain/renal colic -CT of the abdomen and pelvis with left renal pelvic clot versus possible urothelial mass.  -gross hematuria was improving--no further clots.  -Patient has been seen by urology -10/22/14--s/p cystoscopy, left retrograde, left ureteroscopy, with biopsy and stent placement.  -Initially off heparin drip. Coumadin on hold. Hemoglobin stable.  -renal US negative -10/26/14--restart coumadin with Heparin bridge  urinary tract infection Patient was on Keflex as outpatient.  -As expected Urine cultures at that time were negative.  -D/C IV Rocephin.  Poor IV access -pt hesitant to start heparin as she will require venipuncture for levels -If stable on heparin-->hopefully transition to Lovenox in 24 hours with which she will go home (pending clearance by Dr. Tresa Moore)  hyponatremia/hypokalemia -Improved with hydration. Hypokalemia likely due to diuresis. Repleted.   Atypical chest pain -Patient denies any further chest pain.  -Patient states this is her chronic chest pain.  -Cardiac enzymes negative 3.  -2-D echo with EF of 60-65% with no wall motion abnormalities. Aortic valve with moderate stenosis.  Volume overload vs acute diastolic CHF exac -clinical improvement after diureses.  -CXR was c/w volume overload.  -lasix 40mg  IV x 2 with significant clinical improvement.  -currently euvolemic.   acute blood loss anemia/anemia -secondary to gross hematuria. -Hgb now stable  status post aortic valve replacement -hemoglobin stabilized at 9.4.  -left flank pain improved. Patient has been seen by urology and recommending holding heparin for the next 24-48 hours.  -restart coumadin with heparin bridge  gastroesophageal reflux disease PPI.   Code Status:  Full Family Communication: Updated husband at bedside. Disposition Plan: home in 1-2 days           Procedures/Studies: Ct Abdomen Pelvis W Wo Contrast  10/19/2014   CLINICAL DATA:  59 year old female with recent history of urinary tract infection, recently passing blood clots in her urine. Abdominal pain since yesterday. Severe left flank pain since this morning. Unable to urinate.  EXAM: CT ABDOMEN AND PELVIS WITHOUT AND WITH CONTRAST  TECHNIQUE: Multidetector CT imaging of the abdomen and pelvis was performed following the standard protocol before and following the bolus administration of intravenous contrast.  CONTRAST:  192mL OMNIPAQUE IOHEXOL 300 MG/ML  SOLN  COMPARISON:  CT of the abdomen and pelvis 10/16/2014.  FINDINGS: Lower chest: Mild dependent atelectasis in the lower lobes of the lungs bilaterally (left greater than right). Mechanical aortic valve. Status post median sternotomy.  Hepatobiliary: Mild diffuse hepatic steatosis, with area of more significant focal fatty infiltration in the central aspect of segment 4B of the liver, and adjacent to the falciform ligament anteriorly in segment 4B, similar to the prior examination. There is an additional hypovascular lesion measuring 1 cm in the periphery of segment 6 (image 30 of series 6), which is unchanged in size compared to prior study 04/15/2012, and although technically indeterminate, considered benign. No other suspicious appearing hepatic lesions are noted. No intra or extrahepatic biliary ductal dilatation. Gallbladder is normal in appearance.  Pancreas: Unremarkable.  Spleen: Unremarkable.  Adrenals/Urinary Tract: No calcifications are identified within the collecting system of either kidney, along the course of either ureter, or within the lumen of the urinary bladder. Right kidney is normal in appearance. New moderate left-sided hydronephrosis. Notably, the left renal pelvis, most proximal aspect of the left ureter, and  the lower  pole collecting system of the left kidney contain high attenuation material (45 Hounsfield units), favored to represent blood. Extensive left-sided perinephric stranding. Generalized hypoperfusion throughout the renal parenchyma of the left kidney as compared to the right, without frank striated nephrogram. Delayed images demonstrate no significant excretion of contrast material by the left kidney. Normal right-sided excretion is noted. Urinary bladder is generally normal in appearance, with exception of a nondependent locule of gas, presumably iatrogenic related to recent catheterization.  Stomach/Bowel: Normal appearance of the stomach. No pathologic dilatation of small bowel or colon. Normal appendix.  Vascular/Lymphatic: Atherosclerosis throughout the abdominal and pelvic vasculature, without evidence of aneurysm or dissection. Left renal artery and vein are well visualized and are both widely patent. Numerous nonenlarged retroperitoneal lymph nodes are noted, likely reactive. No pathologically enlarged lymph nodes identified in the abdomen or pelvis.  Reproductive: Status post hysterectomy.  Ovaries are atrophic.  Other: No significant volume of ascites.  No pneumoperitoneum.  Musculoskeletal: There are no aggressive appearing lytic or blastic lesions noted in the visualized portions of the skeleton.  IMPRESSION: 1. Interval development of moderate left-sided hydronephrosis and extensive left-sided perinephric stranding, which is likely related to obstructing clot in the left renal collecting system and proximal left ureter. There is generalized hypoperfusion of the left kidney, which is favored to be related to this obstruction. No frank striated nephrogram is identified at this time to strongly suggest pyelonephritis, although correlation with urinalysis is recommended. 2. No urinary tract calculi. 3. Normal appendix. 4. Additional incidental findings, as above. These results were called by telephone at the  time of interpretation on 10/19/2014 at 4:10 pm to Dr. Irine Seal, who verbally acknowledged these results.   Electronically Signed   By: Vinnie Langton M.D.   On: 10/19/2014 16:10   Dg Chest 2 View  10/13/2014   CLINICAL DATA:  Cough and congestion for 1 month.  Ex-smoker.  EXAM: CHEST  2 VIEW  COMPARISON:  03/02/2012 and CT of 04/15/2012  FINDINGS: Median sternotomy for aortic valve repair. Midline trachea. Normal heart size. Prominent right-sided epicardial fat pad, similar. Mediastinal contours otherwise within normal limits. No pleural effusion or pneumothorax. Mildly low lung volumes. Clear lungs.  IMPRESSION: No acute cardiopulmonary disease.   Electronically Signed   By: Abigail Miyamoto M.D.   On: 10/13/2014 08:22   US Renal  10/23/2014   CLINICAL DATA:  60 year old female with history of left-sided flank pain. Recent history of left-sided hydronephrosis noted on CT scan 10/19/2014, status post stent placement on 10/21/2014. Hematuria.  EXAM: RENAL/URINARY TRACT ULTRASOUND COMPLETE  COMPARISON:  CT of the abdomen and pelvis 10/19/2014. Abdominal ultrasound 10/19/2014.  FINDINGS: Right Kidney:  Length: 11.1. Echogenicity within normal limits. No mass or hydronephrosis visualized.  Left Kidney:  Length: 12.0. Echogenicity within normal limits. No mass visualized. Mild fullness in the left renal collecting system. Echogenic structure in the left renal pelvis, presumably the left renal stent, difficult to visualize.  Bladder:  Small amount of echogenic material in the left side of the urinary bladder near the left ureterovesicular junction, suspicious for clot associated with the distal end of left ureteral stent.  IMPRESSION: 1. Interval placement of left-sided ureteral stent which is poorly visualized by ultrasound examination. There is some echogenic material adjacent to the distal end of the stent within the urinary bladder, which may represent some adherent blood clot. Mild fullness of the left renal  collecting system, without frank hydronephrosis.   Electronically Signed  By: Vinnie Langton M.D.   On: 10/23/2014 14:37   US Renal  10/19/2014   CLINICAL DATA:  Hematuria and left flank pain for 4 days.  EXAM: RENAL/URINARY TRACT ULTRASOUND COMPLETE  COMPARISON:  CT abdomen and pelvis 10/16/2014  FINDINGS: Right Kidney:  Length: 10.1 cm. Echogenicity within normal limits. No mass or hydronephrosis visualized.  Left Kidney:  Length: 11.3 cm. Echogenicity within normal limits. No mass visualized. At most minimal fullness of the intrarenal collecting system without hydronephrosis.  Bladder:  Foley catheter in place with decompressed bladder.  Incidental note is made of increased liver echogenicity, incompletely evaluated but suggestive of mild hepatic steatosis.  IMPRESSION: No hydronephrosis.   Electronically Signed   By: Logan Bores   On: 10/19/2014 11:33   Dg Chest Port 1 View  10/21/2014   CLINICAL DATA:  Sudden onset of shortness of breath, heaviness in chest.  EXAM: PORTABLE CHEST - 1 VIEW  COMPARISON:  10/19/2014  FINDINGS: Lung volumes remain low. Patient is post median sternotomy. Development of pulmonary edema and left pleural effusion. Cardiomediastinal contours are unchanged. There is linear atelectasis in the left midlung zone. No pneumothorax. No acute osseous abnormality.  IMPRESSION: Development of pulmonary edema and left pleural effusion, consistent with CHF.   Electronically Signed   By: Jeb Levering M.D.   On: 10/21/2014 04:38   Dg Chest Port 1 View  10/19/2014   CLINICAL DATA:  Left-sided pain  EXAM: PORTABLE CHEST - 1 VIEW  COMPARISON:  10/13/2014  FINDINGS: The heart size and mediastinal contours are within normal limits. Both lungs are clear. The visualized skeletal structures are unremarkable. Postsurgical changes are noted.  IMPRESSION: No active disease.   Electronically Signed   By: Inez Catalina M.D.   On: 10/19/2014 13:32   Dg Chest Port 1v Same Day  10/21/2014   CLINICAL  DATA:  Reassess CHF  EXAM: PORTABLE CHEST - 1 VIEW SAME DAY  COMPARISON:  Film from earlier in the same day  FINDINGS: Cardiac shadow is stable. Postoperative changes are again seen. A right-sided PICC line is now noted with the catheter tip in the mid superior vena cava in satisfactory position. Mild left basilar atelectasis is noted. The degree of vascular congestion has improved significantly in the interval from the prior exam. Mild residual changes are noted.  IMPRESSION: Significant improvement in CHF. Minimal residual atelectasis is noted.   Electronically Signed   By: Inez Catalina M.D.   On: 10/21/2014 10:11   Ct Renal Stone Study  10/17/2014   CLINICAL DATA:  LEFT flank pain beginning at 7 pm , hematuria. On Coumadin. Delayed time viral infection for 6 weeks with several rounds of antibiotics. History of diabetes.  EXAM: CT ABDOMEN AND PELVIS WITHOUT CONTRAST  TECHNIQUE: Multidetector CT imaging of the abdomen and pelvis was performed following the standard protocol without IV contrast.  COMPARISON:  MRI of the abdomen report July 08, 2000  FINDINGS: LUNG BASES: Included view of the lung bases are clear. The included heart appears mildly enlarged, no pericardial fluid collection. Partially imaged apparent aortic bowel by replacement, and median sternotomy.  KIDNEYS/BLADDER: Kidneys are orthotopic, demonstrating normal size and morphology. No nephrolithiasis, hydronephrosis; limited assessment for renal masses on this nonenhanced examination. The unopacified ureters are normal in course and caliber. Urinary bladder is partially distended and unremarkable.  SOLID ORGANS: The spleen, gallbladder, pancreas and adrenal glands are unremarkable for this non-contrast examination. Minimal focal fatty infiltration about the falciform ligament and, focal  lobulated hypodensity without mass effect within the segment 4 of the liver.  GASTROINTESTINAL TRACT: The stomach, small and large bowel are normal in course  and caliber without inflammatory changes, the sensitivity may be decreased by lack of enteric contrast. Mild sigmoid diverticulosis. Normal appendix.  PERITONEUM/RETROPERITONEUM: Aortoiliac vessels are normal in course and caliber, mild calcific atherosclerosis. No lymphadenopathy by CT size criteria. Status post hysterectomy. No intraperitoneal free fluid nor free air.  SOFT TISSUES/ OSSEOUS STRUCTURES: Nonsuspicious. Mild to moderate RIGHT hip osteoarthrosis.  IMPRESSION: No urolithiasis, obstructive uropathy nor acute intra-abdominal/pelvic process.  Indeterminate lesion in segment 4 of the liver, differential diagnosis includes hemangioma, focal fat. Prior MRI 2001 reported multiple cavernous hemangiomas in the liver, though images are not available for direct comparison.   Electronically Signed   By: Elon Alas   On: 10/17/2014 00:38         Subjective: Patient denies fevers, chills, headache, chest pain, dyspnea, nausea, vomiting, diarrhea, abdominal pain, dysuria. Hematuria has improved significantly. No further clots.   Objective: Filed Vitals:   10/25/14 1411 10/25/14 2109 10/26/14 0549 10/26/14 1428  BP: 105/52 128/65 119/57 111/58  Pulse: 81 79 73 80  Temp: 98 F (36.7 C) 98.6 F (37 C) 98.2 F (36.8 C) 98.2 F (36.8 C)  TempSrc: Oral Oral Oral Oral  Resp: 20 18 18 20   Height:      Weight:   79.153 kg (174 lb 8 oz)   SpO2: 98% 97% 100% 98%    Intake/Output Summary (Last 24 hours) at 10/26/14 1441 Last data filed at 10/26/14 1300  Gross per 24 hour  Intake    730 ml  Output   1500 ml  Net   -770 ml   Weight change: -0.68 kg (-1 lb 8 oz) Exam:   General:  Pt is alert, follows commands appropriately, not in acute distress  HEENT: No icterus, No thrush, No neck mass, [place]/AT  Cardiovascular: RRR, S1/S2, no rubs, no gallops  Respiratory: CTA bilaterally, no wheezing, no crackles, no rhonchi  Abdomen: Soft/+BS, non tender, non distended, no  guarding  Extremities: No edema, No lymphangitis, No petechiae, No rashes, no synovitis  Data Reviewed: Basic Metabolic Panel:  Recent Labs Lab 10/22/14 0530 10/23/14 0600 10/24/14 0445 10/25/14 0435 10/26/14 0520  NA 140 139 140 140 139  K 3.6 3.1* 3.9 4.0 4.7  CL 102 101 107 106 103  CO2 32 28 27 25 25   GLUCOSE 189* 171* 135* 142* 146*  BUN 14 20 14 17 18   CREATININE 0.97 1.10 1.12* 1.12* 1.13*  CALCIUM 8.5 8.2* 8.5 8.7 8.5   Liver Function Tests: No results for input(s): AST, ALT, ALKPHOS, BILITOT, PROT, ALBUMIN in the last 168 hours. No results for input(s): LIPASE, AMYLASE in the last 168 hours. No results for input(s): AMMONIA in the last 168 hours. CBC:  Recent Labs Lab 10/22/14 0530  10/23/14 0600 10/23/14 1600 10/24/14 0445 10/25/14 0435 10/26/14 0520  WBC 10.9*  --  14.4*  --  9.0 8.5 8.8  HGB 9.8*  < > 8.9* 9.9* 8.9* 9.4* 9.4*  HCT 30.1*  < > 27.1* 31.1* 27.3* 29.2* 29.2*  MCV 90.1  --  90.3  --  91.0 91.5 91.5  PLT 301  --  286  --  287 305 331  < > = values in this interval not displayed. Cardiac Enzymes:  Recent Labs Lab 10/19/14 1835 10/20/14 0216  TROPONINI <0.03 <0.03   BNP: Invalid input(s): POCBNP CBG:  Recent  Labs Lab 10/25/14 1212 10/25/14 1726 10/25/14 2114 10/26/14 0726 10/26/14 1153  GLUCAP 113* 134* 172* 129* 134*    Recent Results (from the past 240 hour(s))  Urine culture     Status: None   Collection Time: 10/16/14 10:00 PM  Result Value Ref Range Status   Specimen Description URINE, RANDOM  Final   Special Requests NONE  Final   Colony Count   Final    2,000 COLONIES/ML Performed at [facility]    Culture   Final    INSIGNIFICANT GROWTH Performed at [facility]    Report Status 10/18/2014 FINAL  Final  Urine culture     Status: None   Collection Time: 10/19/14 10:20 AM  Result Value Ref Range Status   Specimen Description URINE, CATHETERIZED  Final   Special Requests NONE  Final    Colony Count NO GROWTH Performed at [facility]   Final   Culture NO GROWTH Performed at [facility]   Final   Report Status 10/21/2014 FINAL  Final  Culture, blood (routine x 2)     Status: None   Collection Time: 10/20/14  9:37 AM  Result Value Ref Range Status   Specimen Description BLOOD RIGHT HAND  Final   Special Requests BOTTLES DRAWN AEROBIC ONLY 3CC  Final   Culture   Final    NO GROWTH 5 DAYS Note: Culture results may be compromised due to an inadequate volume of blood received in culture bottles. Performed at [facility]    Report Status 10/26/2014 FINAL  Final  Surgical PCR screen     Status: None   Collection Time: 10/21/14 12:45 PM  Result Value Ref Range Status   MRSA, PCR NEGATIVE NEGATIVE Final   Staphylococcus aureus NEGATIVE NEGATIVE Final    Comment:        The Xpert SA Assay (FDA approved for NASAL specimens in patients over 67 years of age), is one component of a comprehensive surveillance program.  Test performance has been validated by [facility] for patients greater than or equal to 61 year old. It is not intended to diagnose infection nor to guide or monitor treatment. Performed at [facility]      Scheduled Meds: . antiseptic oral rinse  7 mL Mouth Rinse BID  . aspirin  81 mg Oral Daily  . docusate sodium  100 mg Oral BID  . insulin aspart  0-20 Units Subcutaneous TID WC  . loratadine  10 mg Oral Daily  . metoprolol tartrate  25 mg Oral BID  . rosuvastatin  20 mg Oral Daily  . sodium chloride  10-40 mL Intracatheter Q12H  . sodium chloride  3 mL Intravenous Q12H  . warfarin  5 mg Oral ONCE-1800  . Warfarin - Pharmacist Dosing Inpatient   Does not apply q1800   Continuous Infusions: . heparin       Danilynn Jemison, DO  [facility] Pager [phone]  If 7PM-7AM, please contact night-coverage [email or web address] Password [email or web address] 10/26/2014, 2:41 PM   LOS: 7 days

## 2014-10-26 NOTE — Progress Notes (Signed)
Brief ANTICOAGULATION CONSULT NOTE  Pharmacy Consult for Heparin Indication: warfarin bridge w/ mechanical AVR  Medications:  Infusions:  . heparin 1,500 Units/hr (10/26/14 1616)    Assessment: 62 yoF admitted 3/9, now s/p cystoscopy, left ureteroscopy/stent placement for left hydronephrosis.  She takes warfarin for mechanical AVR.  Pharmacy is  consulted to resume warfarin and heparin for bridging.   Heparin level 0.51  RN reports no bleeding or complications, no infusion complications.   Goal of Therapy:  Heparin level 0.3-0.7 units/ml Monitor platelets by anticoagulation protocol: Yes   Plan:   Continue heparin IV infusion at 1500 units/hr  Heparin level in 6 hours to confirm level  Daily heparin level and CBC   [name] PharmD, BCPS Pager [phone] 10/26/2014 9:47 PM

## 2014-10-26 NOTE — Progress Notes (Signed)
Bleeding has stabilized and hbg stable.  Urology is ok with restarting anticoagulation and patient has been restarted on warfarin with heparin bridge.  Will sign off.  Please call with any questions.  Would recommend INR check in office in coumadin clinic on monday

## 2014-10-26 NOTE — Progress Notes (Signed)
5 Days Post-Op  Subjective:  1 - Gross Hematuria, Left Flank Pain and Hydronephrosis - pt with left renal pelvis clot by CT this admission on eval gross hematuria and left flank pain. Ureteroscopy / biopsy / stent 10/21/14 and path all benign.  PMH sig for aortic valve replacement (h/o bicuspid with progressive stenosis prior), hyst.   Today [name] is stable. Urine clearing nicely off heparin gtt, now just medium yellow, no clots..   Objective: Vital signs in last 24 hours: Temp:  [98 F (36.7 C)-98.6 F (37 C)] 98.2 F (36.8 C) (03/16 0549) Pulse Rate:  [73-81] 73 (03/16 0549) Resp:  [18-20] 18 (03/16 0549) BP: (105-128)/(52-65) 119/57 mmHg (03/16 0549) SpO2:  [97 %-100 %] 100 % (03/16 0549) Weight:  [79.153 kg (174 lb 8 oz)] 79.153 kg (174 lb 8 oz) (03/16 0549) Last BM Date: 10/26/14  Intake/Output from previous day: 03/15 0701 - 03/16 0700 In: 730 [P.O.:720; I.V.:10] Out: 1350 [Urine:1350] Intake/Output this shift: Total I/O In: 480 [P.O.:480] Out: -   General appearance: alert, cooperative and appears stated age Eyes: negative Neck: supple, symmetrical, trachea midline Back: symmetric, no curvature. ROM normal. No CVA tenderness. Resp: non-labored on room air Cardio: Nl rate GI: soft, non-tender; bowel sounds normal; no masses,  no organomegaly and moderate truncal obesity Extremities: extremities normal, atraumatic, no cyanosis or edema Pulses: 2+ and symmetric Skin: Skin color, texture, turgor normal. No rashes or lesions Lymph nodes: Cervical, supraclavicular, and axillary nodes normal. Neurologic: Grossly normal  Lab Results:   Recent Labs  10/25/14 0435 10/26/14 0520  WBC 8.5 8.8  HGB 9.4* 9.4*  HCT 29.2* 29.2*  PLT 305 331   BMET  Recent Labs  10/25/14 0435 10/26/14 0520  NA 140 139  K 4.0 4.7  CL 106 103  CO2 25 25  GLUCOSE 142* 146*  BUN 17 18  CREATININE 1.12* 1.13*  CALCIUM 8.7 8.5   PT/INR No results for input(s): LABPROT, INR in the  last 72 hours. ABG No results for input(s): PHART, HCO3 in the last 72 hours.  Invalid input(s): PCO2, PO2  Studies/Results: No results found.  Anti-infectives: Anti-infectives    Start     Dose/Rate Route Frequency Ordered Stop   10/21/14 1049  gentamicin (GARAMYCIN) 320 mg in dextrose 5 % 100 mL IVPB     5 mg/kg  63.3 kg (Adjusted) 108 mL/hr over 60 Minutes Intravenous 30 min pre-op 10/21/14 1049 10/21/14 1400   10/21/14 1041  gentamicin (GARAMYCIN) IVPB 80 mg  Status:  Discontinued     80 mg 100 mL/hr over 30 Minutes Intravenous 30 min pre-op 10/21/14 1041 10/21/14 1047   10/19/14 1315  cefTRIAXone (ROCEPHIN) 1 g in dextrose 5 % 50 mL IVPB  Status:  Discontinued     1 g 100 mL/hr over 30 Minutes Intravenous Every 24 hours 10/19/14 1301 10/25/14 1358      Assessment/Plan:   1 - Gross Hematuria, Left Flank Pain and Hydronephrosis - path all benign, likely reactive from possible UTI, then clot, then obstruction in viscous cycle, now relieved with stent, ABX.  I feel it is fine to restart anticoagulation from GU perspective at anytime. I stressed to pt that she will likely have some persistent gross hematuria on / off with stent in place, but not worrisome as long as no large clots and Hgb stable. I do not feel strongly whether to start heparin gtt for bridging or to just start coumadin again without bridging.    Giacomo Valone,  [name] 10/26/2014

## 2014-10-27 LAB — GLUCOSE, CAPILLARY
GLUCOSE-CAPILLARY: 137 mg/dL — AB (ref 70–99)
Glucose-Capillary: 139 mg/dL — ABNORMAL HIGH (ref 70–99)
Glucose-Capillary: 164 mg/dL — ABNORMAL HIGH (ref 70–99)
Glucose-Capillary: 170 mg/dL — ABNORMAL HIGH (ref 70–99)

## 2014-10-27 LAB — CBC
HCT: 28.6 % — ABNORMAL LOW (ref 36.0–46.0)
Hemoglobin: 9.2 g/dL — ABNORMAL LOW (ref 12.0–15.0)
MCH: 29.6 pg (ref 26.0–34.0)
MCHC: 32.2 g/dL (ref 30.0–36.0)
MCV: 92 fL (ref 78.0–100.0)
Platelets: 299 10*3/uL (ref 150–400)
RBC: 3.11 MIL/uL — ABNORMAL LOW (ref 3.87–5.11)
RDW: 14.5 % (ref 11.5–15.5)
WBC: 9.9 10*3/uL (ref 4.0–10.5)

## 2014-10-27 LAB — PROTIME-INR
INR: 1.09 (ref 0.00–1.49)
PROTHROMBIN TIME: 14.2 s (ref 11.6–15.2)

## 2014-10-27 LAB — HEPARIN LEVEL (UNFRACTIONATED): HEPARIN UNFRACTIONATED: 0.67 [IU]/mL (ref 0.30–0.70)

## 2014-10-27 MED ORDER — WARFARIN SODIUM 5 MG PO TABS
5.0000 mg | ORAL_TABLET | Freq: Once | ORAL | Status: AC
  Administered 2014-10-27: 5 mg via ORAL
  Filled 2014-10-27: qty 1

## 2014-10-27 NOTE — Progress Notes (Signed)
Patient called RN to evaluate her urine because it was "darker than last time." After assessing her urine, it was very bloody and considerably darker than the last urine collected.  RN educated patient on drinking fluids since she was only getting a small amount through her IV by Heparin gtt.  Patient voided again a couple hours later and was still red but much lighter and clear.  Patient encouraged to drink fluids.  Will continue to monitor urine and output.

## 2014-10-27 NOTE — Progress Notes (Signed)
ANTICOAGULATION CONSULT NOTE -   Pharmacy Consult for warfarin and heparin Indication: bridging for mechanical valve  Allergies  Allergen Reactions  . Atorvastatin     REACTION: myalgias Lipitor  . Codeine     REACTION: N/V  . Morphine And Related Nausea And Vomiting  . Niacin     REACTION: flushing  . Percocet [Oxycodone-Acetaminophen] Other (See Comments)    "blisters in mouth took in 1995"    Patient Measurements: Height: 5\' 2"  (157.5 cm) Weight: 174 lb 8 oz (79.153 kg) IBW/kg (Calculated) : 50.1   Vital Signs:    Labs:  Recent Labs  10/25/14 0435 10/26/14 0520 10/26/14 2119 10/27/14 0430  HGB 9.4* 9.4*  --  9.2*  HCT 29.2* 29.2*  --  28.6*  PLT 305 331  --  299  LABPROT  --   --   --  14.2  INR  --   --   --  1.09  HEPARINUNFRC  --   --  0.51 0.67  CREATININE 1.12* 1.13*  --   --     Estimated Creatinine Clearance: 53.5 mL/min (by C-G formula based on Cr of 1.13).   Medical History: Past Medical History  Diagnosis Date  . Hyperlipidemia   . Multinodular thyroid   . GERD (gastroesophageal reflux disease)   . Hx of hysterectomy   . Palpitation   . Aortic stenosis   . Heart murmur     aortic stenosis - dr. Burt Knack is her cardiologist  . Angina     chest tightness and pressure due to aortic stenosis  . Shortness of breath     associated with as  . Diabetes mellitus     newly diagnosed in july, 2012  . Headache(784.0)     migraines when younger  . PONV (postoperative nausea and vomiting)     Assessment: 59 y.o F on coumadin PTA for AVR. She is S/p cystoscopy, left ureteroscopy/stent placement for left hydronephrosis. Pharmacy consulted to resume warfarin and heparin for bridging.  Home regimen: 2.5mg  daily except 5mg  on MWF  Today 3/17: INR 1.09 (subtherapeutic as expected) HL 0.67 (therapeutic) H/H low but stable Scr 1.1, Crcl ~61mls/min  Significant events: -3/9: heparin started, first HL <0.1 on 1100units/hr, increased to  1450units/hr.  -3/10: HL 0.45 but drawn only ~2.5 hours after the rate change. Cont same.  HL 0.3, increased to 1500units/hr. -3/11: AM HL 0.15 on 1500units/hr so it was increased to 1900units/hr. Then heparin was turned off this AM on call to OR. Cysto/Stent/Biopsy ~ 1600. CBC ok & no bleeding reported/documented. -3/12: Heparin resumed at 0030 at rate of 1900 units/hr, 0600 HL is 0.40 units/ml (in range), level drawn from PICC line. CBC low, stable with Plt wnl. Repeat Hep levels drawn off PICC line elevated, rate reduction x2 -3/13 HL 0.71 on 1600 units/hr, increased hematuria with clots overnight-heparin discontinued -3/16 restart warfarin and heparin @1500  units/hr, 1st HL therapeutic @ 0.51  Goal of Therapy:  Heparin level 0.3-0.7 units/ml  INR 2-3 Monitor platelets by anticoagulation protocol: Yes  Plan:  Continue heparin drip @ 1500 Units/hr   Warfarin 5mg  x 1 tonight @ 1800  Daily INR/CBC  Heparin level   Follow for signs and symptoms of bleeding   Dolly Rias RPh 10/27/2014, 10:39 AM Pager [phone]

## 2014-10-27 NOTE — Progress Notes (Signed)
PROGRESS NOTE  [name] KCM:[id number] DOB: 03/31/1956 DOA: 10/19/2014 PCP: Ileana Roup, MD  Assessment/Plan: left flank pain/gross hematuria/hydronephrosis -Patient with increased flank pain/renal colic -CT of the abdomen and pelvis with left renal pelvic clot versus possible urothelial mass.  -gross hematuria was improving--no further clots.  -appreciate Dr. Tresa Moore -have educated pt that she may continue to have intermitten hematuria, but continue anticoagulation as long as not passing clots and need for recurrent blood transfusion -10/22/14--s/p cystoscopy, left retrograde, left ureteroscopy, with biopsy and stent placement.  -pathology from biopsy--benign -Initially off heparin drip. Coumadin on hold. Hemoglobin stable.  -renal US negative -10/26/14--restart coumadin with Heparin bridge-->hgb remains stable  urinary tract infection Patient was on Keflex as outpatient.  -As expected Urine cultures at that time were negative.  -D/C IV Rocephin.  Poor IV access -case discussed with Dr. Gunnar Fusi to go home with Lovenox bridge  3/18 if stable -If stable on heparin-->hopefully transition to Lovenox in 24 hours with which she will go home   hyponatremia/hypokalemia -Improved with hydration. Hypokalemia likely due to diuresis. Repleted.   Atypical chest pain -Patient denies any further chest pain.  -Patient states this is her chronic chest pain.  -Cardiac enzymes negative 3.  -2-D echo with EF of 60-65% with no wall motion abnormalities. Aortic valve with moderate stenosis.  Volume overload vs acute diastolic CHF exac -clinical improvement after diureses.  -CXR was c/w volume overload.  -lasix 40mg  IV x 2 with significant clinical improvement.  -currently euvolemic.   acute blood loss anemia/anemia -secondary to gross hematuria. -Hgb now stable  status post aortic valve replacement -hemoglobin stabilized at 9.4.  -left flank pain  improved. Patient has been seen by urology and recommending holding heparin for the next 24-48 hours.  -restart coumadin with heparin bridge-->Hgb stable  gastroesophageal reflux disease PPI.   Code Status: Full Family Communication: Updated husband at bedside. Disposition Plan: home in 1-2 days          Procedures/Studies: Ct Abdomen Pelvis W Wo Contrast  10/19/2014   CLINICAL DATA:  59 year old female with recent history of urinary tract infection, recently passing blood clots in her urine. Abdominal pain since yesterday. Severe left flank pain since this morning. Unable to urinate.  EXAM: CT ABDOMEN AND PELVIS WITHOUT AND WITH CONTRAST  TECHNIQUE: Multidetector CT imaging of the abdomen and pelvis was performed following the standard protocol before and following the bolus administration of intravenous contrast.  CONTRAST:  141mL OMNIPAQUE IOHEXOL 300 MG/ML  SOLN  COMPARISON:  CT of the abdomen and pelvis 10/16/2014.  FINDINGS: Lower chest: Mild dependent atelectasis in the lower lobes of the lungs bilaterally (left greater than right). Mechanical aortic valve. Status post median sternotomy.  Hepatobiliary: Mild diffuse hepatic steatosis, with area of more significant focal fatty infiltration in the central aspect of segment 4B of the liver, and adjacent to the falciform ligament anteriorly in segment 4B, similar to the prior examination. There is an additional hypovascular lesion measuring 1 cm in the periphery of segment 6 (image 30 of series 6), which is unchanged in size compared to prior study 04/15/2012, and although technically indeterminate, considered benign. No other suspicious appearing hepatic lesions are noted. No intra or extrahepatic biliary ductal dilatation. Gallbladder is normal in appearance.  Pancreas: Unremarkable.  Spleen: Unremarkable.  Adrenals/Urinary Tract: No calcifications are identified within the collecting system of either kidney, along the course of either  ureter, or within the  lumen of the urinary bladder. Right kidney is normal in appearance. New moderate left-sided hydronephrosis. Notably, the left renal pelvis, most proximal aspect of the left ureter, and the lower pole collecting system of the left kidney contain high attenuation material (45 Hounsfield units), favored to represent blood. Extensive left-sided perinephric stranding. Generalized hypoperfusion throughout the renal parenchyma of the left kidney as compared to the right, without frank striated nephrogram. Delayed images demonstrate no significant excretion of contrast material by the left kidney. Normal right-sided excretion is noted. Urinary bladder is generally normal in appearance, with exception of a nondependent locule of gas, presumably iatrogenic related to recent catheterization.  Stomach/Bowel: Normal appearance of the stomach. No pathologic dilatation of small bowel or colon. Normal appendix.  Vascular/Lymphatic: Atherosclerosis throughout the abdominal and pelvic vasculature, without evidence of aneurysm or dissection. Left renal artery and vein are well visualized and are both widely patent. Numerous nonenlarged retroperitoneal lymph nodes are noted, likely reactive. No pathologically enlarged lymph nodes identified in the abdomen or pelvis.  Reproductive: Status post hysterectomy.  Ovaries are atrophic.  Other: No significant volume of ascites.  No pneumoperitoneum.  Musculoskeletal: There are no aggressive appearing lytic or blastic lesions noted in the visualized portions of the skeleton.  IMPRESSION: 1. Interval development of moderate left-sided hydronephrosis and extensive left-sided perinephric stranding, which is likely related to obstructing clot in the left renal collecting system and proximal left ureter. There is generalized hypoperfusion of the left kidney, which is favored to be related to this obstruction. No frank striated nephrogram is identified at this time to strongly  suggest pyelonephritis, although correlation with urinalysis is recommended. 2. No urinary tract calculi. 3. Normal appendix. 4. Additional incidental findings, as above. These results were called by telephone at the time of interpretation on 10/19/2014 at 4:10 pm to Dr. Irine Seal, who verbally acknowledged these results.   Electronically Signed   By: Vinnie Langton M.D.   On: 10/19/2014 16:10   Dg Chest 2 View  10/13/2014   CLINICAL DATA:  Cough and congestion for 1 month.  Ex-smoker.  EXAM: CHEST  2 VIEW  COMPARISON:  03/02/2012 and CT of 04/15/2012  FINDINGS: Median sternotomy for aortic valve repair. Midline trachea. Normal heart size. Prominent right-sided epicardial fat pad, similar. Mediastinal contours otherwise within normal limits. No pleural effusion or pneumothorax. Mildly low lung volumes. Clear lungs.  IMPRESSION: No acute cardiopulmonary disease.   Electronically Signed   By: Abigail Miyamoto M.D.   On: 10/13/2014 08:22   US Renal  10/23/2014   CLINICAL DATA:  59 year old female with history of left-sided flank pain. Recent history of left-sided hydronephrosis noted on CT scan 10/19/2014, status post stent placement on 10/21/2014. Hematuria.  EXAM: RENAL/URINARY TRACT ULTRASOUND COMPLETE  COMPARISON:  CT of the abdomen and pelvis 10/19/2014. Abdominal ultrasound 10/19/2014.  FINDINGS: Right Kidney:  Length: 11.1. Echogenicity within normal limits. No mass or hydronephrosis visualized.  Left Kidney:  Length: 12.0. Echogenicity within normal limits. No mass visualized. Mild fullness in the left renal collecting system. Echogenic structure in the left renal pelvis, presumably the left renal stent, difficult to visualize.  Bladder:  Small amount of echogenic material in the left side of the urinary bladder near the left ureterovesicular junction, suspicious for clot associated with the distal end of left ureteral stent.  IMPRESSION: 1. Interval placement of left-sided ureteral stent which is poorly  visualized by ultrasound examination. There is some echogenic material adjacent to the distal end of the stent within  the urinary bladder, which may represent some adherent blood clot. Mild fullness of the left renal collecting system, without frank hydronephrosis.   Electronically Signed   By: Vinnie Langton M.D.   On: 10/23/2014 14:37   US Renal  10/19/2014   CLINICAL DATA:  Hematuria and left flank pain for 4 days.  EXAM: RENAL/URINARY TRACT ULTRASOUND COMPLETE  COMPARISON:  CT abdomen and pelvis 10/16/2014  FINDINGS: Right Kidney:  Length: 10.1 cm. Echogenicity within normal limits. No mass or hydronephrosis visualized.  Left Kidney:  Length: 11.3 cm. Echogenicity within normal limits. No mass visualized. At most minimal fullness of the intrarenal collecting system without hydronephrosis.  Bladder:  Foley catheter in place with decompressed bladder.  Incidental note is made of increased liver echogenicity, incompletely evaluated but suggestive of mild hepatic steatosis.  IMPRESSION: No hydronephrosis.   Electronically Signed   By: Logan Bores   On: 10/19/2014 11:33   Dg Chest Port 1 View  10/21/2014   CLINICAL DATA:  Sudden onset of shortness of breath, heaviness in chest.  EXAM: PORTABLE CHEST - 1 VIEW  COMPARISON:  10/19/2014  FINDINGS: Lung volumes remain low. Patient is post median sternotomy. Development of pulmonary edema and left pleural effusion. Cardiomediastinal contours are unchanged. There is linear atelectasis in the left midlung zone. No pneumothorax. No acute osseous abnormality.  IMPRESSION: Development of pulmonary edema and left pleural effusion, consistent with CHF.   Electronically Signed   By: Jeb Levering M.D.   On: 10/21/2014 04:38   Dg Chest Port 1 View  10/19/2014   CLINICAL DATA:  Left-sided pain  EXAM: PORTABLE CHEST - 1 VIEW  COMPARISON:  10/13/2014  FINDINGS: The heart size and mediastinal contours are within normal limits. Both lungs are clear. The visualized skeletal  structures are unremarkable. Postsurgical changes are noted.  IMPRESSION: No active disease.   Electronically Signed   By: Inez Catalina M.D.   On: 10/19/2014 13:32   Dg Chest Port 1v Same Day  10/21/2014   CLINICAL DATA:  Reassess CHF  EXAM: PORTABLE CHEST - 1 VIEW SAME DAY  COMPARISON:  Film from earlier in the same day  FINDINGS: Cardiac shadow is stable. Postoperative changes are again seen. A right-sided PICC line is now noted with the catheter tip in the mid superior vena cava in satisfactory position. Mild left basilar atelectasis is noted. The degree of vascular congestion has improved significantly in the interval from the prior exam. Mild residual changes are noted.  IMPRESSION: Significant improvement in CHF. Minimal residual atelectasis is noted.   Electronically Signed   By: Inez Catalina M.D.   On: 10/21/2014 10:11   Ct Renal Stone Study  10/17/2014   CLINICAL DATA:  LEFT flank pain beginning at 7 pm , hematuria. On Coumadin. Delayed time viral infection for 6 weeks with several rounds of antibiotics. History of diabetes.  EXAM: CT ABDOMEN AND PELVIS WITHOUT CONTRAST  TECHNIQUE: Multidetector CT imaging of the abdomen and pelvis was performed following the standard protocol without IV contrast.  COMPARISON:  MRI of the abdomen report July 08, 2000  FINDINGS: LUNG BASES: Included view of the lung bases are clear. The included heart appears mildly enlarged, no pericardial fluid collection. Partially imaged apparent aortic bowel by replacement, and median sternotomy.  KIDNEYS/BLADDER: Kidneys are orthotopic, demonstrating normal size and morphology. No nephrolithiasis, hydronephrosis; limited assessment for renal masses on this nonenhanced examination. The unopacified ureters are normal in course and caliber. Urinary bladder is partially distended and  unremarkable.  SOLID ORGANS: The spleen, gallbladder, pancreas and adrenal glands are unremarkable for this non-contrast examination. Minimal focal  fatty infiltration about the falciform ligament and, focal lobulated hypodensity without mass effect within the segment 4 of the liver.  GASTROINTESTINAL TRACT: The stomach, small and large bowel are normal in course and caliber without inflammatory changes, the sensitivity may be decreased by lack of enteric contrast. Mild sigmoid diverticulosis. Normal appendix.  PERITONEUM/RETROPERITONEUM: Aortoiliac vessels are normal in course and caliber, mild calcific atherosclerosis. No lymphadenopathy by CT size criteria. Status post hysterectomy. No intraperitoneal free fluid nor free air.  SOFT TISSUES/ OSSEOUS STRUCTURES: Nonsuspicious. Mild to moderate RIGHT hip osteoarthrosis.  IMPRESSION: No urolithiasis, obstructive uropathy nor acute intra-abdominal/pelvic process.  Indeterminate lesion in segment 4 of the liver, differential diagnosis includes hemangioma, focal fat. Prior MRI 2001 reported multiple cavernous hemangiomas in the liver, though images are not available for direct comparison.   Electronically Signed   By: Elon Alas   On: 10/17/2014 00:38         Subjective: Patient continues to have intermittent hematuria without clots. Denies any fevers, chills, chest pain, shortness of breath, nausea, vomiting, diarrhea, abdominal pain.  Objective: Filed Vitals:   10/25/14 1411 10/25/14 2109 10/26/14 0549 10/26/14 1428  BP: 105/52 128/65 119/57 111/58  Pulse: 81 79 73 80  Temp: 98 F (36.7 C) 98.6 F (37 C) 98.2 F (36.8 C) 98.2 F (36.8 C)  TempSrc: Oral Oral Oral Oral  Resp: 20 18 18 20   Height:      Weight:   79.153 kg (174 lb 8 oz)   SpO2: 98% 97% 100% 98%    Intake/Output Summary (Last 24 hours) at 10/27/14 1809 Last data filed at 10/27/14 1500  Gross per 24 hour  Intake 131.75 ml  Output    950 ml  Net -818.25 ml   Weight change:  Exam:   General:  Pt is alert, follows commands appropriately, not in acute distress  HEENT: No icterus, No thrush,   [place]/AT  Cardiovascular: RRR, S1/S2, no rubs, no gallops  Respiratory: CTA bilaterally, no wheezing, no crackles, no rhonchi  Abdomen: Soft/+BS, non tender, non distended, no guarding  Extremities: No edema, No lymphangitis, No petechiae, No rashes, no synovitis  Data Reviewed: Basic Metabolic Panel:  Recent Labs Lab 10/22/14 0530 10/23/14 0600 10/24/14 0445 10/25/14 0435 10/26/14 0520  NA 140 139 140 140 139  K 3.6 3.1* 3.9 4.0 4.7  CL 102 101 107 106 103  CO2 32 28 27 25 25   GLUCOSE 189* 171* 135* 142* 146*  BUN 14 20 14 17 18   CREATININE 0.97 1.10 1.12* 1.12* 1.13*  CALCIUM 8.5 8.2* 8.5 8.7 8.5   Liver Function Tests: No results for input(s): AST, ALT, ALKPHOS, BILITOT, PROT, ALBUMIN in the last 168 hours. No results for input(s): LIPASE, AMYLASE in the last 168 hours. No results for input(s): AMMONIA in the last 168 hours. CBC:  Recent Labs Lab 10/23/14 0600 10/23/14 1600 10/24/14 0445 10/25/14 0435 10/26/14 0520 10/27/14 0430  WBC 14.4*  --  9.0 8.5 8.8 9.9  HGB 8.9* 9.9* 8.9* 9.4* 9.4* 9.2*  HCT 27.1* 31.1* 27.3* 29.2* 29.2* 28.6*  MCV 90.3  --  91.0 91.5 91.5 92.0  PLT 286  --  287 305 331 299   Cardiac Enzymes: No results for input(s): CKTOTAL, CKMB, CKMBINDEX, TROPONINI in the last 168 hours. BNP: Invalid input(s): POCBNP CBG:  Recent Labs Lab 10/26/14 1705 10/26/14 2119 10/27/14 0653 10/27/14  1134 10/27/14 1652  GLUCAP 208* 244* 139* 164* 137*    Recent Results (from the past 240 hour(s))  Urine culture     Status: None   Collection Time: 10/19/14 10:20 AM  Result Value Ref Range Status   Specimen Description URINE, CATHETERIZED  Final   Special Requests NONE  Final   Colony Count NO GROWTH Performed at [facility]   Final   Culture NO GROWTH Performed at [facility]   Final   Report Status 10/21/2014 FINAL  Final  Culture, blood (routine x 2)     Status: None   Collection Time: 10/20/14  9:37 AM  Result Value  Ref Range Status   Specimen Description BLOOD RIGHT HAND  Final   Special Requests BOTTLES DRAWN AEROBIC ONLY 3CC  Final   Culture   Final    NO GROWTH 5 DAYS Note: Culture results may be compromised due to an inadequate volume of blood received in culture bottles. Performed at [facility]    Report Status 10/26/2014 FINAL  Final  Surgical PCR screen     Status: None   Collection Time: 10/21/14 12:45 PM  Result Value Ref Range Status   MRSA, PCR NEGATIVE NEGATIVE Final   Staphylococcus aureus NEGATIVE NEGATIVE Final    Comment:        The Xpert SA Assay (FDA approved for NASAL specimens in patients over 28 years of age), is one component of a comprehensive surveillance program.  Test performance has been validated by [facility] for patients greater than or equal to 55 year old. It is not intended to diagnose infection nor to guide or monitor treatment. Performed at [facility]      Scheduled Meds: . antiseptic oral rinse  7 mL Mouth Rinse BID  . aspirin  81 mg Oral Daily  . docusate sodium  100 mg Oral BID  . insulin aspart  0-20 Units Subcutaneous TID WC  . loratadine  10 mg Oral Daily  . metoprolol tartrate  25 mg Oral BID  . rosuvastatin  20 mg Oral Daily  . sodium chloride  10-40 mL Intracatheter Q12H  . sodium chloride  3 mL Intravenous Q12H  . Warfarin - Pharmacist Dosing Inpatient   Does not apply q1800   Continuous Infusions: . heparin 1,500 Units/hr (10/27/14 0733)     Deby Adger, DO  [facility] Pager [phone]  If 7PM-7AM, please contact night-coverage [email or web address] Password [email or web address] 10/27/2014, 6:09 PM   LOS: 8 days

## 2014-10-27 NOTE — Progress Notes (Signed)
6 Days Post-Op  Subjective:  1 - Gross Hematuria, Left Flank Pain and Hydronephrosis - pt with left renal pelvis clot by CT this admission on eval gross hematuria and left flank pain. Ureteroscopy / biopsy / stent 10/21/14 and path all benign.  PMH sig for aortic valve replacement (h/o bicuspid with progressive stenosis prior), hyst.   Today [name] is stable. Urine remains w/o clots now back on heparin gtt and PO coumadin started yesterday.    Objective: Vital signs in last 24 hours: Temp:  [98.2 F (36.8 C)] 98.2 F (36.8 C) (03/16 1428) Pulse Rate:  [80] 80 (03/16 1428) Resp:  [20] 20 (03/16 1428) BP: (111)/(58) 111/58 mmHg (03/16 1428) SpO2:  [98 %] 98 % (03/16 1428) Last BM Date: 10/26/14  Intake/Output from previous day: 03/16 0701 - 03/17 0700 In: 500 [P.O.:480; I.V.:20] Out: 550 [Urine:550] Intake/Output this shift: Total I/O In: -  Out: 300 [Urine:300]  General appearance: alert, cooperative and appears stated age Eyes: negative Throat: lips, mucosa, and tongue normal; teeth and gums normal Back: symmetric, no curvature. ROM normal. No CVA tenderness. Resp: non-labored on room air Cardio: regular rate and rhythm, S1, S2 normal, no murmur, click, rub or gallop GI: soft, non-tender; bowel sounds normal; no masses,  no organomegaly Extremities: extremities normal, atraumatic, no cyanosis or edema Pulses: 2+ and symmetric Skin: Skin color, texture, turgor normal. No rashes or lesions Lymph nodes: Cervical, supraclavicular, and axillary nodes normal. Neurologic: Grossly normal  Lab Results:   Recent Labs  10/26/14 0520 10/27/14 0430  WBC 8.8 9.9  HGB 9.4* 9.2*  HCT 29.2* 28.6*  PLT 331 299   BMET  Recent Labs  10/25/14 0435 10/26/14 0520  NA 140 139  K 4.0 4.7  CL 106 103  CO2 25 25  GLUCOSE 142* 146*  BUN 17 18  CREATININE 1.12* 1.13*  CALCIUM 8.7 8.5   PT/INR  Recent Labs  10/27/14 0430  LABPROT 14.2  INR 1.09   ABG No results for  input(s): PHART, HCO3 in the last 72 hours.  Invalid input(s): PCO2, PO2  Studies/Results: No results found.  Anti-infectives: Anti-infectives    Start     Dose/Rate Route Frequency Ordered Stop   10/21/14 1049  gentamicin (GARAMYCIN) 320 mg in dextrose 5 % 100 mL IVPB     5 mg/kg  63.3 kg (Adjusted) 108 mL/hr over 60 Minutes Intravenous 30 min pre-op 10/21/14 1049 10/21/14 1400   10/21/14 1041  gentamicin (GARAMYCIN) IVPB 80 mg  Status:  Discontinued     80 mg 100 mL/hr over 30 Minutes Intravenous 30 min pre-op 10/21/14 1041 10/21/14 1047   10/19/14 1315  cefTRIAXone (ROCEPHIN) 1 g in dextrose 5 % 50 mL IVPB  Status:  Discontinued     1 g 100 mL/hr over 30 Minutes Intravenous Every 24 hours 10/19/14 1301 10/25/14 1358      Assessment/Plan:  1 - Gross Hematuria, Left Flank Pain and Hydronephrosis - path all benign, likely reactive from possible UTI, then clot, then obstruction in viscous cycle, now relieved with stent, ABX.  I agree with probable DC tomorrow as long as no recurrence of hematuria with large clots. REinforced to pt some visible blood will likley persist with stent in place.  2 - WE will arrange for GU follow up for office stent removal in few weeks, call with questions anytime.   [facility], Tyannah Sane 10/27/2014

## 2014-10-28 ENCOUNTER — Telehealth: Payer: Self-pay | Admitting: Cardiovascular Disease

## 2014-10-28 LAB — CBC
HCT: 30.6 % — ABNORMAL LOW (ref 36.0–46.0)
Hemoglobin: 9.6 g/dL — ABNORMAL LOW (ref 12.0–15.0)
MCH: 29 pg (ref 26.0–34.0)
MCHC: 31.4 g/dL (ref 30.0–36.0)
MCV: 92.4 fL (ref 78.0–100.0)
PLATELETS: 402 10*3/uL — AB (ref 150–400)
RBC: 3.31 MIL/uL — AB (ref 3.87–5.11)
RDW: 14.5 % (ref 11.5–15.5)
WBC: 10 10*3/uL (ref 4.0–10.5)

## 2014-10-28 LAB — GLUCOSE, CAPILLARY: GLUCOSE-CAPILLARY: 154 mg/dL — AB (ref 70–99)

## 2014-10-28 LAB — PROTIME-INR
INR: 1.15 (ref 0.00–1.49)
Prothrombin Time: 14.9 seconds (ref 11.6–15.2)

## 2014-10-28 LAB — HEPARIN LEVEL (UNFRACTIONATED): Heparin Unfractionated: 1.02 IU/mL — ABNORMAL HIGH (ref 0.30–0.70)

## 2014-10-28 MED ORDER — ENOXAPARIN SODIUM 80 MG/0.8ML ~~LOC~~ SOLN
80.0000 mg | Freq: Two times a day (BID) | SUBCUTANEOUS | Status: DC
  Administered 2014-10-28: 80 mg via SUBCUTANEOUS
  Filled 2014-10-28: qty 0.8

## 2014-10-28 MED ORDER — ENOXAPARIN SODIUM 80 MG/0.8ML ~~LOC~~ SOLN: 80.0000 mg | Freq: Two times a day (BID) | SUBCUTANEOUS | Status: DC

## 2014-10-28 MED ORDER — ENOXAPARIN SODIUM 80 MG/0.8ML ~~LOC~~ SOLN
80.0000 mg | Freq: Two times a day (BID) | SUBCUTANEOUS | Status: DC
  Filled 2014-10-28: qty 0.8

## 2014-10-28 MED ORDER — WARFARIN SODIUM 5 MG PO TABS
5.0000 mg | ORAL_TABLET | Freq: Once | ORAL | Status: DC
  Filled 2014-10-28: qty 1

## 2014-10-28 MED ORDER — METFORMIN HCL 850 MG PO TABS: 850.0000 mg | ORAL_TABLET | Freq: Every evening | ORAL | Status: DC

## 2014-10-28 MED ORDER — ENOXAPARIN (LOVENOX) PATIENT EDUCATION KIT
PACK | Freq: Once | Status: DC
  Filled 2014-10-28: qty 1

## 2014-10-28 NOTE — Telephone Encounter (Signed)
New Message  Pt called states that she was released with blood thinners. Pt states that they made her take a shot before she left or they wouldn't release her and she is requesting a call back to discuss what was prescribed.

## 2014-10-28 NOTE — Progress Notes (Signed)
ANTICOAGULATION CONSULT NOTE -   Pharmacy Consult for warfarin and heparin Indication: bridging for mechanical valve  Allergies  Allergen Reactions  . Atorvastatin     REACTION: myalgias Lipitor  . Codeine     REACTION: N/V  . Morphine And Related Nausea And Vomiting  . Niacin     REACTION: flushing  . Percocet [Oxycodone-Acetaminophen] Other (See Comments)    "blisters in mouth took in 1995"    Patient Measurements: Height: 5\' 2"  (157.5 cm) Weight: 175 lb 9.6 oz (79.652 kg) IBW/kg (Calculated) : 50.1   Vital Signs: Temp: 98.2 F (36.8 C) (03/18 0548) Temp Source: Oral (03/18 0548) BP: 128/65 mmHg (03/18 0548) Pulse Rate: 66 (03/18 0548)  Labs:  Recent Labs  10/26/14 0520 10/26/14 2119 10/27/14 0430 10/28/14 0530  HGB 9.4*  --  9.2* 9.6*  HCT 29.2*  --  28.6* 30.6*  PLT 331  --  299 402*  LABPROT  --   --  14.2 14.9  INR  --   --  1.09 1.15  HEPARINUNFRC  --  0.51 0.67 1.02*  CREATININE 1.13*  --   --   --     Estimated Creatinine Clearance: 53.7 mL/min (by C-G formula based on Cr of 1.13).   Medical History: Past Medical History  Diagnosis Date  . Hyperlipidemia   . Multinodular thyroid   . GERD (gastroesophageal reflux disease)   . Hx of hysterectomy   . Palpitation   . Aortic stenosis   . Heart murmur     aortic stenosis - dr. Burt Knack is her cardiologist  . Angina     chest tightness and pressure due to aortic stenosis  . Shortness of breath     associated with as  . Diabetes mellitus     newly diagnosed in july, 2012  . Headache(784.0)     migraines when younger  . PONV (postoperative nausea and vomiting)     Assessment: 59 y.o F on coumadin PTA for AVR. She is S/p cystoscopy, left ureteroscopy/stent placement for left hydronephrosis. Pharmacy consulted to resume warfarin and heparin for bridging.  Home regimen: 2.5mg  daily except 5mg  on MWF  Today 3/17: INR 1.15 (subtherapeutic as expected) HL 1.02 (supratherapeutic) H/H  low/stable but improving Scr 1.1, Crcl ~73mls/min  Significant events: -3/9: heparin started, first HL <0.1 on 1100units/hr, increased to 1450units/hr.  -3/10: HL 0.45 but drawn only ~2.5 hours after the rate change. Cont same.  HL 0.3, increased to 1500units/hr. -3/11: AM HL 0.15 on 1500units/hr so it was increased to 1900units/hr. Then heparin was turned off this AM on call to OR. Cysto/Stent/Biopsy ~ 1600. CBC ok & no bleeding reported/documented. -3/12: Heparin resumed at 0030 at rate of 1900 units/hr, 0600 HL is 0.40 units/ml (in range), level drawn from PICC line. CBC low, stable with Plt wnl. Repeat Hep levels drawn off PICC line elevated, rate reduction x2 -3/13 HL 0.71 on 1600 units/hr, increased hematuria with clots overnight-heparin discontinued -3/16 restart warfarin and heparin @1500  units/hr, 1st HL therapeutic @ 0.51  Goal of Therapy:  Heparin level 0.3-0.7 units/ml  INR 2-3 Monitor platelets by anticoagulation protocol: Yes  Plan:  Hold for 1 hour then resume @ 1300 Units/hr  Warfarin 5mg  x 1 tonight @ 1800  Daily INR/CBC  Heparin level in 6 hours  Follow for signs and symptoms of bleeding   Dolly Rias RPh 10/28/2014, 7:24 AM Pager [phone]

## 2014-10-28 NOTE — Discharge Summary (Signed)
Physician Discharge Summary  [name] KZS:[id number] DOB: 26-Aug-1955 DOA: 10/19/2014  PCP: Ileana Roup, MD  Admit date: 10/19/2014 Discharge date: 10/28/2014  Recommendations for Outpatient Follow-up:  1. Pt will need to follow up with PCP in 1-2 weeks post discharge 2. Please obtain BMP and CBC in 1-2 3. Follow up with coumadin clinic on 10/31/14@0845  and adjust dose for INR 2-3  Discharge Diagnoses:  left flank pain/gross hematuria/hydronephrosis -Patient presented with increased flank pain/renal colic and hematuria -Hgb 11.3 on day of admission -CT of the abdomen and pelvis with left renal pelvic clot versus possible urothelial mass.  -urology was consulted -Ureteroscopy / biopsy / L-ureteral stent 10/21/14 and path all benign -gross hematuria was improving--no further clots.  -appreciate Dr. Tresa Moore -have educated pt that she may continue to have intermitten hematuria, but continue anticoagulation as long as not passing clots and need for recurrent blood transfusion -10/22/14--s/p cystoscopy, left retrograde, left ureteroscopy, with biopsy and stent placement.  -pathology from biopsy--benign -Initially off heparin drip. Coumadin on hold. Hemoglobin stable.  -10/23/14--renal US negative--improved hydronephrosis -10/26/14--restart coumadin with Heparin bridge-->hgb remains stable -home with coumadin and lovenox (1mg /kg) bridge until INR 2-3 -pt has appt at coumadin clinic on [facility] on 10/31/14@0845   urinary tract infection Patient was on Keflex as outpatient.  -As expected Urine cultures at that time were negative.  -D/C IV Rocephin.  Poor IV access -case discussed with Dr. Gunnar Fusi to go home with Lovenox bridge 3/18 if stable -If stable on heparin-->hopefully transition to Lovenox in 24 hours with which she will go home  -transitioned to lovenox on 3/18  hyponatremia/hypokalemia -Improved with hydration. Hypokalemia likely due to diuresis.  Repleted.   Atypical chest pain -Patient denies any further chest pain.  -Patient states this is her chronic chest pain.  -Cardiac enzymes negative 3.  -2-D echo with EF of 60-65% with no wall motion abnormalities. Aortic valve with moderate stenosis.  Volume overload vs acute diastolic CHF exac -clinical improvement after diureses.  -CXR was c/w volume overload.  -lasix 40mg  IV x 2 with significant clinical improvement.  -currently euvolemic.   acute blood loss anemia/anemia -secondary to gross hematuria. -Hgb now stable--9.6 on day of d/c  status post aortic valve replacement -hemoglobin stabilized at 9.4.  -left flank pain improved. Patient has been seen by urology and recommending holding heparin for the next 24-48 hours.  -restart coumadin with heparin bridge-->Hgb stable  gastroesophageal reflux disease PPI.  Discharge Condition: stable  Disposition:  Follow-up Information    Follow up with Alexis Frock, MD In 1 week.   Specialty:  Urology   Contact information:   [place] [address] [phone]     home  Diet:carb modified Wt Readings from Last 3 Encounters:  10/28/14 79.652 kg (175 lb 9.6 oz)  01/26/14 81.534 kg (179 lb 12 oz)  07/25/13 78.926 kg (174 lb)    History of present illness:  59 y.o. female  With history of aortic stenosis status post aortic valve replacement 06/25/2011 on chronic anticoagulation therapy, gastroesophageal reflux disease, hyperlipidemia, diabetes mellitus who presents to the ED with sudden onset left flank pain. Patient states 3 days prior to admission she noticed hematuria and was passing some clots in her urine. Patient stated she subsequently presented to the ED where urinalysis was done was consistent with a UTI patient was given some IV Rocephin and discharged home on Keflex. Patient stated that 2 days prior to admission she noticed she had worsening  hematuria subsequently went to see her PCP and she  was referred to urology. Patient stated that she was seen by urologist and was recommended to continue the antibiotic until culture results returned.  Consultants: Urology--Dr. Tresa Moore  Discharge Exam: Filed Vitals:   10/28/14 0548  BP: 128/65  Pulse: 66  Temp: 98.2 F (36.8 C)  Resp: 20   Filed Vitals:   10/26/14 0549 10/26/14 1428 10/27/14 2218 10/28/14 0548  BP: 119/57 111/58 116/63 128/65  Pulse: 73 80 71 66  Temp: 98.2 F (36.8 C) 98.2 F (36.8 C) 98.1 F (36.7 C) 98.2 F (36.8 C)  TempSrc: Oral Oral Oral Oral  Resp: 18 20 20 20   Height:      Weight: 79.153 kg (174 lb 8 oz)   79.652 kg (175 lb 9.6 oz)  SpO2: 100% 98% 98% 98%   General: A&O x 3, NAD, pleasant, cooperative Cardiovascular: RRR, no rub, no gallop, no S3 Respiratory: CTAB, no wheeze, no rhonchi Abdomen:soft, nontender, nondistended, positive bowel sounds Extremities: No edema, No lymphangitis, no petechiae  Discharge Instructions      Discharge Instructions    Diet - low sodium heart healthy    Complete by:  As directed      Increase activity slowly    Complete by:  As directed             Medication List    STOP taking these medications        amoxicillin 500 MG capsule  Commonly known as:  AMOXIL     cephALEXin 500 MG capsule  Commonly known as:  KEFLEX      TAKE these medications        acetaminophen 325 MG tablet  Commonly known as:  TYLENOL  Take 650 mg by mouth every 6 (six) hours as needed for mild pain.     aspirin 81 MG tablet  Take 81 mg by mouth every morning.     cetirizine 10 MG tablet  Commonly known as:  ZYRTEC  Take 10 mg by mouth daily as needed for allergies.     CRESTOR 20 MG tablet  Generic drug:  rosuvastatin  TAKE 1 TABLET BY MOUTH ONCE A DAY     enoxaparin 80 MG/0.8ML injection  Commonly known as:  LOVENOX  Inject 0.8 mLs (80 mg total) into the skin every 12 (twelve) hours.     metFORMIN 850 MG tablet  Commonly known as:  GLUCOPHAGE  Take 1 tablet  (850 mg total) by mouth every evening.     metoprolol tartrate 25 MG tablet  Commonly known as:  LOPRESSOR  TAKE ONE TABLET BY MOUTH TWICE DAILY     tolterodine 2 MG 24 hr capsule  Commonly known as:  DETROL LA  Take 2 mg by mouth daily.     warfarin 5 MG tablet  Commonly known as:  COUMADIN  Take as directed by coumadin clinic         The results of significant diagnostics from this hospitalization (including imaging, microbiology, ancillary and laboratory) are listed below for reference.    Significant Diagnostic Studies: Ct Abdomen Pelvis W Wo Contrast  10/19/2014   CLINICAL DATA:  59 year old female with recent history of urinary tract infection, recently passing blood clots in her urine. Abdominal pain since yesterday. Severe left flank pain since this morning. Unable to urinate.  EXAM: CT ABDOMEN AND PELVIS WITHOUT AND WITH CONTRAST  TECHNIQUE: Multidetector CT imaging of the abdomen and pelvis was performed following  the standard protocol before and following the bolus administration of intravenous contrast.  CONTRAST:  172mL OMNIPAQUE IOHEXOL 300 MG/ML  SOLN  COMPARISON:  CT of the abdomen and pelvis 10/16/2014.  FINDINGS: Lower chest: Mild dependent atelectasis in the lower lobes of the lungs bilaterally (left greater than right). Mechanical aortic valve. Status post median sternotomy.  Hepatobiliary: Mild diffuse hepatic steatosis, with area of more significant focal fatty infiltration in the central aspect of segment 4B of the liver, and adjacent to the falciform ligament anteriorly in segment 4B, similar to the prior examination. There is an additional hypovascular lesion measuring 1 cm in the periphery of segment 6 (image 30 of series 6), which is unchanged in size compared to prior study 04/15/2012, and although technically indeterminate, considered benign. No other suspicious appearing hepatic lesions are noted. No intra or extrahepatic biliary ductal dilatation. Gallbladder is  normal in appearance.  Pancreas: Unremarkable.  Spleen: Unremarkable.  Adrenals/Urinary Tract: No calcifications are identified within the collecting system of either kidney, along the course of either ureter, or within the lumen of the urinary bladder. Right kidney is normal in appearance. New moderate left-sided hydronephrosis. Notably, the left renal pelvis, most proximal aspect of the left ureter, and the lower pole collecting system of the left kidney contain high attenuation material (45 Hounsfield units), favored to represent blood. Extensive left-sided perinephric stranding. Generalized hypoperfusion throughout the renal parenchyma of the left kidney as compared to the right, without frank striated nephrogram. Delayed images demonstrate no significant excretion of contrast material by the left kidney. Normal right-sided excretion is noted. Urinary bladder is generally normal in appearance, with exception of a nondependent locule of gas, presumably iatrogenic related to recent catheterization.  Stomach/Bowel: Normal appearance of the stomach. No pathologic dilatation of small bowel or colon. Normal appendix.  Vascular/Lymphatic: Atherosclerosis throughout the abdominal and pelvic vasculature, without evidence of aneurysm or dissection. Left renal artery and vein are well visualized and are both widely patent. Numerous nonenlarged retroperitoneal lymph nodes are noted, likely reactive. No pathologically enlarged lymph nodes identified in the abdomen or pelvis.  Reproductive: Status post hysterectomy.  Ovaries are atrophic.  Other: No significant volume of ascites.  No pneumoperitoneum.  Musculoskeletal: There are no aggressive appearing lytic or blastic lesions noted in the visualized portions of the skeleton.  IMPRESSION: 1. Interval development of moderate left-sided hydronephrosis and extensive left-sided perinephric stranding, which is likely related to obstructing clot in the left renal collecting system  and proximal left ureter. There is generalized hypoperfusion of the left kidney, which is favored to be related to this obstruction. No frank striated nephrogram is identified at this time to strongly suggest pyelonephritis, although correlation with urinalysis is recommended. 2. No urinary tract calculi. 3. Normal appendix. 4. Additional incidental findings, as above. These results were called by telephone at the time of interpretation on 10/19/2014 at 4:10 pm to Dr. Irine Seal, who verbally acknowledged these results.   Electronically Signed   By: Vinnie Langton M.D.   On: 10/19/2014 16:10   Dg Chest 2 View  10/13/2014   CLINICAL DATA:  Cough and congestion for 1 month.  Ex-smoker.  EXAM: CHEST  2 VIEW  COMPARISON:  03/02/2012 and CT of 04/15/2012  FINDINGS: Median sternotomy for aortic valve repair. Midline trachea. Normal heart size. Prominent right-sided epicardial fat pad, similar. Mediastinal contours otherwise within normal limits. No pleural effusion or pneumothorax. Mildly low lung volumes. Clear lungs.  IMPRESSION: No acute cardiopulmonary disease.   Electronically Signed  By: Abigail Miyamoto M.D.   On: 10/13/2014 08:22   US Renal  10/23/2014   CLINICAL DATA:  59 year old female with history of left-sided flank pain. Recent history of left-sided hydronephrosis noted on CT scan 10/19/2014, status post stent placement on 10/21/2014. Hematuria.  EXAM: RENAL/URINARY TRACT ULTRASOUND COMPLETE  COMPARISON:  CT of the abdomen and pelvis 10/19/2014. Abdominal ultrasound 10/19/2014.  FINDINGS: Right Kidney:  Length: 11.1. Echogenicity within normal limits. No mass or hydronephrosis visualized.  Left Kidney:  Length: 12.0. Echogenicity within normal limits. No mass visualized. Mild fullness in the left renal collecting system. Echogenic structure in the left renal pelvis, presumably the left renal stent, difficult to visualize.  Bladder:  Small amount of echogenic material in the left side of the urinary  bladder near the left ureterovesicular junction, suspicious for clot associated with the distal end of left ureteral stent.  IMPRESSION: 1. Interval placement of left-sided ureteral stent which is poorly visualized by ultrasound examination. There is some echogenic material adjacent to the distal end of the stent within the urinary bladder, which may represent some adherent blood clot. Mild fullness of the left renal collecting system, without frank hydronephrosis.   Electronically Signed   By: Vinnie Langton M.D.   On: 10/23/2014 14:37   US Renal  10/19/2014   CLINICAL DATA:  Hematuria and left flank pain for 4 days.  EXAM: RENAL/URINARY TRACT ULTRASOUND COMPLETE  COMPARISON:  CT abdomen and pelvis 10/16/2014  FINDINGS: Right Kidney:  Length: 10.1 cm. Echogenicity within normal limits. No mass or hydronephrosis visualized.  Left Kidney:  Length: 11.3 cm. Echogenicity within normal limits. No mass visualized. At most minimal fullness of the intrarenal collecting system without hydronephrosis.  Bladder:  Foley catheter in place with decompressed bladder.  Incidental note is made of increased liver echogenicity, incompletely evaluated but suggestive of mild hepatic steatosis.  IMPRESSION: No hydronephrosis.   Electronically Signed   By: Logan Bores   On: 10/19/2014 11:33   Dg Chest Port 1 View  10/21/2014   CLINICAL DATA:  Sudden onset of shortness of breath, heaviness in chest.  EXAM: PORTABLE CHEST - 1 VIEW  COMPARISON:  10/19/2014  FINDINGS: Lung volumes remain low. Patient is post median sternotomy. Development of pulmonary edema and left pleural effusion. Cardiomediastinal contours are unchanged. There is linear atelectasis in the left midlung zone. No pneumothorax. No acute osseous abnormality.  IMPRESSION: Development of pulmonary edema and left pleural effusion, consistent with CHF.   Electronically Signed   By: Jeb Levering M.D.   On: 10/21/2014 04:38   Dg Chest Port 1 View  10/19/2014    CLINICAL DATA:  Left-sided pain  EXAM: PORTABLE CHEST - 1 VIEW  COMPARISON:  10/13/2014  FINDINGS: The heart size and mediastinal contours are within normal limits. Both lungs are clear. The visualized skeletal structures are unremarkable. Postsurgical changes are noted.  IMPRESSION: No active disease.   Electronically Signed   By: Inez Catalina M.D.   On: 10/19/2014 13:32   Dg Chest Port 1v Same Day  10/21/2014   CLINICAL DATA:  Reassess CHF  EXAM: PORTABLE CHEST - 1 VIEW SAME DAY  COMPARISON:  Film from earlier in the same day  FINDINGS: Cardiac shadow is stable. Postoperative changes are again seen. A right-sided PICC line is now noted with the catheter tip in the mid superior vena cava in satisfactory position. Mild left basilar atelectasis is noted. The degree of vascular congestion has improved significantly in the interval from  the prior exam. Mild residual changes are noted.  IMPRESSION: Significant improvement in CHF. Minimal residual atelectasis is noted.   Electronically Signed   By: Inez Catalina M.D.   On: 10/21/2014 10:11   Ct Renal Stone Study  10/17/2014   CLINICAL DATA:  LEFT flank pain beginning at 7 pm , hematuria. On Coumadin. Delayed time viral infection for 6 weeks with several rounds of antibiotics. History of diabetes.  EXAM: CT ABDOMEN AND PELVIS WITHOUT CONTRAST  TECHNIQUE: Multidetector CT imaging of the abdomen and pelvis was performed following the standard protocol without IV contrast.  COMPARISON:  MRI of the abdomen report July 08, 2000  FINDINGS: LUNG BASES: Included view of the lung bases are clear. The included heart appears mildly enlarged, no pericardial fluid collection. Partially imaged apparent aortic bowel by replacement, and median sternotomy.  KIDNEYS/BLADDER: Kidneys are orthotopic, demonstrating normal size and morphology. No nephrolithiasis, hydronephrosis; limited assessment for renal masses on this nonenhanced examination. The unopacified ureters are normal in  course and caliber. Urinary bladder is partially distended and unremarkable.  SOLID ORGANS: The spleen, gallbladder, pancreas and adrenal glands are unremarkable for this non-contrast examination. Minimal focal fatty infiltration about the falciform ligament and, focal lobulated hypodensity without mass effect within the segment 4 of the liver.  GASTROINTESTINAL TRACT: The stomach, small and large bowel are normal in course and caliber without inflammatory changes, the sensitivity may be decreased by lack of enteric contrast. Mild sigmoid diverticulosis. Normal appendix.  PERITONEUM/RETROPERITONEUM: Aortoiliac vessels are normal in course and caliber, mild calcific atherosclerosis. No lymphadenopathy by CT size criteria. Status post hysterectomy. No intraperitoneal free fluid nor free air.  SOFT TISSUES/ OSSEOUS STRUCTURES: Nonsuspicious. Mild to moderate RIGHT hip osteoarthrosis.  IMPRESSION: No urolithiasis, obstructive uropathy nor acute intra-abdominal/pelvic process.  Indeterminate lesion in segment 4 of the liver, differential diagnosis includes hemangioma, focal fat. Prior MRI 2001 reported multiple cavernous hemangiomas in the liver, though images are not available for direct comparison.   Electronically Signed   By: Elon Alas   On: 10/17/2014 00:38     Microbiology: Recent Results (from the past 240 hour(s))  Urine culture     Status: None   Collection Time: 10/19/14 10:20 AM  Result Value Ref Range Status   Specimen Description URINE, CATHETERIZED  Final   Special Requests NONE  Final   Colony Count NO GROWTH Performed at [facility]   Final   Culture NO GROWTH Performed at [facility]   Final   Report Status 10/21/2014 FINAL  Final  Culture, blood (routine x 2)     Status: None   Collection Time: 10/20/14  9:37 AM  Result Value Ref Range Status   Specimen Description BLOOD RIGHT HAND  Final   Special Requests BOTTLES DRAWN AEROBIC ONLY 3CC  Final   Culture    Final    NO GROWTH 5 DAYS Note: Culture results may be compromised due to an inadequate volume of blood received in culture bottles. Performed at [facility]    Report Status 10/26/2014 FINAL  Final  Surgical PCR screen     Status: None   Collection Time: 10/21/14 12:45 PM  Result Value Ref Range Status   MRSA, PCR NEGATIVE NEGATIVE Final   Staphylococcus aureus NEGATIVE NEGATIVE Final    Comment:        The Xpert SA Assay (FDA approved for NASAL specimens in patients over 39 years of age), is one component of a comprehensive surveillance program.  Test performance has been validated by [facility] for patients greater than or equal to 68 year old. It is not intended to diagnose infection nor to guide or monitor treatment. Performed at [place]: Basic Metabolic Panel:  Recent Labs Lab 10/22/14 0530 10/23/14 0600 10/24/14 0445 10/25/14 0435 10/26/14 0520  NA 140 139 140 140 139  K 3.6 3.1* 3.9 4.0 4.7  CL 102 101 107 106 103  CO2 32 28 27 25 25   GLUCOSE 189* 171* 135* 142* 146*  BUN 14 20 14 17 18   CREATININE 0.97 1.10 1.12* 1.12* 1.13*  CALCIUM 8.5 8.2* 8.5 8.7 8.5   Liver Function Tests: No results for input(s): AST, ALT, ALKPHOS, BILITOT, PROT, ALBUMIN in the last 168 hours. No results for input(s): LIPASE, AMYLASE in the last 168 hours. No results for input(s): AMMONIA in the last 168 hours. CBC:  Recent Labs Lab 10/24/14 0445 10/25/14 0435 10/26/14 0520 10/27/14 0430 10/28/14 0530  WBC 9.0 8.5 8.8 9.9 10.0  HGB 8.9* 9.4* 9.4* 9.2* 9.6*  HCT 27.3* 29.2* 29.2* 28.6* 30.6*  MCV 91.0 91.5 91.5 92.0 92.4  PLT 287 305 331 299 402*   Cardiac Enzymes: No results for input(s): CKTOTAL, CKMB, CKMBINDEX, TROPONINI in the last 168 hours. BNP: Invalid input(s): POCBNP CBG:  Recent Labs Lab 10/27/14 0653 10/27/14 1134 10/27/14 1652 10/27/14 2217 10/28/14 0713  GLUCAP 139* 164* 137* 170* 154*    Time coordinating  discharge:  Greater than 30 minutes  Signed:  Ziaire Hagos, DO [facility] Pager: [phone] 10/28/2014, 8:48 AM

## 2014-10-28 NOTE — Progress Notes (Signed)
7 Days Post-Op  Subjective:  1 - Gross Hematuria, Left Flank Pain and Hydronephrosis - pt with left renal pelvis clot by CT this admission on eval gross hematuria and left flank pain. Ureteroscopy / biopsy / stent 10/21/14 and path all benign.  PMH sig for aortic valve replacement (h/o bicuspid with progressive stenosis prior), hyst.   Today [name] is without complaints. Urine continues to clear despite heparin gtt and restarted coumadin.   Objective: Vital signs in last 24 hours: Temp:  [98.1 F (36.7 C)-98.2 F (36.8 C)] 98.2 F (36.8 C) (03/18 0548) Pulse Rate:  [66-71] 66 (03/18 0548) Resp:  [20] 20 (03/18 0548) BP: (116-128)/(63-65) 128/65 mmHg (03/18 0548) SpO2:  [98 %] 98 % (03/18 0548) Weight:  [79.652 kg (175 lb 9.6 oz)] 79.652 kg (175 lb 9.6 oz) (03/18 0548) Last BM Date: 10/27/14  Intake/Output from previous day: 03/17 0701 - 03/18 0700 In: 599.3 [P.O.:360; I.V.:239.3] Out: 1500 [Urine:1500] Intake/Output this shift:    General appearance: alert, cooperative and appears stated age Eyes: negative, wearing glasses Neck: supple, symmetrical, trachea midline Back: symmetric, no curvature. ROM normal. No CVA tenderness. Resp: non-labored on room air Chest wall: no tenderness Breasts: not examined Cardio: Nl rate Pelvic: urine in hat very light pink, no clots.  Extremities: extremities normal, atraumatic, no cyanosis or edema Pulses: 2+ and symmetric Skin: Skin color, texture, turgor normal. No rashes or lesions Lymph nodes: Cervical, supraclavicular, and axillary nodes normal. Neurologic: Grossly normal  Lab Results:   Recent Labs  10/27/14 0430 10/28/14 0530  WBC 9.9 10.0  HGB 9.2* 9.6*  HCT 28.6* 30.6*  PLT 299 402*   BMET  Recent Labs  10/26/14 0520  NA 139  K 4.7  CL 103  CO2 25  GLUCOSE 146*  BUN 18  CREATININE 1.13*  CALCIUM 8.5   PT/INR  Recent Labs  10/27/14 0430 10/28/14 0530  LABPROT 14.2 14.9  INR 1.09 1.15   ABG No results  for input(s): PHART, HCO3 in the last 72 hours.  Invalid input(s): PCO2, PO2  Studies/Results: No results found.  Anti-infectives: Anti-infectives    Start     Dose/Rate Route Frequency Ordered Stop   10/21/14 1049  gentamicin (GARAMYCIN) 320 mg in dextrose 5 % 100 mL IVPB     5 mg/kg  63.3 kg (Adjusted) 108 mL/hr over 60 Minutes Intravenous 30 min pre-op 10/21/14 1049 10/21/14 1400   10/21/14 1041  gentamicin (GARAMYCIN) IVPB 80 mg  Status:  Discontinued     80 mg 100 mL/hr over 30 Minutes Intravenous 30 min pre-op 10/21/14 1041 10/21/14 1047   10/19/14 1315  cefTRIAXone (ROCEPHIN) 1 g in dextrose 5 % 50 mL IVPB  Status:  Discontinued     1 g 100 mL/hr over 30 Minutes Intravenous Every 24 hours 10/19/14 1301 10/25/14 1358      Assessment/Plan:  1 - Gross Hematuria, Left Flank Pain and Hydronephrosis - path all benign, likely reactive from possible UTI, then clot, then obstruction in viscous cycle, now relieved with stent, ABX.  I agree with probable DC today. Reinforced to pt some visible blood will likley persist with stent in place, pt has good understanding of this.   2 - WE will arrange for GU follow up for office stent removal in few weeks, call with questions anytime.   [facility], [name] 10/28/2014

## 2014-10-28 NOTE — Telephone Encounter (Signed)
I spoke with the pt and made her aware of indication for lovenox bridging. The pt is scheduled to have her INR checked in our office on Monday morning.

## 2014-10-31 ENCOUNTER — Ambulatory Visit (INDEPENDENT_AMBULATORY_CARE_PROVIDER_SITE_OTHER): Payer: Commercial Managed Care - PPO | Admitting: *Deleted

## 2014-10-31 DIAGNOSIS — Z7901 Long term (current) use of anticoagulants: Secondary | ICD-10-CM

## 2014-10-31 DIAGNOSIS — I359 Nonrheumatic aortic valve disorder, unspecified: Secondary | ICD-10-CM

## 2014-10-31 DIAGNOSIS — Z954 Presence of other heart-valve replacement: Secondary | ICD-10-CM

## 2014-10-31 DIAGNOSIS — Z5181 Encounter for therapeutic drug level monitoring: Secondary | ICD-10-CM

## 2014-10-31 DIAGNOSIS — Z952 Presence of prosthetic heart valve: Secondary | ICD-10-CM

## 2014-10-31 LAB — POCT INR: INR: 1.4

## 2014-11-03 ENCOUNTER — Other Ambulatory Visit: Payer: Self-pay | Admitting: Cardiovascular Disease

## 2014-11-03 ENCOUNTER — Telehealth: Payer: Self-pay | Admitting: Cardiovascular Disease

## 2014-11-03 ENCOUNTER — Ambulatory Visit (INDEPENDENT_AMBULATORY_CARE_PROVIDER_SITE_OTHER): Payer: Commercial Managed Care - PPO | Admitting: *Deleted

## 2014-11-03 DIAGNOSIS — Z5181 Encounter for therapeutic drug level monitoring: Secondary | ICD-10-CM | POA: Diagnosis not present

## 2014-11-03 DIAGNOSIS — Z954 Presence of other heart-valve replacement: Secondary | ICD-10-CM

## 2014-11-03 DIAGNOSIS — Z7901 Long term (current) use of anticoagulants: Secondary | ICD-10-CM | POA: Diagnosis not present

## 2014-11-03 DIAGNOSIS — Z952 Presence of prosthetic heart valve: Secondary | ICD-10-CM

## 2014-11-03 DIAGNOSIS — I359 Nonrheumatic aortic valve disorder, unspecified: Secondary | ICD-10-CM

## 2014-11-03 LAB — POCT INR: INR: 2.5

## 2014-11-03 NOTE — Telephone Encounter (Signed)
New Message    Pt calling stating that she had a stint removed from her bladder/kidney this morning and she wants to know if she needs to be put on an antibiotic. Please call back and advise.

## 2014-11-03 NOTE — Telephone Encounter (Signed)
Ignacia Bayley, NP, reviewed SBE protocol for GI/GU procedures and verified that SBE prophylaxis is not recommended for this patient. Also spoke with Dr. Zettie Pho office and they confirmed that they do not cover heart valve patients with SBE prophylaxis. Patient notified that ABX are not recommended at this time for her bladder stent removal procedure from this morning. Patient verbalized understanding and appreciation for the return phone call and information.

## 2014-11-10 ENCOUNTER — Ambulatory Visit (INDEPENDENT_AMBULATORY_CARE_PROVIDER_SITE_OTHER): Payer: Commercial Managed Care - PPO | Admitting: *Deleted

## 2014-11-10 DIAGNOSIS — I359 Nonrheumatic aortic valve disorder, unspecified: Secondary | ICD-10-CM

## 2014-11-10 DIAGNOSIS — Z954 Presence of other heart-valve replacement: Secondary | ICD-10-CM

## 2014-11-10 DIAGNOSIS — Z7901 Long term (current) use of anticoagulants: Secondary | ICD-10-CM

## 2014-11-10 DIAGNOSIS — Z952 Presence of prosthetic heart valve: Secondary | ICD-10-CM

## 2014-11-10 DIAGNOSIS — Z5181 Encounter for therapeutic drug level monitoring: Secondary | ICD-10-CM | POA: Diagnosis not present

## 2014-11-10 LAB — POCT INR: INR: 2.6

## 2014-11-24 ENCOUNTER — Ambulatory Visit (INDEPENDENT_AMBULATORY_CARE_PROVIDER_SITE_OTHER): Payer: Commercial Managed Care - PPO

## 2014-11-24 DIAGNOSIS — I359 Nonrheumatic aortic valve disorder, unspecified: Secondary | ICD-10-CM | POA: Diagnosis not present

## 2014-11-24 DIAGNOSIS — Z952 Presence of prosthetic heart valve: Secondary | ICD-10-CM

## 2014-11-24 DIAGNOSIS — Z954 Presence of other heart-valve replacement: Secondary | ICD-10-CM | POA: Diagnosis not present

## 2014-11-24 DIAGNOSIS — Z7901 Long term (current) use of anticoagulants: Secondary | ICD-10-CM

## 2014-11-24 DIAGNOSIS — Z5181 Encounter for therapeutic drug level monitoring: Secondary | ICD-10-CM | POA: Diagnosis not present

## 2014-11-24 LAB — POCT INR: INR: 3.5

## 2014-12-05 ENCOUNTER — Other Ambulatory Visit: Payer: Self-pay | Admitting: *Deleted

## 2014-12-05 MED ORDER — METFORMIN HCL 850 MG PO TABS: 850.0000 mg | ORAL_TABLET | Freq: Every evening | ORAL | Status: DC

## 2014-12-08 ENCOUNTER — Ambulatory Visit (INDEPENDENT_AMBULATORY_CARE_PROVIDER_SITE_OTHER): Payer: Commercial Managed Care - PPO | Admitting: *Deleted

## 2014-12-08 DIAGNOSIS — Z5181 Encounter for therapeutic drug level monitoring: Secondary | ICD-10-CM

## 2014-12-08 DIAGNOSIS — I359 Nonrheumatic aortic valve disorder, unspecified: Secondary | ICD-10-CM | POA: Diagnosis not present

## 2014-12-08 DIAGNOSIS — Z7901 Long term (current) use of anticoagulants: Secondary | ICD-10-CM

## 2014-12-08 DIAGNOSIS — Z954 Presence of other heart-valve replacement: Secondary | ICD-10-CM | POA: Diagnosis not present

## 2014-12-08 DIAGNOSIS — Z952 Presence of prosthetic heart valve: Secondary | ICD-10-CM

## 2014-12-08 LAB — POCT INR: INR: 2.3

## 2014-12-09 ENCOUNTER — Other Ambulatory Visit: Payer: Self-pay

## 2014-12-09 ENCOUNTER — Other Ambulatory Visit: Payer: Self-pay | Admitting: *Deleted

## 2014-12-09 MED ORDER — WARFARIN SODIUM 5 MG PO TABS: 5.0000 mg | ORAL_TABLET | ORAL | Status: DC

## 2014-12-09 MED ORDER — METOPROLOL TARTRATE 25 MG PO TABS: 25.0000 mg | ORAL_TABLET | Freq: Two times a day (BID) | ORAL | Status: DC

## 2014-12-12 ENCOUNTER — Telehealth: Payer: Self-pay | Admitting: *Deleted

## 2014-12-12 NOTE — Telephone Encounter (Signed)
Pt starting a 6 day Prednisone tapering dose pak today. Appt schedule this week.

## 2014-12-15 ENCOUNTER — Ambulatory Visit (INDEPENDENT_AMBULATORY_CARE_PROVIDER_SITE_OTHER): Payer: Commercial Managed Care - PPO

## 2014-12-15 DIAGNOSIS — Z7901 Long term (current) use of anticoagulants: Secondary | ICD-10-CM | POA: Diagnosis not present

## 2014-12-15 DIAGNOSIS — Z954 Presence of other heart-valve replacement: Secondary | ICD-10-CM | POA: Diagnosis not present

## 2014-12-15 DIAGNOSIS — Z5181 Encounter for therapeutic drug level monitoring: Secondary | ICD-10-CM | POA: Diagnosis not present

## 2014-12-15 DIAGNOSIS — I359 Nonrheumatic aortic valve disorder, unspecified: Secondary | ICD-10-CM

## 2014-12-15 DIAGNOSIS — Z952 Presence of prosthetic heart valve: Secondary | ICD-10-CM

## 2014-12-15 LAB — POCT INR: INR: 2.2

## 2015-01-10 ENCOUNTER — Ambulatory Visit (INDEPENDENT_AMBULATORY_CARE_PROVIDER_SITE_OTHER): Payer: Commercial Managed Care - PPO | Admitting: *Deleted

## 2015-01-10 DIAGNOSIS — Z954 Presence of other heart-valve replacement: Secondary | ICD-10-CM

## 2015-01-10 DIAGNOSIS — Z952 Presence of prosthetic heart valve: Secondary | ICD-10-CM

## 2015-01-10 DIAGNOSIS — Z5181 Encounter for therapeutic drug level monitoring: Secondary | ICD-10-CM

## 2015-01-10 DIAGNOSIS — Z7901 Long term (current) use of anticoagulants: Secondary | ICD-10-CM | POA: Diagnosis not present

## 2015-01-10 DIAGNOSIS — I359 Nonrheumatic aortic valve disorder, unspecified: Secondary | ICD-10-CM

## 2015-01-10 LAB — POCT INR: INR: 2.5

## 2015-01-30 ENCOUNTER — Encounter: Payer: Self-pay | Admitting: Cardiovascular Disease

## 2015-01-30 ENCOUNTER — Ambulatory Visit (INDEPENDENT_AMBULATORY_CARE_PROVIDER_SITE_OTHER): Payer: Commercial Managed Care - PPO | Admitting: *Deleted

## 2015-01-30 ENCOUNTER — Ambulatory Visit (INDEPENDENT_AMBULATORY_CARE_PROVIDER_SITE_OTHER): Payer: Commercial Managed Care - PPO | Admitting: Cardiovascular Disease

## 2015-01-30 VITALS — BP 126/82 | HR 77 | Ht 62.0 in | Wt 176.0 lb

## 2015-01-30 DIAGNOSIS — I359 Nonrheumatic aortic valve disorder, unspecified: Secondary | ICD-10-CM | POA: Diagnosis not present

## 2015-01-30 DIAGNOSIS — Z7901 Long term (current) use of anticoagulants: Secondary | ICD-10-CM | POA: Diagnosis not present

## 2015-01-30 DIAGNOSIS — Z5181 Encounter for therapeutic drug level monitoring: Secondary | ICD-10-CM

## 2015-01-30 DIAGNOSIS — Z954 Presence of other heart-valve replacement: Secondary | ICD-10-CM

## 2015-01-30 DIAGNOSIS — Z952 Presence of prosthetic heart valve: Secondary | ICD-10-CM

## 2015-01-30 LAB — POCT INR: INR: 2.5

## 2015-01-30 NOTE — Progress Notes (Signed)
Cardiology Office Note Date:  01/30/2015   ID:  [name], DOB 1956/01/27, MRN [id number]  PCP:  Amanda Dawson, MD  Cardiologist:  Amanda Mocha, MD    Chief Complaint  Patient presents with  . Aortic Stenosis    History of Present Illness: [name] is a 59 y.o. female who presents for follow-up of aortic valve disease.   The patient has a history of severe bicuspid valve aortic stenosis and she underwent mechanical aortic valve replacement in 2012 with a 19 mm mechanical bileaflet valve. Postoperatively she has been noted to have elevated transvalvular gradients. She's undergone transesophageal echo demonstrating no clear evidence of leaflet dysfunction. Mechanism has been felt to be related to her small aortic prosthesis.   The patient is doing well at present. She was hospitalized in March with hematuria. No clear source of bleeding was found. She was temporarily off of anticoagulation , but this was resumed without recurrence of bleeding. She was seen by cardiology during that hospitalization. She denies chest pain, edema, orthopnea, or PND. She has stable shortness of breath with high level activity, but no symptoms with her normal activities. She denies lightheadedness or syncope.  Past Medical History  Diagnosis Date  . Hyperlipidemia   . Multinodular thyroid   . GERD (gastroesophageal reflux disease)   . Hx of hysterectomy   . Palpitation   . Aortic stenosis   . Heart murmur     aortic stenosis - dr. Burt Marquez is her cardiologist  . Angina     chest tightness and pressure due to aortic stenosis  . Shortness of breath     associated with as  . Diabetes mellitus     newly diagnosed in july, 2012  . Headache(784.0)     migraines when younger  . PONV (postoperative nausea and vomiting)     Past Surgical History  Procedure Laterality Date  . Cesarean section    . Bunionectomy    . Partial hysterectomy    . Abdominal hysterectomy    . Aortic  valve replacement  06/25/2011    Procedure: AORTIC VALVE REPLACEMENT (AVR);  Surgeon: Amanda Aquas Adelene Idler, MD;  Location: [place];  Service: Open Heart Surgery;  Laterality: N/A;  . Cardiac valve replacement      Aortic Valve   06/25/2011  . Tee without cardioversion N/A 09/18/2012    Procedure: TRANSESOPHAGEAL ECHOCARDIOGRAM (TEE);  Surgeon: Amanda Records, MD;  Location: [facility] ENDOSCOPY;  Service: Cardiovascular;  Laterality: N/A;  . Cystoscopy with retrograde pyelogram, ureteroscopy and stent placement Left 10/21/2014    Procedure: CYSTOSCOPY WITH LEFT RETROGRADE PYELOGRAM, URETEROSCOPY BIOPSY AND STENT PLACEMENT;  Surgeon: Amanda Frock, MD;  Location: [facility];  Service: Urology;  Laterality: Left;    Current Outpatient Prescriptions  Medication Sig Dispense Refill  . aspirin 81 MG tablet Take 81 mg by mouth every morning.     Marland Kitchen CRESTOR 20 MG tablet TAKE 1 TABLET BY MOUTH ONCE A DAY 90 tablet 1  . metFORMIN (GLUCOPHAGE) 850 MG tablet Take 1 tablet (850 mg total) by mouth every evening. 30 tablet 0  . metoprolol tartrate (LOPRESSOR) 25 MG tablet Take 1 tablet (25 mg total) by mouth 2 (two) times daily. 180 tablet 2  . warfarin (COUMADIN) 5 MG tablet Take 1 tablet (5 mg total) by mouth as directed. 90 tablet 3  . [DISCONTINUED] metoprolol succinate (TOPROL-XL) 25 MG 24 hr tablet Take 1 tablet (25 mg total) by mouth at bedtime. [place]  tablet 11   No current facility-administered medications for this visit.    Allergies:   Atorvastatin; Codeine; Morphine and related; Niacin; and Percocet   Social History:  The patient  reports that she quit smoking about 20 years ago. Her smoking use included Cigarettes. She has a 15 pack-year smoking history. She has never used smokeless tobacco. She reports that she does not drink alcohol or use illicit drugs.   Family History:  The patient's  family history includes COPD in her father; Cancer (age of onset: 58) in her mother; Coronary artery disease in her father;  Diabetes in her mother; Hypertension in her other.    ROS:  Please see the history of present illness.  Otherwise, review of systems is positive for  Cough, blood and urine.  All other systems are reviewed and negative.    PHYSICAL EXAM: VS:  BP 126/82 mmHg  Pulse 77  Ht 5\' 2"  (1.575 m)  Wt 176 lb (79.833 kg)  BMI 32.18 kg/m2  SpO2 97% , BMI Body mass index is 32.18 kg/(m^2). GEN: Well nourished, well developed, in no acute distress HEENT: normal Neck: no JVD, no masses. No carotid bruits Cardiac: RRR with  Grade 2/6 systolic murmur at the right upper sternal                Respiratory:  clear to auscultation bilaterally, normal work of breathing GI: soft, nontender, nondistended, + BS MS: no deformity or atrophy Ext: no pretibial edema, pedal pulses 2+= bilaterally Skin: warm and dry, no rash Neuro:  Strength and sensation are intact Psych: euthymic mood, full affect  EKG:  EKG is not ordered today.  Recent Labs: 10/19/2014: ALT 15; Magnesium 1.8 10/22/2014: B Natriuretic Peptide 169.2* 10/26/2014: BUN 18; Creatinine, Ser 1.13*; Potassium 4.7; Sodium 139 10/28/2014: Hemoglobin 9.6*; Platelets 402*   Lipid Panel     Component Value Date/Time   CHOL 163 03/03/2013 0732   TRIG 238.0* 03/03/2013 0732   HDL 46.20 03/03/2013 0732   CHOLHDL 4 03/03/2013 0732   VLDL 47.6* 03/03/2013 0732   LDLCALC 91 03/18/2011 0743   LDLDIRECT 93.3 03/03/2013 0732      Wt Readings from Last 3 Encounters:  01/30/15 176 lb (79.833 kg)  10/28/14 175 lb 9.6 oz (79.652 kg)  01/26/14 179 lb 12 oz (81.534 kg)     Cardiac Studies Reviewed: 2D Echo 10/20/2014: Study Conclusions  - Left ventricle: The cavity size was normal. Wall thickness was increased in a pattern of mild LVH. Systolic function was normal. The estimated ejection fraction was in the range of 60% to 65%. Wall motion was normal; there were no regional wall motion abnormalities. Features are consistent with a pseudonormal  left ventricular filling pattern, with concomitant abnormal relaxation and increased filling pressure (grade 2 diastolic dysfunction). - Aortic valve: There was moderate stenosis. - Left atrium: The atrium was mildly dilated. - Right atrium: The atrium was mildly dilated. - Pulmonary arteries: Systolic pressure was moderately increased. PA peak pressure: 49 mm Hg (S).  ASSESSMENT AND PLAN: 1.   Aortic valve disease status post mechanical aortic valve replacement. The patient has had stable echo findings over the last several years, with most recent echo this spring. She has elevated gradients related to patient prosthesis mismatch. She follows SBE prophylaxis guidelines. She will continue on chronic anticoagulation. Fortunately, she's had no bleeding recurrence. I will see her back in one year with a repeat echo study at that time.   2. Hyperlipidemia: The patient  remains on Crestor. She is treated by her primary physician.   Current medicines are reviewed with the patient today.  The patient does not have concerns regarding medicines.  Labs/ tests ordered today include:  No orders of the defined types were placed in this encounter.    Disposition:   FU one year  Signed, Amanda Mocha, MD  01/30/2015 1:45 PM    [facility] [place], [place], [place]  [address] Phone: [phone]; Fax: [phone]

## 2015-01-30 NOTE — Patient Instructions (Signed)
Medication Instructions:  Your physician recommends that you continue on your current medications as directed. Please refer to the Current Medication list given to you today.  Labwork: No new orders.   Testing/Procedures: Your physician has requested that you have an echocardiogram in 1 YEAR. Echocardiography is a painless test that uses sound waves to create images of your heart. It provides your doctor with information about the size and shape of your heart and how well your heart's chambers and valves are working. This procedure takes approximately one hour. There are no restrictions for this procedure.  Follow-Up: Your physician wants you to follow-up in: 1 YEAR with Dr Cooper.  You will receive a reminder letter in the mail two months in advance. If you don't receive a letter, please call our office to schedule the follow-up appointment.   Any Other Special Instructions Will Be Listed Below (If Applicable).   

## 2015-02-06 ENCOUNTER — Other Ambulatory Visit: Payer: Self-pay

## 2015-03-15 ENCOUNTER — Ambulatory Visit (INDEPENDENT_AMBULATORY_CARE_PROVIDER_SITE_OTHER): Payer: Commercial Managed Care - PPO | Admitting: *Deleted

## 2015-03-15 DIAGNOSIS — Z5181 Encounter for therapeutic drug level monitoring: Secondary | ICD-10-CM | POA: Diagnosis not present

## 2015-03-15 DIAGNOSIS — I359 Nonrheumatic aortic valve disorder, unspecified: Secondary | ICD-10-CM | POA: Diagnosis not present

## 2015-03-15 DIAGNOSIS — Z7901 Long term (current) use of anticoagulants: Secondary | ICD-10-CM

## 2015-03-15 DIAGNOSIS — Z954 Presence of other heart-valve replacement: Secondary | ICD-10-CM | POA: Diagnosis not present

## 2015-03-15 DIAGNOSIS — Z952 Presence of prosthetic heart valve: Secondary | ICD-10-CM

## 2015-03-15 LAB — POCT INR: INR: 1.9

## 2015-03-28 ENCOUNTER — Other Ambulatory Visit: Payer: Self-pay

## 2015-03-28 DIAGNOSIS — Z1231 Encounter for screening mammogram for malignant neoplasm of breast: Secondary | ICD-10-CM

## 2015-04-26 ENCOUNTER — Ambulatory Visit (INDEPENDENT_AMBULATORY_CARE_PROVIDER_SITE_OTHER): Payer: Commercial Managed Care - PPO | Admitting: *Deleted

## 2015-04-26 DIAGNOSIS — Z5181 Encounter for therapeutic drug level monitoring: Secondary | ICD-10-CM

## 2015-04-26 DIAGNOSIS — I359 Nonrheumatic aortic valve disorder, unspecified: Secondary | ICD-10-CM | POA: Diagnosis not present

## 2015-04-26 DIAGNOSIS — Z7901 Long term (current) use of anticoagulants: Secondary | ICD-10-CM

## 2015-04-26 DIAGNOSIS — Z954 Presence of other heart-valve replacement: Secondary | ICD-10-CM | POA: Diagnosis not present

## 2015-04-26 DIAGNOSIS — Z952 Presence of prosthetic heart valve: Secondary | ICD-10-CM

## 2015-04-26 LAB — POCT INR: INR: 2.8

## 2015-05-16 ENCOUNTER — Ambulatory Visit
Admission: RE | Admit: 2015-05-16 | Discharge: 2015-05-16 | Disposition: A | Discharge status: Home / Self Care | Payer: Commercial Managed Care - PPO | Source: Ambulatory Visit

## 2015-05-16 DIAGNOSIS — Z1231 Encounter for screening mammogram for malignant neoplasm of breast: Secondary | ICD-10-CM

## 2015-06-05 ENCOUNTER — Ambulatory Visit (INDEPENDENT_AMBULATORY_CARE_PROVIDER_SITE_OTHER): Payer: Commercial Managed Care - PPO | Admitting: *Deleted

## 2015-06-05 DIAGNOSIS — Z954 Presence of other heart-valve replacement: Secondary | ICD-10-CM | POA: Diagnosis not present

## 2015-06-05 DIAGNOSIS — Z7901 Long term (current) use of anticoagulants: Secondary | ICD-10-CM

## 2015-06-05 DIAGNOSIS — Z5181 Encounter for therapeutic drug level monitoring: Secondary | ICD-10-CM | POA: Diagnosis not present

## 2015-06-05 DIAGNOSIS — Z952 Presence of prosthetic heart valve: Secondary | ICD-10-CM

## 2015-06-05 DIAGNOSIS — I359 Nonrheumatic aortic valve disorder, unspecified: Secondary | ICD-10-CM | POA: Diagnosis not present

## 2015-06-05 LAB — POCT INR: INR: 2.2

## 2015-07-17 ENCOUNTER — Ambulatory Visit (INDEPENDENT_AMBULATORY_CARE_PROVIDER_SITE_OTHER): Payer: Commercial Managed Care - PPO | Admitting: *Deleted

## 2015-07-17 DIAGNOSIS — I359 Nonrheumatic aortic valve disorder, unspecified: Secondary | ICD-10-CM

## 2015-07-17 DIAGNOSIS — Z7901 Long term (current) use of anticoagulants: Secondary | ICD-10-CM | POA: Diagnosis not present

## 2015-07-17 DIAGNOSIS — Z954 Presence of other heart-valve replacement: Secondary | ICD-10-CM | POA: Diagnosis not present

## 2015-07-17 DIAGNOSIS — Z5181 Encounter for therapeutic drug level monitoring: Secondary | ICD-10-CM | POA: Diagnosis not present

## 2015-07-17 DIAGNOSIS — Z952 Presence of prosthetic heart valve: Secondary | ICD-10-CM

## 2015-07-17 LAB — POCT INR: INR: 1.7

## 2015-07-25 ENCOUNTER — Other Ambulatory Visit: Payer: Self-pay | Admitting: Cardiovascular Disease

## 2015-07-28 ENCOUNTER — Ambulatory Visit (INDEPENDENT_AMBULATORY_CARE_PROVIDER_SITE_OTHER): Payer: Commercial Managed Care - PPO | Admitting: *Deleted

## 2015-07-28 DIAGNOSIS — Z7901 Long term (current) use of anticoagulants: Secondary | ICD-10-CM | POA: Diagnosis not present

## 2015-07-28 DIAGNOSIS — Z954 Presence of other heart-valve replacement: Secondary | ICD-10-CM | POA: Diagnosis not present

## 2015-07-28 DIAGNOSIS — I359 Nonrheumatic aortic valve disorder, unspecified: Secondary | ICD-10-CM

## 2015-07-28 DIAGNOSIS — Z5181 Encounter for therapeutic drug level monitoring: Secondary | ICD-10-CM | POA: Diagnosis not present

## 2015-07-28 DIAGNOSIS — Z952 Presence of prosthetic heart valve: Secondary | ICD-10-CM

## 2015-07-28 LAB — POCT INR: INR: 2.1

## 2015-08-25 ENCOUNTER — Ambulatory Visit (INDEPENDENT_AMBULATORY_CARE_PROVIDER_SITE_OTHER): Payer: Commercial Managed Care - PPO | Admitting: *Deleted

## 2015-08-25 DIAGNOSIS — I359 Nonrheumatic aortic valve disorder, unspecified: Secondary | ICD-10-CM

## 2015-08-25 DIAGNOSIS — Z5181 Encounter for therapeutic drug level monitoring: Secondary | ICD-10-CM

## 2015-08-25 DIAGNOSIS — Z952 Presence of prosthetic heart valve: Secondary | ICD-10-CM

## 2015-08-25 DIAGNOSIS — Z954 Presence of other heart-valve replacement: Secondary | ICD-10-CM | POA: Diagnosis not present

## 2015-08-25 DIAGNOSIS — Z7901 Long term (current) use of anticoagulants: Secondary | ICD-10-CM | POA: Diagnosis not present

## 2015-08-25 LAB — POCT INR: INR: 2.4

## 2015-09-22 ENCOUNTER — Ambulatory Visit (INDEPENDENT_AMBULATORY_CARE_PROVIDER_SITE_OTHER): Payer: Commercial Managed Care - PPO | Admitting: *Deleted

## 2015-09-22 DIAGNOSIS — I359 Nonrheumatic aortic valve disorder, unspecified: Secondary | ICD-10-CM | POA: Diagnosis not present

## 2015-09-22 DIAGNOSIS — Z954 Presence of other heart-valve replacement: Secondary | ICD-10-CM | POA: Diagnosis not present

## 2015-09-22 DIAGNOSIS — Z5181 Encounter for therapeutic drug level monitoring: Secondary | ICD-10-CM | POA: Diagnosis not present

## 2015-09-22 DIAGNOSIS — Z7901 Long term (current) use of anticoagulants: Secondary | ICD-10-CM | POA: Diagnosis not present

## 2015-09-22 DIAGNOSIS — Z952 Presence of prosthetic heart valve: Secondary | ICD-10-CM

## 2015-09-22 LAB — POCT INR: INR: 2.7

## 2015-10-16 ENCOUNTER — Encounter: Payer: Self-pay | Admitting: Cardiovascular Disease

## 2015-10-17 ENCOUNTER — Other Ambulatory Visit: Payer: Self-pay

## 2015-10-17 DIAGNOSIS — I359 Nonrheumatic aortic valve disorder, unspecified: Secondary | ICD-10-CM

## 2015-10-24 ENCOUNTER — Other Ambulatory Visit: Payer: Self-pay | Admitting: Cardiovascular Disease

## 2015-10-31 ENCOUNTER — Other Ambulatory Visit: Payer: Self-pay

## 2015-10-31 ENCOUNTER — Ambulatory Visit (HOSPITAL_COMMUNITY): Payer: Commercial Managed Care - PPO | Attending: Cardiovascular Disease

## 2015-10-31 DIAGNOSIS — I359 Nonrheumatic aortic valve disorder, unspecified: Secondary | ICD-10-CM

## 2015-10-31 DIAGNOSIS — E785 Hyperlipidemia, unspecified: Secondary | ICD-10-CM | POA: Insufficient documentation

## 2015-10-31 DIAGNOSIS — E119 Type 2 diabetes mellitus without complications: Secondary | ICD-10-CM | POA: Insufficient documentation

## 2015-11-03 ENCOUNTER — Ambulatory Visit (INDEPENDENT_AMBULATORY_CARE_PROVIDER_SITE_OTHER): Payer: Commercial Managed Care - PPO | Admitting: *Deleted

## 2015-11-03 ENCOUNTER — Encounter: Payer: Self-pay | Admitting: Cardiovascular Disease

## 2015-11-03 ENCOUNTER — Ambulatory Visit (INDEPENDENT_AMBULATORY_CARE_PROVIDER_SITE_OTHER): Payer: Commercial Managed Care - PPO | Admitting: Cardiovascular Disease

## 2015-11-03 VITALS — BP 124/80 | HR 72 | Ht 62.0 in | Wt 181.8 lb

## 2015-11-03 DIAGNOSIS — Z7901 Long term (current) use of anticoagulants: Secondary | ICD-10-CM | POA: Diagnosis not present

## 2015-11-03 DIAGNOSIS — Z952 Presence of prosthetic heart valve: Secondary | ICD-10-CM

## 2015-11-03 DIAGNOSIS — I350 Nonrheumatic aortic (valve) stenosis: Secondary | ICD-10-CM | POA: Diagnosis not present

## 2015-11-03 DIAGNOSIS — Z954 Presence of other heart-valve replacement: Secondary | ICD-10-CM

## 2015-11-03 DIAGNOSIS — Z5181 Encounter for therapeutic drug level monitoring: Secondary | ICD-10-CM

## 2015-11-03 DIAGNOSIS — I359 Nonrheumatic aortic valve disorder, unspecified: Secondary | ICD-10-CM | POA: Diagnosis not present

## 2015-11-03 LAB — POCT INR: INR: 2

## 2015-11-03 NOTE — Patient Instructions (Signed)

## 2015-11-03 NOTE — Progress Notes (Signed)
Cardiology Office Note Date:  11/03/2015   ID:  [name], DOB 27-Jul-1956, MRN [id number]  PCP:  Lottie Dawson, MD  Cardiologist:  Sherren Mocha, MD    Chief Complaint  Patient presents with  . aortic valve disorders    c/o mild chest pressure and sob with activity. denies any le edema or claudcation     History of Present Illness: [name] is a 60 y.o. female who presents for follow-up of aortic valve disease.   The patient has a history of severe bicuspid valve aortic stenosis and she underwent mechanical aortic valve replacement in 2012 with a 19 mm mechanical bileaflet valve. Postoperatively she has been noted to have elevated transvalvular gradients. She's undergone transesophageal echo demonstrating no clear evidence of leaflet dysfunction. Mechanism has been felt to be related to her small aortic prosthesis.   The patient recently called in because she was concerned about recurrent symptoms of chest pressure and dyspnea with exertion. States symptoms are mild but remind her of symptoms that predated her surgery. No resting chest pain or dyspnea. No edema, orthopnea, or PND. Also has lightheadedness with activity when she 'pushes it.' No syncope.    Past Medical History  Diagnosis Date  . Hyperlipidemia   . Multinodular thyroid   . GERD (gastroesophageal reflux disease)   . Hx of hysterectomy   . Palpitation   . Aortic stenosis   . Heart murmur     aortic stenosis - dr. Burt Knack is her cardiologist  . Angina     chest tightness and pressure due to aortic stenosis  . Shortness of breath     associated with as  . Diabetes mellitus     newly diagnosed in july, 2012  . Headache(784.0)     migraines when younger  . PONV (postoperative nausea and vomiting)     Past Surgical History  Procedure Laterality Date  . Cesarean section    . Bunionectomy    . Partial hysterectomy    . Abdominal hysterectomy    . Aortic valve replacement  06/25/2011      Procedure: AORTIC VALVE REPLACEMENT (AVR);  Surgeon: Tharon Aquas Adelene Idler, MD;  Location: [place];  Service: Open Heart Surgery;  Laterality: N/A;  . Cardiac valve replacement      Aortic Valve   06/25/2011  . Tee without cardioversion N/A 09/18/2012    Procedure: TRANSESOPHAGEAL ECHOCARDIOGRAM (TEE);  Surgeon: Fay Records, MD;  Location: [facility] ENDOSCOPY;  Service: Cardiovascular;  Laterality: N/A;  . Cystoscopy with retrograde pyelogram, ureteroscopy and stent placement Left 10/21/2014    Procedure: CYSTOSCOPY WITH LEFT RETROGRADE PYELOGRAM, URETEROSCOPY BIOPSY AND STENT PLACEMENT;  Surgeon: Alexis Frock, MD;  Location: [facility];  Service: Urology;  Laterality: Left;    Current Outpatient Prescriptions  Medication Sig Dispense Refill  . aspirin 81 MG tablet Take 81 mg by mouth every morning.     Marland Kitchen CRESTOR 20 MG tablet TAKE 1 TABLET BY MOUTH ONCE A DAY 90 tablet 1  . metFORMIN (GLUMETZA) 1000 MG (MOD) 24 hr tablet Take 1,000 mg by mouth 2 (two) times daily with a meal.    . metoprolol tartrate (LOPRESSOR) 25 MG tablet Take 25 mg by mouth 2 (two) times daily.    Marland Kitchen warfarin (COUMADIN) 5 MG tablet Take as directed by the Coumadin clinic 80 tablet 1  . [DISCONTINUED] metoprolol succinate (TOPROL-XL) 25 MG 24 hr tablet Take 1 tablet (25 mg total) by mouth at bedtime. 30 tablet  11   No current facility-administered medications for this visit.    Allergies:   Atorvastatin; Codeine; Morphine and related; Niacin; and Percocet   Social History:  The patient  reports that she quit smoking about 21 years ago. Her smoking use included Cigarettes. She has a 15 pack-year smoking history. She has never used smokeless tobacco. She reports that she does not drink alcohol or use illicit drugs.   Family History:  The patient's  family history includes COPD in her father; Cancer (age of onset: 2) in her mother; Coronary artery disease in her father; Diabetes in her mother; Hypertension in her other.    ROS:   Please see the history of present illness. All other systems are reviewed and negative.    PHYSICAL EXAM: VS:  BP 124/80 mmHg  Pulse 72  Ht 5\' 2"  (1.575 m)  Wt 181 lb 12.8 oz (82.464 kg)  BMI 33.24 kg/m2 , BMI Body mass index is 33.24 kg/(m^2). GEN: Well nourished, well developed, in no acute distress HEENT: normal Neck: no JVD, no masses.  Cardiac: RRR with 2/6 systolic murmur at the RUSB, no diastolic murmur            Respiratory:  clear to auscultation bilaterally, normal work of breathing GI: soft, nontender, nondistended, + BS MS: no deformity or atrophy Ext: no pretibial edema, pedal pulses 2+= bilaterally Skin: warm and dry, no rash Neuro:  Strength and sensation are intact Psych: euthymic mood, full affect  EKG:  EKG is ordered today. The ekg ordered today shows NSR 70 bpm, cannot rule out inferior MI age-undetermined, prolonged QT  Recent Labs: No results found for requested labs within last 365 days.   Lipid Panel     Component Value Date/Time   CHOL 163 03/03/2013 0732   TRIG 238.0* 03/03/2013 0732   HDL 46.20 03/03/2013 0732   CHOLHDL 4 03/03/2013 0732   VLDL 47.6* 03/03/2013 0732   LDLCALC 91 03/18/2011 0743   LDLDIRECT 93.3 03/03/2013 0732      Wt Readings from Last 3 Encounters:  11/03/15 181 lb 12.8 oz (82.464 kg)  01/30/15 176 lb (79.833 kg)  10/28/14 175 lb 9.6 oz (79.652 kg)     Cardiac Studies Reviewed: 2D Echo: Left ventricle: The cavity size was normal. Wall thickness was normal. Systolic function was vigorous. The estimated ejection fraction was in the range of 65% to 70%. Wall motion was normal; there were no regional wall motion abnormalities. Doppler parameters are consistent with abnormal left ventricular relaxation (grade 1 diastolic dysfunction).  ------------------------------------------------------------------- Aortic valve: The valve is not seen well. The patient has a hx of mechanical aortic valve replacement . There is a  mean aortic valve gradient of 33 mm hg. This is unchanged from previous echo reports . i was not able to pull up old echo images. A mechanical prosthesis was present. Doppler: VTI ratio of LVOT to aortic valve: 0.39. Valve area (VTI): 0.98 cm^2. Indexed valve area (VTI): 0.52 cm^2/m^2. Peak velocity ratio of LVOT to aortic valve: 0.35. Valve area (Vmax): 0.88 cm^2. Indexed valve area (Vmax): 0.46 cm^2/m^2. Mean velocity ratio of LVOT to aortic valve: 0.34. Valve area (Vmean): 0.88 cm^2. Indexed valve area (Vmean): 0.46 cm^2/m^2. Mean gradient (S): 33 mm Hg. Peak gradient (S): 62 mm Hg.  ------------------------------------------------------------------- Aorta: Aortic root: The aortic root was normal in size. Ascending aorta: The ascending aorta was normal in size.  ------------------------------------------------------------------- Mitral valve: Structurally normal valve. Leaflet separation was normal. Doppler: Transvalvular velocity was  within the normal range. There was no evidence for stenosis. There was no regurgitation. Peak gradient (D): 4 mm Hg.  ------------------------------------------------------------------- Left atrium: The atrium was normal in size.  ------------------------------------------------------------------- Right ventricle: The cavity size was normal. Systolic function was normal.  ------------------------------------------------------------------- Pulmonic valve: Structurally normal valve. Cusp separation was normal. Doppler: Transvalvular velocity was within the normal range. There was no regurgitation.  ------------------------------------------------------------------- Tricuspid valve: Structurally normal valve. Leaflet separation was normal. Doppler: Transvalvular velocity was within the normal range. There was trivial regurgitation.  ------------------------------------------------------------------- Right  atrium: The atrium was normal in size.  ------------------------------------------------------------------- Pericardium: There was no pericardial effusion.  ASSESSMENT AND PLAN: 1.  Aortic valve disease: s/p mechanical AVR. Pt with NYHA II symptoms. Elevated gradients are stable over time and likely related to patient-prosthesis mismatch with a 19 mm mechanical valve. Advised on avoidance of strenuous exercise and extreme temperatures, but she understands the importance of diet and light exercise.  2. Hyperlipidemia: treated with crestor  3. Chronic oral anticoagulation: tolerating ASA and warfarin without bleeding problems  4. Type II DM: treated with metformin. Managed by her PCP.   Current medicines are reviewed with the patient today.  The patient does not have concerns regarding medicines.  Labs/ tests ordered today include:  No orders of the defined types were placed in this encounter.   Disposition:   FU 6 months, will plan to continue 6 month exams and yearly echo studies  Signed, Sherren Mocha, MD  11/03/2015 10:43 AM    [facility] [place], [place], [place]  [address] Phone: [phone]; Fax: [phone]

## 2015-11-09 NOTE — Addendum Note (Signed)
Addended by: Freada Bergeron on: 11/09/2015 05:53 PM   Modules accepted: Orders

## 2015-12-15 ENCOUNTER — Ambulatory Visit (INDEPENDENT_AMBULATORY_CARE_PROVIDER_SITE_OTHER): Payer: Commercial Managed Care - PPO | Admitting: *Deleted

## 2015-12-15 DIAGNOSIS — Z954 Presence of other heart-valve replacement: Secondary | ICD-10-CM

## 2015-12-15 DIAGNOSIS — Z7901 Long term (current) use of anticoagulants: Secondary | ICD-10-CM | POA: Diagnosis not present

## 2015-12-15 DIAGNOSIS — Z5181 Encounter for therapeutic drug level monitoring: Secondary | ICD-10-CM | POA: Diagnosis not present

## 2015-12-15 DIAGNOSIS — I359 Nonrheumatic aortic valve disorder, unspecified: Secondary | ICD-10-CM

## 2015-12-15 DIAGNOSIS — Z952 Presence of prosthetic heart valve: Secondary | ICD-10-CM

## 2015-12-15 LAB — POCT INR: INR: 1.8

## 2016-01-05 ENCOUNTER — Ambulatory Visit (INDEPENDENT_AMBULATORY_CARE_PROVIDER_SITE_OTHER): Payer: Commercial Managed Care - PPO | Admitting: *Deleted

## 2016-01-05 DIAGNOSIS — I359 Nonrheumatic aortic valve disorder, unspecified: Secondary | ICD-10-CM | POA: Diagnosis not present

## 2016-01-05 DIAGNOSIS — Z5181 Encounter for therapeutic drug level monitoring: Secondary | ICD-10-CM | POA: Diagnosis not present

## 2016-01-05 DIAGNOSIS — Z7901 Long term (current) use of anticoagulants: Secondary | ICD-10-CM

## 2016-01-05 DIAGNOSIS — Z954 Presence of other heart-valve replacement: Secondary | ICD-10-CM

## 2016-01-05 DIAGNOSIS — Z952 Presence of prosthetic heart valve: Secondary | ICD-10-CM

## 2016-01-05 LAB — POCT INR: INR: 2.4

## 2016-02-01 ENCOUNTER — Ambulatory Visit (INDEPENDENT_AMBULATORY_CARE_PROVIDER_SITE_OTHER): Payer: Commercial Managed Care - PPO | Admitting: *Deleted

## 2016-02-01 DIAGNOSIS — Z954 Presence of other heart-valve replacement: Secondary | ICD-10-CM | POA: Diagnosis not present

## 2016-02-01 DIAGNOSIS — Z7901 Long term (current) use of anticoagulants: Secondary | ICD-10-CM | POA: Diagnosis not present

## 2016-02-01 DIAGNOSIS — Z5181 Encounter for therapeutic drug level monitoring: Secondary | ICD-10-CM | POA: Diagnosis not present

## 2016-02-01 DIAGNOSIS — Z952 Presence of prosthetic heart valve: Secondary | ICD-10-CM

## 2016-02-01 DIAGNOSIS — I359 Nonrheumatic aortic valve disorder, unspecified: Secondary | ICD-10-CM

## 2016-02-01 LAB — POCT INR: INR: 2.1

## 2016-02-26 ENCOUNTER — Encounter: Payer: Self-pay | Admitting: Cardiovascular Disease

## 2016-02-29 ENCOUNTER — Ambulatory Visit (INDEPENDENT_AMBULATORY_CARE_PROVIDER_SITE_OTHER): Payer: Commercial Managed Care - PPO | Admitting: *Deleted

## 2016-02-29 DIAGNOSIS — Z952 Presence of prosthetic heart valve: Secondary | ICD-10-CM

## 2016-02-29 DIAGNOSIS — I359 Nonrheumatic aortic valve disorder, unspecified: Secondary | ICD-10-CM

## 2016-02-29 DIAGNOSIS — Z954 Presence of other heart-valve replacement: Secondary | ICD-10-CM

## 2016-02-29 DIAGNOSIS — Z5181 Encounter for therapeutic drug level monitoring: Secondary | ICD-10-CM

## 2016-02-29 DIAGNOSIS — Z7901 Long term (current) use of anticoagulants: Secondary | ICD-10-CM

## 2016-02-29 LAB — POCT INR: INR: 2

## 2016-03-05 IMAGING — CT CT ABD-PEL WO/W CM
2 of 5 series · 11 of 32 positions shown, 17 images · IV contrast (OMNIPAQUE 300)
Comparison: CT of the abdomen and pelvis 10/16/2014.

CLINICAL DATA: 59-year-old female with recent history of urinary
tract infection, recently passing blood clots in her urine.
Abdominal pain since yesterday. Severe left flank pain since this
morning. Unable to urinate.

EXAM:
CT ABDOMEN AND PELVIS WITHOUT AND WITH CONTRAST
TECHNIQUE: Multidetector CT imaging of the abdomen and pelvis was performed
following the standard protocol before and following the bolus
administration of intravenous contrast.
CONTRAST:  100mL OMNIPAQUE IOHEXOL 300 MG/ML  SOLN

[Series 2: abd/pel w/o · axial · non-contrast · 0.74mm/px · z∈[-219,+156]mm · 9 of 95 slices shown, 15 images]
[im 10/95  soft-tissue]
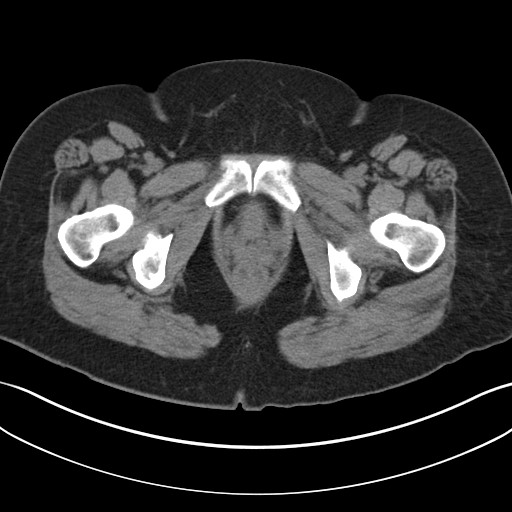
[im 10/95  bone]
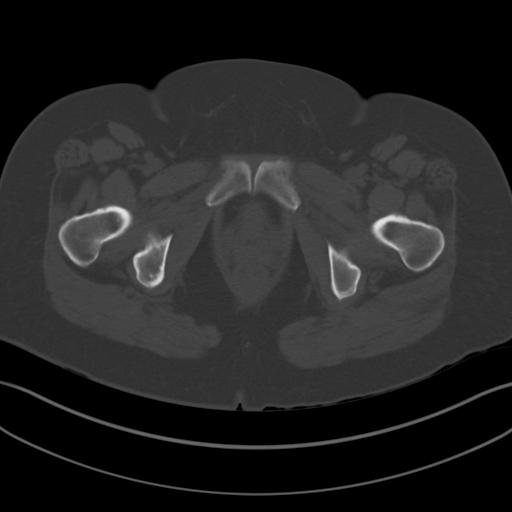
[im 19/95  soft-tissue]
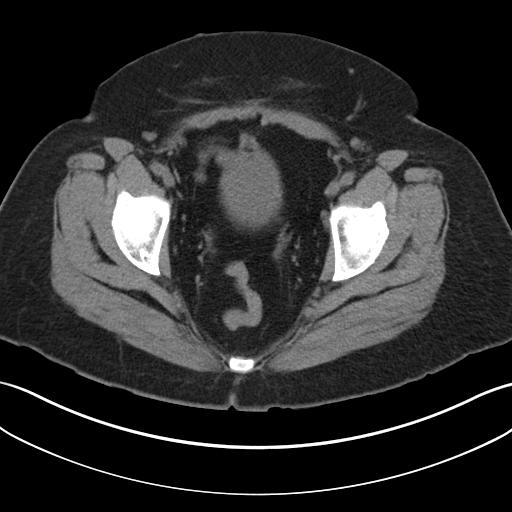
[im 29/95  soft-tissue]
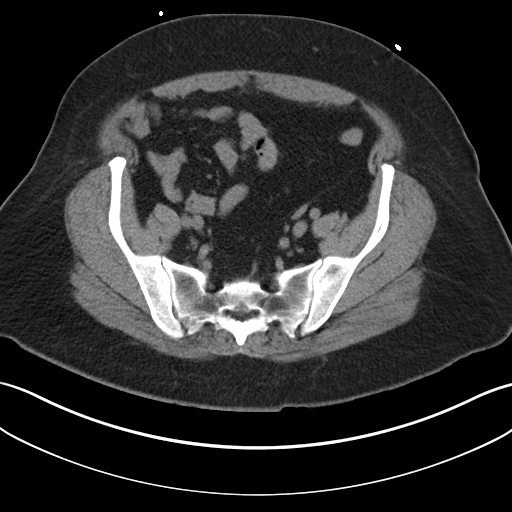
[im 38/95  soft-tissue]
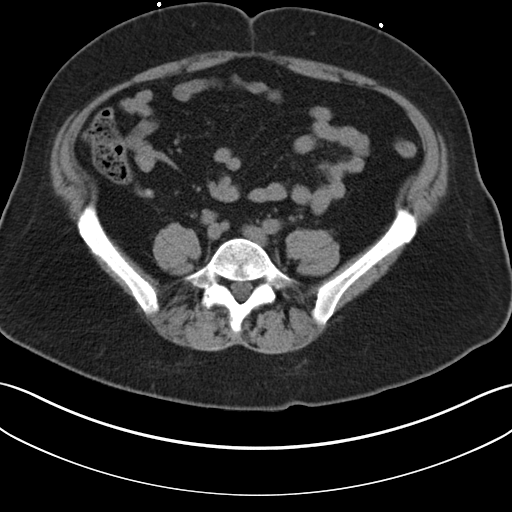
[im 48/95  soft-tissue]
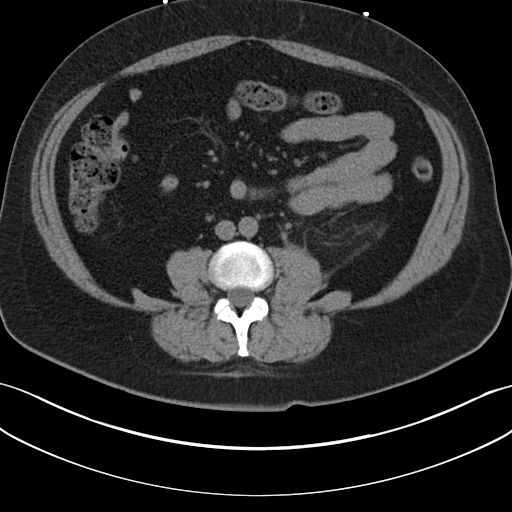
[im 57/95  soft-tissue]
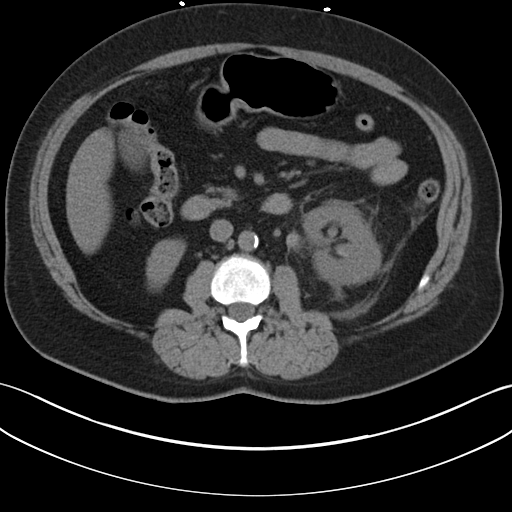
[im 57/95  lung]
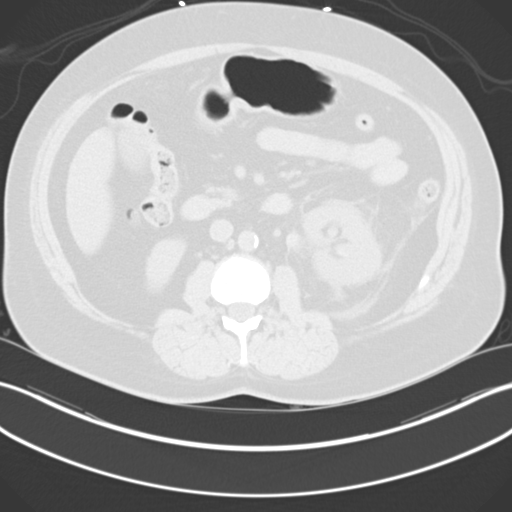
[im 66/95  soft-tissue]
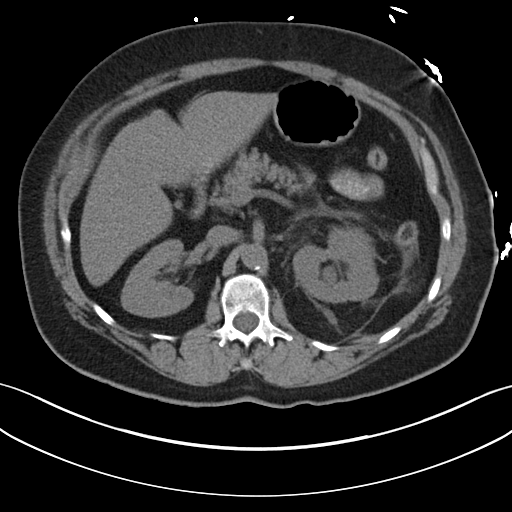
[im 66/95  lung]
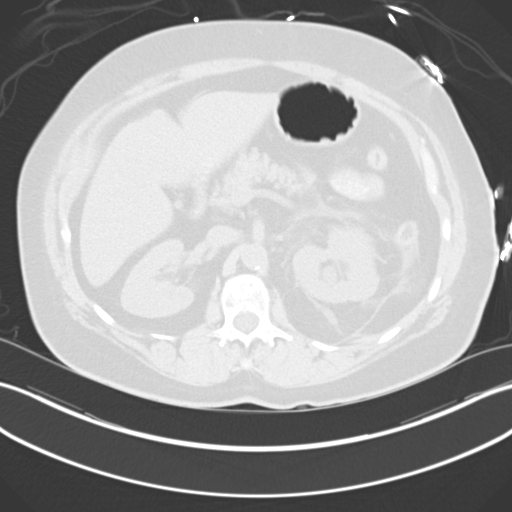
[im 76/95  soft-tissue]
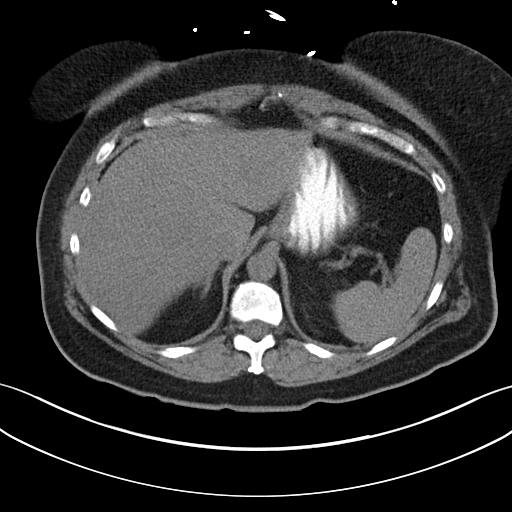
[im 76/95  lung]
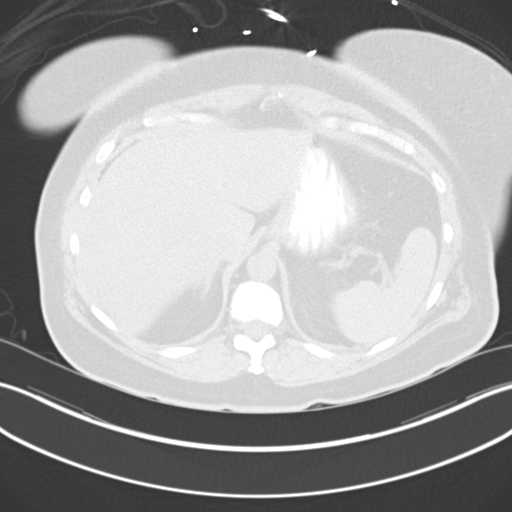
[im 85/95  soft-tissue]
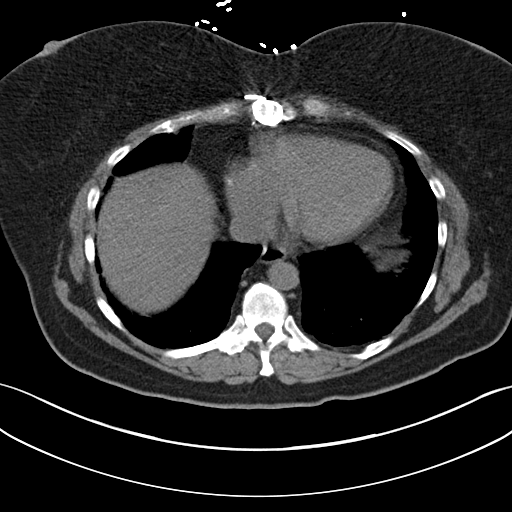
[im 85/95  lung]
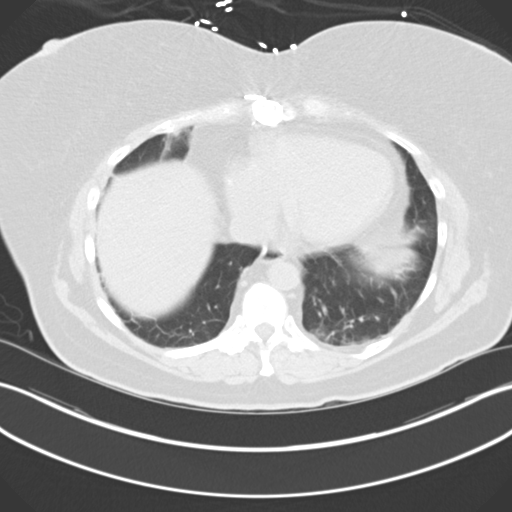
[im 85/95  bone]
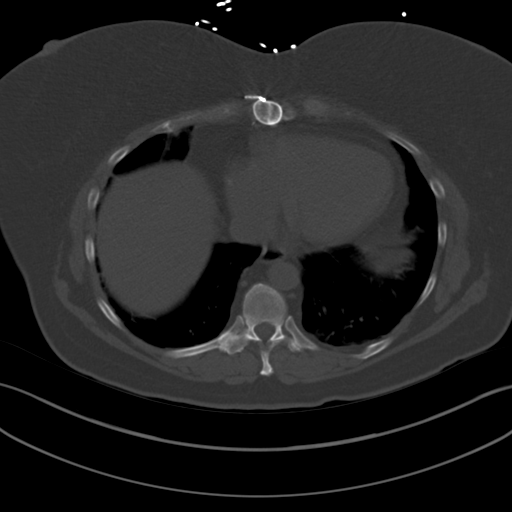

[Series 6: abd/pel with · axial · 0.74mm/px · z∈[-219,-174]mm · 2 of 95 slices shown]
[im 10/95  soft-tissue]
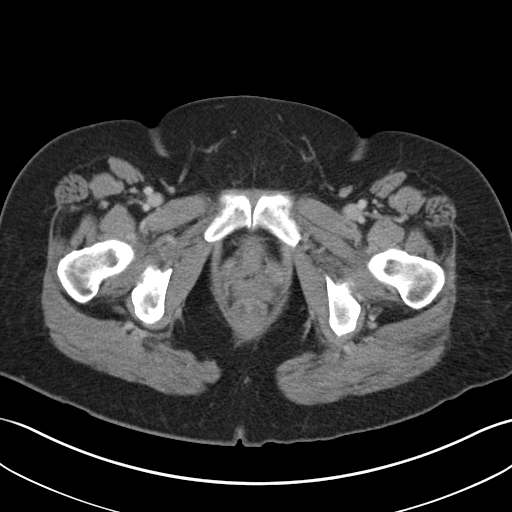
[im 19/95  soft-tissue]
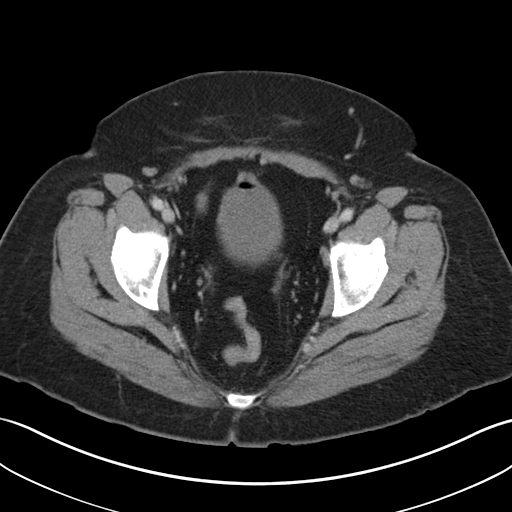

[11 of 32 positions shown; findings below may reference images not displayed]

FINDINGS: Lower chest: Mild dependent atelectasis in the lower lobes of the
lungs bilaterally (left greater than right). Mechanical aortic
valve. Status post median sternotomy.

Hepatobiliary: Mild diffuse hepatic steatosis, with area of more
significant focal fatty infiltration in the central aspect of
segment 4B of the liver, and adjacent to the falciform ligament
anteriorly in segment 4B, similar to the prior examination. There is
an additional hypovascular lesion measuring 1 cm in the periphery of
segment 6 (image 30 of series 6), which is unchanged in size
compared to prior study 04/15/2012, and although technically
indeterminate, considered benign. No other suspicious appearing
hepatic lesions are noted. No intra or extrahepatic biliary ductal
dilatation. Gallbladder is normal in appearance.

Pancreas: Unremarkable.

Spleen: Unremarkable.

Adrenals/Urinary Tract: No calcifications are identified within the
collecting system of either kidney, along the course of either
ureter, or within the lumen of the urinary bladder. Right kidney is
normal in appearance. New moderate left-sided hydronephrosis.
Notably, the left renal pelvis, most proximal aspect of the left
ureter, and the lower pole collecting system of the left kidney
contain high attenuation material (45 Hounsfield units), favored to
represent blood. Extensive left-sided perinephric stranding.
Generalized hypoperfusion throughout the renal parenchyma of the
left kidney as compared to the right, without frank striated
nephrogram. Delayed images demonstrate no significant excretion of
contrast material by the left kidney. Normal right-sided excretion
is noted. Urinary bladder is generally normal in appearance, with
exception of a nondependent locule of gas, presumably iatrogenic
related to recent catheterization.

Stomach/Bowel: Normal appearance of the stomach. No pathologic
dilatation of small bowel or colon. Normal appendix.

Vascular/Lymphatic: Atherosclerosis throughout the abdominal and
pelvic vasculature, without evidence of aneurysm or dissection. Left
renal artery and vein are well visualized and are both widely
patent. Numerous nonenlarged retroperitoneal lymph nodes are noted,
likely reactive. No pathologically enlarged lymph nodes identified
in the abdomen or pelvis.

Reproductive: Status post hysterectomy.  Ovaries are atrophic.

Other: No significant volume of ascites.  No pneumoperitoneum.

Musculoskeletal: There are no aggressive appearing lytic or blastic
lesions noted in the visualized portions of the skeleton.
IMPRESSION: 1. Interval development of moderate left-sided hydronephrosis and
extensive left-sided perinephric stranding, which is likely related
to obstructing clot in the left renal collecting system and proximal
left ureter. There is generalized hypoperfusion of the left kidney,
which is favored to be related to this obstruction. No frank
striated nephrogram is identified at this time to strongly suggest
pyelonephritis, although correlation with urinalysis is recommended.
2. No urinary tract calculi.
3. Normal appendix.
4. Additional incidental findings, as above.
These results were called by telephone at the time of interpretation
on 10/19/2014 at [DATE] to Dr. JEVAN TIGER, who verbally
acknowledged these results.

## 2016-03-05 NOTE — Progress Notes (Signed)
This encounter was created in error - please disregard.

## 2016-03-07 IMAGING — DX DG CHEST 1V PORT SAME DAY
1 series · 1 of 1 positions shown · non-contrast
Comparison: Film from earlier in the same day

CLINICAL DATA: Reassess CHF

EXAM:
PORTABLE CHEST - 1 VIEW SAME DAY

[chest ap]
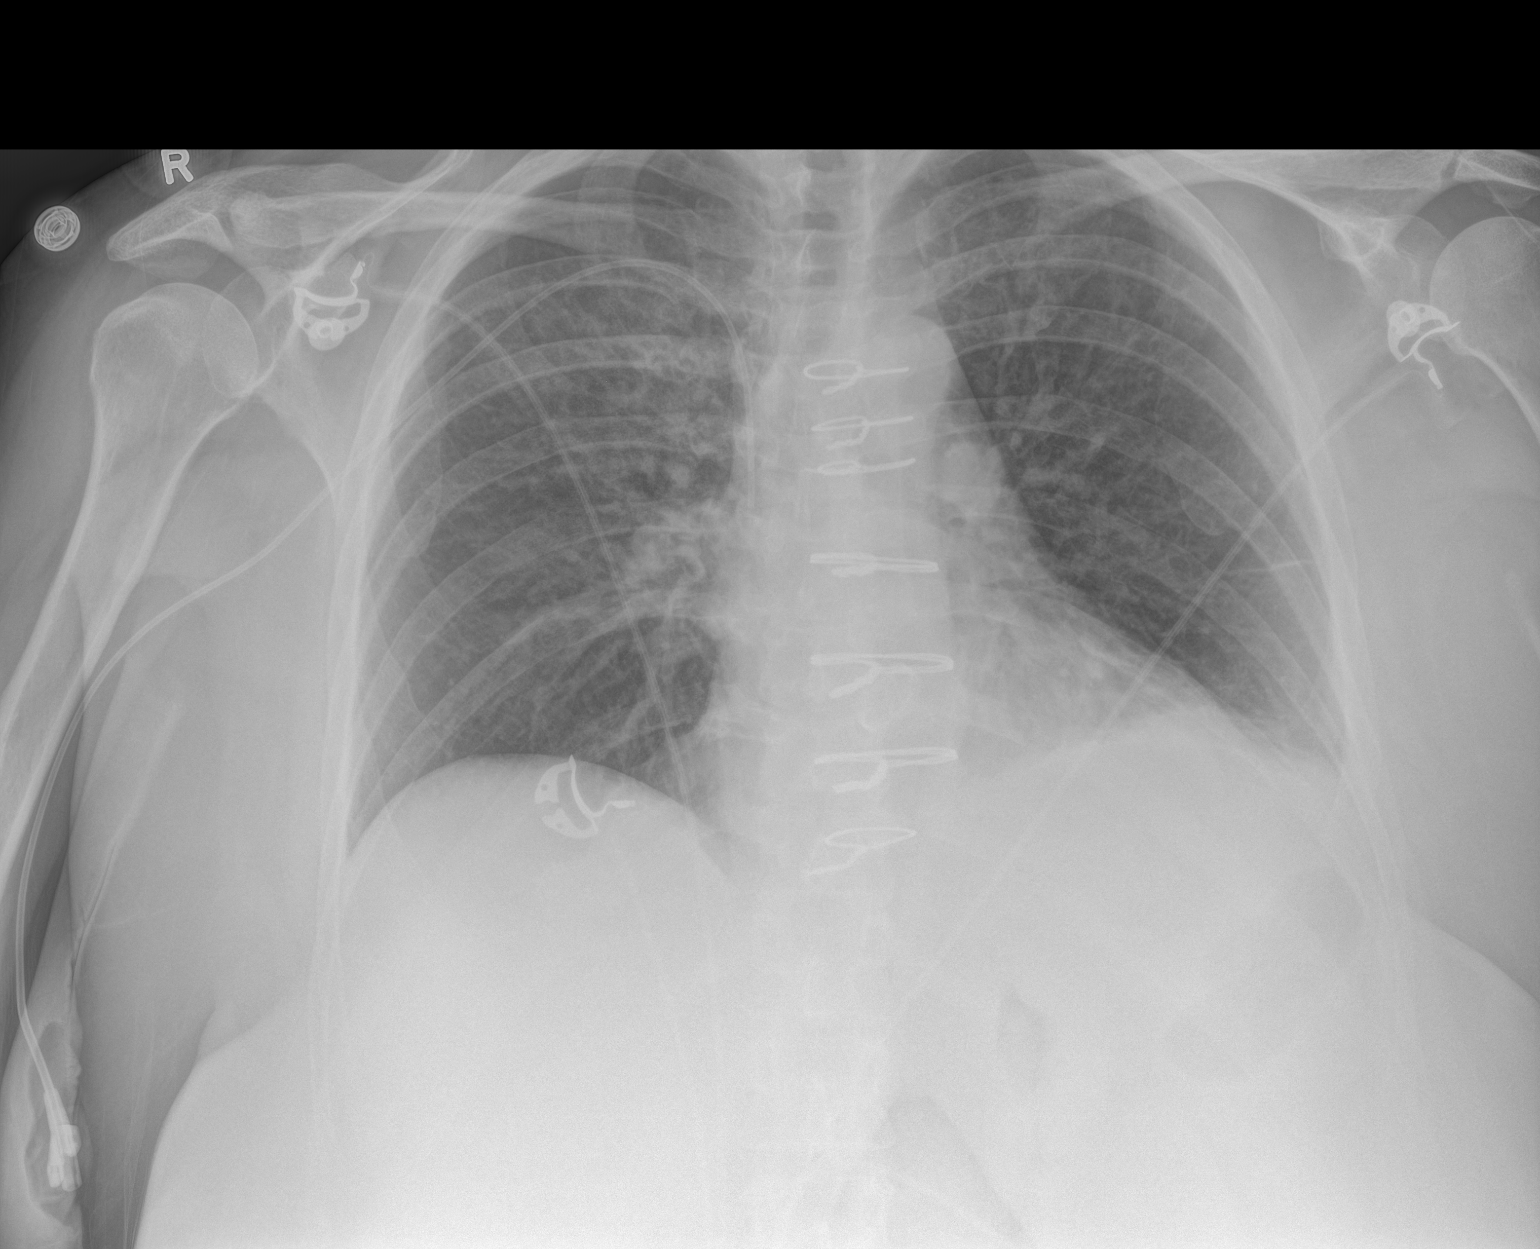

[1 of 1 positions shown; findings below may reference images not displayed]

FINDINGS: Cardiac shadow is stable. Postoperative changes are again seen. A
right-sided PICC line is now noted with the catheter tip in the mid
superior vena cava in satisfactory position. Mild left basilar
atelectasis is noted. The degree of vascular congestion has improved
significantly in the interval from the prior exam. Mild residual
changes are noted.
IMPRESSION: Significant improvement in CHF. Minimal residual atelectasis is
noted.

## 2016-03-27 ENCOUNTER — Other Ambulatory Visit: Payer: Self-pay | Admitting: Cardiovascular Disease

## 2016-04-11 ENCOUNTER — Ambulatory Visit (INDEPENDENT_AMBULATORY_CARE_PROVIDER_SITE_OTHER): Payer: Commercial Managed Care - PPO | Admitting: *Deleted

## 2016-04-11 DIAGNOSIS — Z7901 Long term (current) use of anticoagulants: Secondary | ICD-10-CM | POA: Diagnosis not present

## 2016-04-11 DIAGNOSIS — Z5181 Encounter for therapeutic drug level monitoring: Secondary | ICD-10-CM

## 2016-04-11 DIAGNOSIS — Z952 Presence of prosthetic heart valve: Secondary | ICD-10-CM

## 2016-04-11 DIAGNOSIS — I359 Nonrheumatic aortic valve disorder, unspecified: Secondary | ICD-10-CM

## 2016-04-11 DIAGNOSIS — Z954 Presence of other heart-valve replacement: Secondary | ICD-10-CM | POA: Diagnosis not present

## 2016-04-11 LAB — POCT INR: INR: 1.9

## 2016-04-18 ENCOUNTER — Other Ambulatory Visit: Payer: Self-pay | Admitting: Internal Medicine

## 2016-04-18 DIAGNOSIS — Z1231 Encounter for screening mammogram for malignant neoplasm of breast: Secondary | ICD-10-CM

## 2016-05-03 ENCOUNTER — Encounter: Payer: Self-pay | Admitting: Cardiovascular Disease

## 2016-05-06 ENCOUNTER — Encounter: Payer: Self-pay | Admitting: Cardiovascular Disease

## 2016-05-06 ENCOUNTER — Ambulatory Visit (INDEPENDENT_AMBULATORY_CARE_PROVIDER_SITE_OTHER): Payer: Commercial Managed Care - PPO | Admitting: Cardiovascular Disease

## 2016-05-06 ENCOUNTER — Ambulatory Visit (INDEPENDENT_AMBULATORY_CARE_PROVIDER_SITE_OTHER): Payer: Commercial Managed Care - PPO | Admitting: *Deleted

## 2016-05-06 VITALS — BP 130/80 | HR 68 | Ht 62.0 in | Wt 172.1 lb

## 2016-05-06 DIAGNOSIS — Z954 Presence of other heart-valve replacement: Secondary | ICD-10-CM | POA: Diagnosis not present

## 2016-05-06 DIAGNOSIS — I359 Nonrheumatic aortic valve disorder, unspecified: Secondary | ICD-10-CM | POA: Diagnosis not present

## 2016-05-06 DIAGNOSIS — Z952 Presence of prosthetic heart valve: Secondary | ICD-10-CM

## 2016-05-06 DIAGNOSIS — Z7901 Long term (current) use of anticoagulants: Secondary | ICD-10-CM | POA: Diagnosis not present

## 2016-05-06 DIAGNOSIS — Z5181 Encounter for therapeutic drug level monitoring: Secondary | ICD-10-CM

## 2016-05-06 LAB — POCT INR: INR: 1.7

## 2016-05-06 NOTE — Patient Instructions (Addendum)
Medication Instructions:  Your physician recommends that you continue on your current medications as directed. Please refer to the Current Medication list given to you today.  Labwork: No new orders.   Testing/Procedures: Your physician has requested that you have an echocardiogram in 6 MONTHS. Echocardiography is a painless test that uses sound waves to create images of your heart. It provides your doctor with information about the size and shape of your heart and how well your heart's chambers and valves are working. This procedure takes approximately one hour. There are no restrictions for this procedure.  Follow-Up: Your physician wants you to follow-up in: 6 MONTHS with Dr Cooper.  You will receive a reminder letter in the mail two months in advance. If you don't receive a letter, please call our office to schedule the follow-up appointment.   Any Other Special Instructions Will Be Listed Below (If Applicable).     If you need a refill on your cardiac medications before your next appointment, please call your pharmacy.   

## 2016-05-06 NOTE — Progress Notes (Signed)
Cardiology Office Note Date:  05/06/2016   ID:  [name], DOB 1956/02/24, MRN [id number]  PCP:  Leeroy Cha, MD (Inactive)  Cardiologist:  Sherren Mocha, MD    Chief Complaint  Patient presents with  . Aortic Valve Disorder     History of Present Illness: [name] is a 60 y.o. female who presents for  follow-up of aortic valve disease.   The patient has a history of severe bicuspid valve aortic stenosis and she underwent mechanical aortic valve replacement in 2012 with a 19 mm mechanical bileaflet valve. Postoperatively she has been noted to have elevated transvalvular gradients. She's undergone transesophageal echo demonstrating no clear evidence of leaflet dysfunction. Mechanism has been felt to be related to her small aortic prosthesis.   Overall the patient is doing fairly well. She is enjoying time with her young grandchildren. She had to start on Januvia in addition to metformin to achieve better glycemic control. She has lost 14 pounds through dietary changes. She denies any change in her symptoms. She has mild shortness of breath with physical exertion but no symptoms with regular activities. No recent lightheadedness or chest pain.  Past Medical History:  Diagnosis Date  . Angina    chest tightness and pressure due to aortic stenosis  . Aortic stenosis   . Diabetes mellitus    newly diagnosed in july, 2012  . GERD (gastroesophageal reflux disease)   . Headache(784.0)    migraines when younger  . Heart murmur    aortic stenosis - dr. Burt Knack is her cardiologist  . Hx of hysterectomy   . Hyperlipidemia   . Multinodular thyroid   . Palpitation   . PONV (postoperative nausea and vomiting)   . Shortness of breath    associated with as    Past Surgical History:  Procedure Laterality Date  . ABDOMINAL HYSTERECTOMY    . AORTIC VALVE REPLACEMENT  06/25/2011   Procedure: AORTIC VALVE REPLACEMENT (AVR);  Surgeon: Tharon Aquas Adelene Idler, MD;  Location:  [facility];  Service: Open Heart Surgery;  Laterality: N/A;  . BUNIONECTOMY    . CARDIAC VALVE REPLACEMENT     Aortic Valve   06/25/2011  . CESAREAN SECTION    . CYSTOSCOPY WITH RETROGRADE PYELOGRAM, URETEROSCOPY AND STENT PLACEMENT Left 10/21/2014   Procedure: CYSTOSCOPY WITH LEFT RETROGRADE PYELOGRAM, URETEROSCOPY BIOPSY AND STENT PLACEMENT;  Surgeon: Alexis Frock, MD;  Location: [facility];  Service: Urology;  Laterality: Left;  . PARTIAL HYSTERECTOMY    . TEE WITHOUT CARDIOVERSION N/A 09/18/2012   Procedure: TRANSESOPHAGEAL ECHOCARDIOGRAM (TEE);  Surgeon: Fay Records, MD;  Location: [facility];  Service: Cardiovascular;  Laterality: N/A;    Current Outpatient Prescriptions  Medication Sig Dispense Refill  . aspirin 81 MG tablet Take 81 mg by mouth every morning.     Marland Kitchen CRESTOR 20 MG tablet TAKE 1 TABLET BY MOUTH ONCE A DAY 90 tablet 1  . metFORMIN (GLUCOPHAGE-XR) 500 MG 24 hr tablet Take 1,000 mg by mouth daily with supper.    . metoprolol tartrate (LOPRESSOR) 25 MG tablet Take 1 tablet (25 mg total) by mouth 2 (two) times daily. 180 tablet 1  . omega-3 acid ethyl esters (LOVAZA) 1 g capsule Take 1 g by mouth daily.    . sitaGLIPtin (JANUVIA) 50 MG tablet Take 50 mg by mouth daily.    Marland Kitchen tolterodine (DETROL LA) 2 MG 24 hr capsule Take 2 mg by mouth daily as needed for bladder spasms.    Marland Kitchen  warfarin (COUMADIN) 5 MG tablet Take as directed by the Coumadin clinic 80 tablet 1   No current facility-administered medications for this visit.     Allergies:   Atorvastatin; Codeine; Morphine and related; Niacin; and Percocet [oxycodone-acetaminophen]   Social History:  The patient  reports that she quit smoking about 21 years ago. Her smoking use included Cigarettes. She has a 15.00 pack-year smoking history. She has never used smokeless tobacco. She reports that she does not drink alcohol or use drugs.   Family History:  The patient's  family history includes COPD in her father; Cancer (age of onset:  19) in her mother; Coronary artery disease in her father; Diabetes in her mother; Hypertension in her other.    ROS:  Please see the history of present illness.  All other systems are reviewed and negative.    PHYSICAL EXAM: VS:  BP 130/80   Pulse 68   Ht 5\' 2"  (1.575 m)   Wt 172 lb 1.9 oz (78.1 kg)   BMI 31.48 kg/m  , BMI Body mass index is 31.48 kg/m. GEN: Well nourished, well developed, in no acute distress  HEENT: normal  Neck: no JVD, no masses. No carotid bruits Cardiac: RRR with 2/6 mid-peaking systolic murmur with crisp mechanical aortic closure sounds              Respiratory:  clear to auscultation bilaterally, normal work of breathing GI: soft, nontender, nondistended, + BS MS: no deformity or atrophy  Ext: no pretibial edema, pedal pulses 2+= bilaterally Skin: warm and dry, no rash Neuro:  Strength and sensation are intact Psych: euthymic mood, full affect  EKG:  EKG is ordered today. The ekg ordered today shows normal sinus rhythm 67 bpm, within normal limits  Recent Labs: No results found for requested labs within last 8760 hours.   Lipid Panel     Component Value Date/Time   CHOL 163 03/03/2013 0732   TRIG 238.0 (H) 03/03/2013 0732   HDL 46.20 03/03/2013 0732   CHOLHDL 4 03/03/2013 0732   VLDL 47.6 (H) 03/03/2013 0732   LDLCALC 91 03/18/2011 0743   LDLDIRECT 93.3 03/03/2013 0732      Wt Readings from Last 3 Encounters:  05/06/16 172 lb 1.9 oz (78.1 kg)  11/03/15 181 lb 12.8 oz (82.5 kg)  01/30/15 176 lb (79.8 kg)     Cardiac Studies Reviewed: Echo 10-31-2015: Left ventricle:  The cavity size was normal. Wall thickness was normal. Systolic function was vigorous. The estimated ejection fraction was in the range of 65% to 70%. Wall motion was normal; there were no regional wall motion abnormalities. Doppler parameters are consistent with abnormal left ventricular relaxation (grade 1 diastolic  dysfunction).  ------------------------------------------------------------------- Aortic valve:  The valve is not seen well. The patient has a hx of mechanical aortic valve replacement . There is a mean aortic valve gradient of 33 mm hg. This is unchanged from previous echo reports . i was not able to pull up old echo images. A mechanical prosthesis was present.  Doppler:     VTI ratio of LVOT to aortic valve: 0.39. Valve area (VTI): 0.98 cm^2. Indexed valve area (VTI): 0.52 cm^2/m^2. Peak velocity ratio of LVOT to aortic valve: 0.35. Valve area (Vmax): 0.88 cm^2. Indexed valve area (Vmax): 0.46 cm^2/m^2. Mean velocity ratio of LVOT to aortic valve: 0.34. Valve area (Vmean): 0.88 cm^2. Indexed valve area (Vmean): 0.46 cm^2/m^2. Mean gradient (S): 33 mm Hg. Peak gradient (S): 62 mm Hg.  -------------------------------------------------------------------  Aorta:  Aortic root: The aortic root was normal in size. Ascending aorta: The ascending aorta was normal in size.  ------------------------------------------------------------------- Mitral valve:   Structurally normal valve.   Leaflet separation was normal.  Doppler:  Transvalvular velocity was within the normal range. There was no evidence for stenosis. There was no regurgitation.    Peak gradient (D): 4 mm Hg.  ------------------------------------------------------------------- Left atrium:  The atrium was normal in size.  ------------------------------------------------------------------- Right ventricle:  The cavity size was normal. Systolic function was normal.  ------------------------------------------------------------------- Pulmonic valve:    Structurally normal valve.   Cusp separation was normal.  Doppler:  Transvalvular velocity was within the normal range. There was no regurgitation.  ------------------------------------------------------------------- Tricuspid valve:   Structurally normal valve.   Leaflet  separation was normal.  Doppler:  Transvalvular velocity was within the normal range. There was trivial regurgitation.  ------------------------------------------------------------------- Right atrium:  The atrium was normal in size.  ------------------------------------------------------------------- Pericardium:  There was no pericardial effusion.   ASSESSMENT AND PLAN: 1.  Aortic valve disease status post mechanical aVR: Stable class II symptoms. Will update her echocardiogram prior to her 6 month follow-up visit. SBE prophylaxis guidelines are reviewed. Moderately elevated gradients likely related to patient prosthesis mismatch.  2. Hyperlipidemia: Most recent lipids with a cholesterol 175, LDL 77, HDL 45, triglycerides 245. Advised her triglyceride level will likely be improved with the weight loss and better glycemic control. Continues on Crestor.  3. Chronic oral anticoagulation/use of chronic anticoagulant drug: No further bleeding problems. Continues on low-dose aspirin along with warfarin.  Current medicines are reviewed with the patient today.  The patient does not have concerns regarding medicines.  Labs/ tests ordered today include:  No orders of the defined types were placed in this encounter.  Disposition:   FU 6 months with an echo prior to the office visit  Signed, Sherren Mocha, MD  05/06/2016 12:29 PM    [address], [place], [place]  [address] Phone: [phone]; Fax: [phone]

## 2016-05-20 ENCOUNTER — Ambulatory Visit
Admission: RE | Admit: 2016-05-20 | Discharge: 2016-05-20 | Disposition: A | Discharge status: Home / Self Care | Payer: Commercial Managed Care - PPO | Source: Ambulatory Visit | Attending: Internal Medicine | Admitting: Internal Medicine

## 2016-05-20 ENCOUNTER — Ambulatory Visit (INDEPENDENT_AMBULATORY_CARE_PROVIDER_SITE_OTHER): Payer: Commercial Managed Care - PPO | Admitting: *Deleted

## 2016-05-20 DIAGNOSIS — Z5181 Encounter for therapeutic drug level monitoring: Secondary | ICD-10-CM | POA: Diagnosis not present

## 2016-05-20 DIAGNOSIS — Z1231 Encounter for screening mammogram for malignant neoplasm of breast: Secondary | ICD-10-CM

## 2016-05-20 DIAGNOSIS — Z7901 Long term (current) use of anticoagulants: Secondary | ICD-10-CM | POA: Diagnosis not present

## 2016-05-20 DIAGNOSIS — Z952 Presence of prosthetic heart valve: Secondary | ICD-10-CM

## 2016-05-20 DIAGNOSIS — I359 Nonrheumatic aortic valve disorder, unspecified: Secondary | ICD-10-CM | POA: Diagnosis not present

## 2016-05-20 LAB — POCT INR: INR: 2.2

## 2016-06-03 ENCOUNTER — Other Ambulatory Visit: Payer: Self-pay | Admitting: Cardiovascular Disease

## 2016-06-07 ENCOUNTER — Ambulatory Visit (INDEPENDENT_AMBULATORY_CARE_PROVIDER_SITE_OTHER): Payer: Commercial Managed Care - PPO | Admitting: Pharmacist

## 2016-06-07 DIAGNOSIS — Z952 Presence of prosthetic heart valve: Secondary | ICD-10-CM | POA: Diagnosis not present

## 2016-06-07 DIAGNOSIS — Z7901 Long term (current) use of anticoagulants: Secondary | ICD-10-CM | POA: Diagnosis not present

## 2016-06-07 DIAGNOSIS — Z5181 Encounter for therapeutic drug level monitoring: Secondary | ICD-10-CM | POA: Diagnosis not present

## 2016-06-07 DIAGNOSIS — I359 Nonrheumatic aortic valve disorder, unspecified: Secondary | ICD-10-CM

## 2016-06-07 LAB — POCT INR: INR: 2.2

## 2016-07-09 ENCOUNTER — Ambulatory Visit (INDEPENDENT_AMBULATORY_CARE_PROVIDER_SITE_OTHER): Payer: Commercial Managed Care - PPO

## 2016-07-09 DIAGNOSIS — Z5181 Encounter for therapeutic drug level monitoring: Secondary | ICD-10-CM

## 2016-07-09 DIAGNOSIS — Z952 Presence of prosthetic heart valve: Secondary | ICD-10-CM

## 2016-07-09 DIAGNOSIS — Z7901 Long term (current) use of anticoagulants: Secondary | ICD-10-CM | POA: Diagnosis not present

## 2016-07-09 DIAGNOSIS — I359 Nonrheumatic aortic valve disorder, unspecified: Secondary | ICD-10-CM

## 2016-07-09 LAB — POCT INR: INR: 2.8

## 2016-07-29 ENCOUNTER — Ambulatory Visit: Payer: Commercial Managed Care - PPO | Admitting: Cardiovascular Disease

## 2016-08-15 ENCOUNTER — Ambulatory Visit (INDEPENDENT_AMBULATORY_CARE_PROVIDER_SITE_OTHER): Payer: Commercial Managed Care - PPO | Admitting: *Deleted

## 2016-08-15 DIAGNOSIS — I359 Nonrheumatic aortic valve disorder, unspecified: Secondary | ICD-10-CM

## 2016-08-15 DIAGNOSIS — Z5181 Encounter for therapeutic drug level monitoring: Secondary | ICD-10-CM | POA: Diagnosis not present

## 2016-08-15 DIAGNOSIS — Z952 Presence of prosthetic heart valve: Secondary | ICD-10-CM

## 2016-08-15 DIAGNOSIS — Z7901 Long term (current) use of anticoagulants: Secondary | ICD-10-CM

## 2016-08-15 LAB — POCT INR: INR: 2

## 2016-09-23 ENCOUNTER — Other Ambulatory Visit: Payer: Self-pay | Admitting: Cardiovascular Disease

## 2016-09-26 ENCOUNTER — Ambulatory Visit (INDEPENDENT_AMBULATORY_CARE_PROVIDER_SITE_OTHER): Payer: Commercial Managed Care - PPO | Admitting: *Deleted

## 2016-09-26 DIAGNOSIS — I359 Nonrheumatic aortic valve disorder, unspecified: Secondary | ICD-10-CM

## 2016-09-26 DIAGNOSIS — Z7901 Long term (current) use of anticoagulants: Secondary | ICD-10-CM

## 2016-09-26 DIAGNOSIS — Z952 Presence of prosthetic heart valve: Secondary | ICD-10-CM | POA: Diagnosis not present

## 2016-09-26 DIAGNOSIS — Z5181 Encounter for therapeutic drug level monitoring: Secondary | ICD-10-CM

## 2016-09-26 LAB — POCT INR: INR: 2.8

## 2016-10-24 DIAGNOSIS — E119 Type 2 diabetes mellitus without complications: Secondary | ICD-10-CM | POA: Diagnosis not present

## 2016-10-24 DIAGNOSIS — E7800 Pure hypercholesterolemia, unspecified: Secondary | ICD-10-CM | POA: Diagnosis not present

## 2016-10-24 DIAGNOSIS — Z952 Presence of prosthetic heart valve: Secondary | ICD-10-CM | POA: Diagnosis not present

## 2016-10-25 ENCOUNTER — Ambulatory Visit (INDEPENDENT_AMBULATORY_CARE_PROVIDER_SITE_OTHER): Payer: Commercial Managed Care - PPO | Admitting: *Deleted

## 2016-10-25 ENCOUNTER — Ambulatory Visit (INDEPENDENT_AMBULATORY_CARE_PROVIDER_SITE_OTHER): Payer: Commercial Managed Care - PPO | Admitting: Cardiovascular Disease

## 2016-10-25 ENCOUNTER — Encounter: Payer: Self-pay | Admitting: Cardiovascular Disease

## 2016-10-25 ENCOUNTER — Ambulatory Visit (HOSPITAL_COMMUNITY): Payer: Commercial Managed Care - PPO | Attending: Cardiology

## 2016-10-25 ENCOUNTER — Other Ambulatory Visit: Payer: Self-pay

## 2016-10-25 VITALS — BP 120/60 | HR 62 | Ht 62.0 in | Wt 180.4 lb

## 2016-10-25 DIAGNOSIS — E119 Type 2 diabetes mellitus without complications: Secondary | ICD-10-CM | POA: Diagnosis not present

## 2016-10-25 DIAGNOSIS — Z952 Presence of prosthetic heart valve: Secondary | ICD-10-CM

## 2016-10-25 DIAGNOSIS — Z7901 Long term (current) use of anticoagulants: Secondary | ICD-10-CM

## 2016-10-25 DIAGNOSIS — Z8249 Family history of ischemic heart disease and other diseases of the circulatory system: Secondary | ICD-10-CM | POA: Diagnosis not present

## 2016-10-25 DIAGNOSIS — Z87891 Personal history of nicotine dependence: Secondary | ICD-10-CM | POA: Insufficient documentation

## 2016-10-25 DIAGNOSIS — I119 Hypertensive heart disease without heart failure: Secondary | ICD-10-CM | POA: Diagnosis not present

## 2016-10-25 DIAGNOSIS — I359 Nonrheumatic aortic valve disorder, unspecified: Secondary | ICD-10-CM | POA: Diagnosis not present

## 2016-10-25 DIAGNOSIS — Z5181 Encounter for therapeutic drug level monitoring: Secondary | ICD-10-CM

## 2016-10-25 LAB — POCT INR: INR: 2.4

## 2016-10-25 NOTE — Progress Notes (Signed)
Cardiology Office Note Date:  10/25/2016   ID:  [name], DOB 07/19/1956, MRN [id number]  PCP:  Leeroy Cha, MD  Cardiologist:  Sherren Mocha, MD    Chief Complaint  Patient presents with  . Aortic Stenosis    s/p AVR     History of Present Illness: [name] is a 61 y.o. female who presents for follow-up of aortic valve disease.   The patient has a history of severe bicuspid valve aortic stenosis and she underwent mechanical aortic valve replacement in 2012 with a 19 mm mechanical bileaflet valve. Postoperatively she has been noted to have elevated transvalvular gradients. She's undergone transesophageal echo demonstrating no clear evidence of leaflet dysfunction. Mechanism has been felt to be related to her small aortic prosthesis and vigorous LV function.   The patient reports no change in symptoms since she was seen last. She is able to do all of her normal activities without shortness of breath, chest pain, or lightheadedness. She denies orthopnea, edema, or PND. She is able to play with her grandchildren without any symptoms. If she walks briskly has mild shortness of breath.  Past Medical History:  Diagnosis Date  . Angina    chest tightness and pressure due to aortic stenosis  . Aortic stenosis   . Diabetes mellitus    newly diagnosed in july, 2012  . GERD (gastroesophageal reflux disease)   . Headache(784.0)    migraines when younger  . Heart murmur    aortic stenosis - dr. Burt Knack is her cardiologist  . Hx of hysterectomy   . Hyperlipidemia   . Multinodular thyroid   . Palpitation   . PONV (postoperative nausea and vomiting)   . Shortness of breath    associated with as    Past Surgical History:  Procedure Laterality Date  . ABDOMINAL HYSTERECTOMY    . AORTIC VALVE REPLACEMENT  06/25/2011   Procedure: AORTIC VALVE REPLACEMENT (AVR);  Surgeon: Tharon Aquas Adelene Idler, MD;  Location: [place];  Service: Open Heart Surgery;  Laterality: N/A;  .  BUNIONECTOMY    . CARDIAC VALVE REPLACEMENT     Aortic Valve   06/25/2011  . CESAREAN SECTION    . CYSTOSCOPY WITH RETROGRADE PYELOGRAM, URETEROSCOPY AND STENT PLACEMENT Left 10/21/2014   Procedure: CYSTOSCOPY WITH LEFT RETROGRADE PYELOGRAM, URETEROSCOPY BIOPSY AND STENT PLACEMENT;  Surgeon: Alexis Frock, MD;  Location: [facility];  Service: Urology;  Laterality: Left;  . PARTIAL HYSTERECTOMY    . TEE WITHOUT CARDIOVERSION N/A 09/18/2012   Procedure: TRANSESOPHAGEAL ECHOCARDIOGRAM (TEE);  Surgeon: Fay Records, MD;  Location: [facility] ENDOSCOPY;  Service: Cardiovascular;  Laterality: N/A;    Current Outpatient Prescriptions  Medication Sig Dispense Refill  . aspirin 81 MG tablet Take 81 mg by mouth every morning.     Marland Kitchen CRESTOR 20 MG tablet TAKE 1 TABLET BY MOUTH ONCE A DAY 90 tablet 1  . ergocalciferol (VITAMIN D2) 50000 units capsule Take 50,000 Units by mouth once a week.    . metFORMIN (GLUCOPHAGE-XR) 500 MG 24 hr tablet Take 1,000 mg by mouth daily with supper.    . metoprolol tartrate (LOPRESSOR) 25 MG tablet Take 1 tablet (25 mg total) by mouth 2 (two) times daily. 180 tablet 1  . omega-3 acid ethyl esters (LOVAZA) 1 g capsule Take 1 g by mouth daily.    . sitaGLIPtin (JANUVIA) 50 MG tablet Take 50 mg by mouth daily.    Marland Kitchen tolterodine (DETROL LA) 2 MG 24 hr capsule  Take 2 mg by mouth daily as needed for bladder spasms.    Marland Kitchen warfarin (COUMADIN) 5 MG tablet TAKE AS DIRECTED BY THE COUMADIN CLINIC 90 tablet 1   No current facility-administered medications for this visit.     Allergies:   Atorvastatin; Codeine; Morphine and related; Niacin; and Percocet [oxycodone-acetaminophen]   Social History:  The patient  reports that she quit smoking about 22 years ago. Her smoking use included Cigarettes. She has a 15.00 pack-year smoking history. She has never used smokeless tobacco. She reports that she does not drink alcohol or use drugs.   Family History:  The patient's  family history includes COPD in  her father; Cancer (age of onset: 1) in her mother; Coronary artery disease in her father; Diabetes in her mother; Hypertension in her other.    ROS:  Please see the history of present illness.  All other systems are reviewed and negative.    PHYSICAL EXAM: VS:  BP 120/60   Pulse 62   Ht 5\' 2"  (1.575 m)   Wt 180 lb 6.4 oz (81.8 kg)   BMI 33.00 kg/m  , BMI Body mass index is 33 kg/m. GEN: Well nourished, well developed, in no acute distress  HEENT: normal  Neck: no JVD, no masses. No carotid bruits Cardiac: RRR with a 2/6 systolic murmur at the RUSB and crisp mechanical A2                Respiratory:  clear to auscultation bilaterally, normal work of breathing GI: soft, nontender, nondistended, + BS MS: no deformity or atrophy  Ext: no pretibial edema, pedal pulses 2+= bilaterally Skin: warm and dry, no rash Neuro:  Strength and sensation are intact Psych: euthymic mood, full affect  EKG:  EKG is ordered today. The ekg ordered today shows NSR 62 bpm, within normal limits  Recent Labs: No results found for requested labs within last 8760 hours.   Lipid Panel     Component Value Date/Time   CHOL 163 03/03/2013 0732   TRIG 238.0 (H) 03/03/2013 0732   HDL 46.20 03/03/2013 0732   CHOLHDL 4 03/03/2013 0732   VLDL 47.6 (H) 03/03/2013 0732   LDLCALC 91 03/18/2011 0743   LDLDIRECT 93.3 03/03/2013 0732      Wt Readings from Last 3 Encounters:  10/25/16 180 lb 6.4 oz (81.8 kg)  05/06/16 172 lb 1.9 oz (78.1 kg)  11/03/15 181 lb 12.8 oz (82.5 kg)     Cardiac Studies Reviewed: 2D Echo: ------------------------------------------------------------------- Left ventricle:  The cavity size was normal. There was mild concentric hypertrophy. Systolic function was normal. The estimated ejection fraction was in the range of 60% to 65%. Wall motion was normal; there were no regional wall motion  abnormalities.  ------------------------------------------------------------------- Aortic valve:  Poorly visualized. A mechanical prosthesis was present. Mobility was not restricted.  Doppler:  There was no regurgitation.    VTI ratio of LVOT to aortic valve: 0.33. Valve area (VTI): 1.15 cm^2. Indexed valve area (VTI): 0.61 cm^2/m^2. Peak velocity ratio of LVOT to aortic valve: 0.31. Valve area (Vmax): 1.08 cm^2. Indexed valve area (Vmax): 0.57 cm^2/m^2. Mean velocity ratio of LVOT to aortic valve: 0.34. Valve area (Vmean): 1.19 cm^2. Indexed valve area (Vmean): 0.63 cm^2/m^2.    Mean gradient (S): 37 mm Hg. Peak gradient (S): 70 mm Hg.  ------------------------------------------------------------------- Aorta:  Aortic root: The aortic root was normal in size.  ------------------------------------------------------------------- Mitral valve:   Structurally normal valve.   Mobility was not restricted.  Doppler:  Transvalvular velocity was within the normal range. There was no evidence for stenosis. There was no regurgitation.    Peak gradient (D): 4 mm Hg.  ------------------------------------------------------------------- Left atrium:  The atrium was mildly dilated.  ------------------------------------------------------------------- Right ventricle:  The cavity size was normal. Wall thickness was normal. Systolic function was normal.  ------------------------------------------------------------------- Pulmonic valve:    Doppler:  Transvalvular velocity was within the normal range. There was no evidence for stenosis.  ------------------------------------------------------------------- Tricuspid valve:   Structurally normal valve.    Doppler: Transvalvular velocity was within the normal range. There was trivial regurgitation.  ------------------------------------------------------------------- Pulmonary artery:   The main pulmonary artery was normal-sized. Systolic  pressure was within the normal range.  ------------------------------------------------------------------- Right atrium:  The atrium was normal in size.  ------------------------------------------------------------------- Pericardium:  There was no pericardial effusion.  ------------------------------------------------------------------- Systemic veins: Inferior vena cava: The vessel was normal in size.  ------------------------------------------------------------------- Measurements   Left ventricle                           Value          Reference  LV ID, ED, PLAX chordal          (L)     37.3  mm       43 - 52  LV ID, ES, PLAX chordal          (L)     22.6  mm       23 - 38  LV fx shortening, PLAX chordal           39    %        >=29  LV PW thickness, ED                      12.9  mm       ----------  IVS/LV PW ratio, ED                      0.99           <=1.3  Stroke volume, 2D                        121   ml       ----------  Stroke volume/bsa, 2D                    64    ml/m^2   ----------  LV e&', lateral                           9.57  cm/s     ----------  LV E/e&', lateral                         11.08          ----------  LV e&', medial                            8.16  cm/s     ----------  LV E/e&', medial                          12.99          ----------  LV e&', average  8.87  cm/s     ----------  LV E/e&', average                         11.96          ----------    Ventricular septum                       Value          Reference  IVS thickness, ED                        12.8  mm       ----------    LVOT                                     Value          Reference  LVOT ID, S                               21    mm       ----------  LVOT area                                3.46  cm^2     ----------  LVOT peak velocity, S                    130   cm/s     ----------  LVOT mean velocity, S                    96.5  cm/s      ----------  LVOT VTI, S                              34.9  cm       ----------  LVOT peak gradient, S                    7     mm Hg    ----------    Aortic valve                             Value          Reference  Aortic valve peak velocity, S            418   cm/s     ----------  Aortic valve mean velocity, S            281   cm/s     ----------  Aortic valve VTI, S                      105   cm       ----------  Aortic mean gradient, S                  37    mm Hg    ----------  Aortic peak gradient, S                  70    mm Hg    ----------  VTI ratio, LVOT/AV                       0.33           ----------  Aortic valve area, VTI                   1.15  cm^2     ----------  Aortic valve area/bsa, VTI               0.61  cm^2/m^2 ----------  Velocity ratio, peak, LVOT/AV            0.31           ----------  Aortic valve area, peak velocity         1.08  cm^2     ----------  Aortic valve area/bsa, peak              0.57  cm^2/m^2 ----------  velocity  Velocity ratio, mean, LVOT/AV            0.34           ----------  Aortic valve area, mean velocity         1.19  cm^2     ----------  Aortic valve area/bsa, mean              0.63  cm^2/m^2 ----------  velocity    Aorta                                    Value          Reference  Aortic root ID, ED                       31    mm       ----------  Ascending aorta ID, A-P, S               28    mm       ----------    Left atrium                              Value          Reference  LA ID, A-P, ES                           45    mm       ----------  LA ID/bsa, A-P                   (H)     2.4   cm/m^2   <=2.2  LA volume, S                             43.3  ml       ----------  LA volume/bsa, S                         23.1  ml/m^2   ----------  LA volume, ES, 1-p A4C                   39.2  ml       ----------  LA volume/bsa, ES, 1-p A4C  20.9  ml/m^2   ----------  LA volume, ES, 1-p A2C                   43.7  ml        ----------  LA volume/bsa, ES, 1-p A2C               23.3  ml/m^2   ----------    Mitral valve                             Value          Reference  Mitral E-wave peak velocity              106   cm/s     ----------  Mitral A-wave peak velocity              129   cm/s     ----------  Mitral deceleration time         (H)     313   ms       150 - 230  Mitral peak gradient, D                  4     mm Hg    ----------  Mitral E/A ratio, peak                   0.8            ----------    Pulmonary arteries                       Value          Reference  PA pressure, S, DP                       28    mm Hg    <=30    Tricuspid valve                          Value          Reference  Tricuspid regurg peak velocity           249   cm/s     ----------  Tricuspid peak RV-RA gradient            25    mm Hg    ----------    Right atrium                             Value          Reference  RA ID, S-I, ES, A4C                      45.1  mm       34 - 49  RA area, ES, A4C                         10.4  cm^2     8.3 - 19.5  RA volume, ES, A/L                       20.1  ml       ----------  RA volume/bsa, ES, A/L  10.7  ml/m^2   ----------    Right ventricle                          Value          Reference  TAPSE                                    22.3  mm       ----------  RV s&', lateral, S                        17.2  cm/s     ----------   ASSESSMENT AND PLAN: 1.  Aortic valve disease status post mechanical aortic valve replacement: The patient is stable with class II symptoms. Her echocardiogram is reviewed and her mean gradient is increased from 30 supportive 37 mmHg from the time of her last echo study. Her exam is unchanged. She is minimally symptomatic. Her aortic valve dimensionless index is 0.33. Overall I think her aortic valve disease is stable with increased velocities related to her vigorous LV function along with patient prosthesis mismatch as she was treated  with a 19 mm mechanical valve. Will follow-up with her repeat medical evaluation in 6 months and a repeat echo study in one year. She remains on a combination of aspirin and warfarin.  2. Hyperlipidemia: Lifestyle modification reviewed. She is treated with Crestor.  3. Chronic anticoagulation: No bleeding problems. Continue aspirin and warfarin.  4. Type 2 diabetes: Managed with metformin and Januvia. Treated by her primary care physician. Lifestyle modification discussed today.  Current medicines are reviewed with the patient today.  The patient does not have concerns regarding medicines.  Labs/ tests ordered today include:  No orders of the defined types were placed in this encounter.  Disposition:   FU 6 months  Signed, Sherren Mocha, MD  10/25/2016 2:24 PM    [facility] [place], [place], [place]  [address] Phone: [phone]; Fax: [phone]

## 2016-10-25 NOTE — Patient Instructions (Signed)

## 2016-11-12 DIAGNOSIS — L821 Other seborrheic keratosis: Secondary | ICD-10-CM | POA: Diagnosis not present

## 2016-11-12 DIAGNOSIS — D225 Melanocytic nevi of trunk: Secondary | ICD-10-CM | POA: Diagnosis not present

## 2016-11-12 DIAGNOSIS — L570 Actinic keratosis: Secondary | ICD-10-CM | POA: Diagnosis not present

## 2016-11-12 DIAGNOSIS — L814 Other melanin hyperpigmentation: Secondary | ICD-10-CM | POA: Diagnosis not present

## 2016-12-05 DIAGNOSIS — A0839 Other viral enteritis: Secondary | ICD-10-CM | POA: Diagnosis not present

## 2016-12-13 ENCOUNTER — Ambulatory Visit (INDEPENDENT_AMBULATORY_CARE_PROVIDER_SITE_OTHER): Payer: Commercial Managed Care - PPO | Admitting: *Deleted

## 2016-12-13 DIAGNOSIS — Z952 Presence of prosthetic heart valve: Secondary | ICD-10-CM

## 2016-12-13 DIAGNOSIS — Z7901 Long term (current) use of anticoagulants: Secondary | ICD-10-CM

## 2016-12-13 DIAGNOSIS — I359 Nonrheumatic aortic valve disorder, unspecified: Secondary | ICD-10-CM

## 2016-12-13 DIAGNOSIS — Z5181 Encounter for therapeutic drug level monitoring: Secondary | ICD-10-CM

## 2016-12-13 LAB — POCT INR: INR: 2.3

## 2016-12-19 DIAGNOSIS — Z952 Presence of prosthetic heart valve: Secondary | ICD-10-CM | POA: Diagnosis not present

## 2016-12-19 DIAGNOSIS — R112 Nausea with vomiting, unspecified: Secondary | ICD-10-CM | POA: Diagnosis not present

## 2016-12-19 DIAGNOSIS — R110 Nausea: Secondary | ICD-10-CM | POA: Diagnosis not present

## 2016-12-19 DIAGNOSIS — E119 Type 2 diabetes mellitus without complications: Secondary | ICD-10-CM | POA: Diagnosis not present

## 2016-12-23 ENCOUNTER — Encounter: Payer: Self-pay | Admitting: Cardiovascular Disease

## 2016-12-24 DIAGNOSIS — Z7984 Long term (current) use of oral hypoglycemic drugs: Secondary | ICD-10-CM | POA: Diagnosis not present

## 2016-12-24 DIAGNOSIS — E119 Type 2 diabetes mellitus without complications: Secondary | ICD-10-CM | POA: Diagnosis not present

## 2016-12-24 DIAGNOSIS — E781 Pure hyperglyceridemia: Secondary | ICD-10-CM | POA: Diagnosis not present

## 2017-01-10 DIAGNOSIS — E559 Vitamin D deficiency, unspecified: Secondary | ICD-10-CM | POA: Diagnosis not present

## 2017-01-10 DIAGNOSIS — Z23 Encounter for immunization: Secondary | ICD-10-CM | POA: Diagnosis not present

## 2017-01-10 DIAGNOSIS — E119 Type 2 diabetes mellitus without complications: Secondary | ICD-10-CM | POA: Diagnosis not present

## 2017-01-10 DIAGNOSIS — E7800 Pure hypercholesterolemia, unspecified: Secondary | ICD-10-CM | POA: Diagnosis not present

## 2017-01-10 DIAGNOSIS — Z0000 Encounter for general adult medical examination without abnormal findings: Secondary | ICD-10-CM | POA: Diagnosis not present

## 2017-01-21 DIAGNOSIS — L910 Hypertrophic scar: Secondary | ICD-10-CM | POA: Diagnosis not present

## 2017-01-24 ENCOUNTER — Ambulatory Visit (INDEPENDENT_AMBULATORY_CARE_PROVIDER_SITE_OTHER): Payer: Commercial Managed Care - PPO | Admitting: *Deleted

## 2017-01-24 DIAGNOSIS — Z7901 Long term (current) use of anticoagulants: Secondary | ICD-10-CM | POA: Diagnosis not present

## 2017-01-24 DIAGNOSIS — Z952 Presence of prosthetic heart valve: Secondary | ICD-10-CM

## 2017-01-24 DIAGNOSIS — Z5181 Encounter for therapeutic drug level monitoring: Secondary | ICD-10-CM

## 2017-01-24 DIAGNOSIS — I359 Nonrheumatic aortic valve disorder, unspecified: Secondary | ICD-10-CM

## 2017-01-24 LAB — POCT INR: INR: 1.8

## 2017-02-06 DIAGNOSIS — J028 Acute pharyngitis due to other specified organisms: Secondary | ICD-10-CM | POA: Diagnosis not present

## 2017-02-06 DIAGNOSIS — J029 Acute pharyngitis, unspecified: Secondary | ICD-10-CM | POA: Diagnosis not present

## 2017-02-20 ENCOUNTER — Ambulatory Visit (INDEPENDENT_AMBULATORY_CARE_PROVIDER_SITE_OTHER): Payer: Commercial Managed Care - PPO | Admitting: *Deleted

## 2017-02-20 DIAGNOSIS — Z952 Presence of prosthetic heart valve: Secondary | ICD-10-CM | POA: Diagnosis not present

## 2017-02-20 DIAGNOSIS — I359 Nonrheumatic aortic valve disorder, unspecified: Secondary | ICD-10-CM | POA: Diagnosis not present

## 2017-02-20 DIAGNOSIS — Z7901 Long term (current) use of anticoagulants: Secondary | ICD-10-CM

## 2017-02-20 DIAGNOSIS — Z5181 Encounter for therapeutic drug level monitoring: Secondary | ICD-10-CM

## 2017-02-20 LAB — POCT INR: INR: 2

## 2017-03-10 ENCOUNTER — Other Ambulatory Visit: Payer: Self-pay | Admitting: *Deleted

## 2017-03-10 MED ORDER — WARFARIN SODIUM 5 MG PO TABS: ORAL_TABLET | ORAL | 1 refills | Status: DC

## 2017-03-10 NOTE — Telephone Encounter (Signed)
Paper refill received for Warfarin 5mg  received; sent in refill request to requested Pharmacy.

## 2017-03-27 ENCOUNTER — Ambulatory Visit (INDEPENDENT_AMBULATORY_CARE_PROVIDER_SITE_OTHER): Payer: Commercial Managed Care - PPO | Admitting: *Deleted

## 2017-03-27 DIAGNOSIS — Z7901 Long term (current) use of anticoagulants: Secondary | ICD-10-CM | POA: Diagnosis not present

## 2017-03-27 DIAGNOSIS — Z952 Presence of prosthetic heart valve: Secondary | ICD-10-CM

## 2017-03-27 DIAGNOSIS — Z5181 Encounter for therapeutic drug level monitoring: Secondary | ICD-10-CM | POA: Diagnosis not present

## 2017-03-27 DIAGNOSIS — I359 Nonrheumatic aortic valve disorder, unspecified: Secondary | ICD-10-CM

## 2017-03-27 LAB — POCT INR: INR: 2.4

## 2017-04-23 ENCOUNTER — Other Ambulatory Visit: Payer: Self-pay | Admitting: Internal Medicine

## 2017-04-23 DIAGNOSIS — Z1231 Encounter for screening mammogram for malignant neoplasm of breast: Secondary | ICD-10-CM

## 2017-05-01 ENCOUNTER — Ambulatory Visit (INDEPENDENT_AMBULATORY_CARE_PROVIDER_SITE_OTHER): Payer: Commercial Managed Care - PPO | Admitting: *Deleted

## 2017-05-01 DIAGNOSIS — Z5181 Encounter for therapeutic drug level monitoring: Secondary | ICD-10-CM | POA: Diagnosis not present

## 2017-05-01 DIAGNOSIS — Z952 Presence of prosthetic heart valve: Secondary | ICD-10-CM

## 2017-05-01 DIAGNOSIS — Z7901 Long term (current) use of anticoagulants: Secondary | ICD-10-CM | POA: Diagnosis not present

## 2017-05-01 DIAGNOSIS — I359 Nonrheumatic aortic valve disorder, unspecified: Secondary | ICD-10-CM | POA: Diagnosis not present

## 2017-05-01 LAB — POCT INR: INR: 2.1

## 2017-05-06 DIAGNOSIS — R509 Fever, unspecified: Secondary | ICD-10-CM | POA: Diagnosis not present

## 2017-05-14 ENCOUNTER — Encounter: Payer: Self-pay | Admitting: Cardiovascular Disease

## 2017-05-14 ENCOUNTER — Ambulatory Visit (INDEPENDENT_AMBULATORY_CARE_PROVIDER_SITE_OTHER): Payer: Commercial Managed Care - PPO | Admitting: Cardiovascular Disease

## 2017-05-14 VITALS — BP 130/70 | HR 75 | Ht 62.0 in | Wt 180.1 lb

## 2017-05-14 DIAGNOSIS — I359 Nonrheumatic aortic valve disorder, unspecified: Secondary | ICD-10-CM

## 2017-05-14 DIAGNOSIS — E785 Hyperlipidemia, unspecified: Secondary | ICD-10-CM | POA: Diagnosis not present

## 2017-05-14 NOTE — Patient Instructions (Signed)
Medication Instructions:  Your provider recommends that you continue on your current medications as directed. Please refer to the Current Medication list given to you today.    Labwork: None  Testing/Procedures: Your provider has requested that you have an echocardiogram. Echocardiography is a painless test that uses sound waves to create images of your heart. It provides your doctor with information about the size and shape of your heart and how well your heart's chambers and valves are working. This procedure takes approximately one hour. There are no restrictions for this procedure.  Follow-Up: Your provider wants you to follow-up in: 6 months with Dr. Cooper. You will receive a reminder letter in the mail two months in advance. If you don't receive a letter, please call our office to schedule the follow-up appointment.    Any Other Special Instructions Will Be Listed Below (If Applicable).     If you need a refill on your cardiac medications before your next appointment, please call your pharmacy.   

## 2017-05-14 NOTE — Progress Notes (Signed)
Cardiology Office Note Date:  05/14/2017   ID:  [name], DOB 1956-07-17, MRN [id number]  PCP:  Lanice Shirts, MD  Cardiologist:  Sherren Mocha, MD    Chief Complaint  Patient presents with  . Follow-up     History of Present Illness: [name] is a 61 y.o. female who presents for follow-up of aortic valve disease.   The patient has a history of severe bicuspid valve aortic stenosis and she underwent mechanical aortic valve replacement in 2012 with a 19 mm mechanical bileaflet valve. Postoperatively she has been noted to have elevated transvalvular gradients. She's undergone transesophageal echo demonstrating no clear evidence of leaflet dysfunction. Mechanism has been felt to be related to her small aortic prosthesis and vigorous LV function (patient-prosthesis mismatch).   She is here alone today. Doing well. Still has mild exertional chest discomfort when walking up stairs too fast or carrying boxes. Symptoms resolve quickly with rest. No progression of symptoms over the past year. No resting symptoms. Occasional lightheadedness with more vigorous exertion, also longstanding without change.   Past Medical History:  Diagnosis Date  . Angina    chest tightness and pressure due to aortic stenosis  . Aortic stenosis   . Diabetes mellitus    newly diagnosed in july, 2012  . GERD (gastroesophageal reflux disease)   . Headache(784.0)    migraines when younger  . Heart murmur    aortic stenosis - dr. Burt Knack is her cardiologist  . Hx of hysterectomy   . Hyperlipidemia   . Multinodular thyroid   . Palpitation   . PONV (postoperative nausea and vomiting)   . Shortness of breath    associated with as    Past Surgical History:  Procedure Laterality Date  . ABDOMINAL HYSTERECTOMY    . AORTIC VALVE REPLACEMENT  06/25/2011   Procedure: AORTIC VALVE REPLACEMENT (AVR);  Surgeon: Tharon Aquas Adelene Idler, MD;  Location: [facility];  Service: Open Heart Surgery;  Laterality:  N/A;  . BUNIONECTOMY    . CARDIAC VALVE REPLACEMENT     Aortic Valve   06/25/2011  . CESAREAN SECTION    . CYSTOSCOPY WITH RETROGRADE PYELOGRAM, URETEROSCOPY AND STENT PLACEMENT Left 10/21/2014   Procedure: CYSTOSCOPY WITH LEFT RETROGRADE PYELOGRAM, URETEROSCOPY BIOPSY AND STENT PLACEMENT;  Surgeon: Alexis Frock, MD;  Location: [facility];  Service: Urology;  Laterality: Left;  . PARTIAL HYSTERECTOMY    . TEE WITHOUT CARDIOVERSION N/A 09/18/2012   Procedure: TRANSESOPHAGEAL ECHOCARDIOGRAM (TEE);  Surgeon: Fay Records, MD;  Location: [facility];  Service: Cardiovascular;  Laterality: N/A;    Current Outpatient Prescriptions  Medication Sig Dispense Refill  . aspirin 81 MG tablet Take 81 mg by mouth every morning.     Marland Kitchen CRESTOR 20 MG tablet TAKE 1 TABLET BY MOUTH ONCE A DAY 90 tablet 1  . glimepiride (AMARYL) 1 MG tablet Take 1 mg by mouth daily with breakfast.    . metoprolol tartrate (LOPRESSOR) 25 MG tablet Take 1 tablet (25 mg total) by mouth 2 (two) times daily. 180 tablet 1  . omega-3 acid ethyl esters (LOVAZA) 1 g capsule Take 1 g by mouth daily.    . sitaGLIPtin (JANUVIA) 50 MG tablet Take 50 mg by mouth daily.    Marland Kitchen tolterodine (DETROL LA) 2 MG 24 hr capsule Take 2 mg by mouth daily as needed for bladder spasms.    Marland Kitchen warfarin (COUMADIN) 5 MG tablet TAKE AS DIRECTED BY THE COUMADIN CLINIC 90 tablet 1  No current facility-administered medications for this visit.     Allergies:   Atorvastatin; Codeine; Morphine and related; Niacin; and Percocet [oxycodone-acetaminophen]   Social History:  The patient  reports that she quit smoking about 22 years ago. Her smoking use included Cigarettes. She has a 15.00 pack-year smoking history. She has never used smokeless tobacco. She reports that she does not drink alcohol or use drugs.   Family History:  The patient's family history includes COPD in her father; Cancer (age of onset: 9) in her mother; Coronary artery disease in her father;  Diabetes in her mother; Hypertension in her other.    ROS:  Please see the history of present illness.  Otherwise, review of systems is positive for seasonal allergies.  All other systems are reviewed and negative.    PHYSICAL EXAM: VS:  BP 130/70   Pulse 75   Ht 5\' 2"  (1.575 m)   Wt 81.7 kg (180 lb 1.9 oz)   BMI 32.94 kg/m  , BMI Body mass index is 32.94 kg/m. GEN: Well nourished, well developed, in no acute distress  HEENT: normal  Neck: no JVD, no masses. No carotid bruits Cardiac: RRR with 2/6 systolic murmur at the RUSB, crisp mechanical A2. No diastolic murmur              Respiratory:  clear to auscultation bilaterally, normal work of breathing GI: soft, nontender, nondistended, + BS MS: no deformity or atrophy  Ext: no pretibial edema, pedal pulses 2+= bilaterally Skin: warm and dry, no rash Neuro:  Strength and sensation are intact Psych: euthymic mood, full affect  EKG:  EKG is ordered today. The ekg ordered today shows NSR 75 bpm, cannot rule out age-indeterminate inferior infarct  Recent Labs: No results found for requested labs within last 8760 hours.   Lipid Panel     Component Value Date/Time   CHOL 163 03/03/2013 0732   TRIG 238.0 (H) 03/03/2013 0732   HDL 46.20 03/03/2013 0732   CHOLHDL 4 03/03/2013 0732   VLDL 47.6 (H) 03/03/2013 0732   LDLCALC 91 03/18/2011 0743   LDLDIRECT 93.3 03/03/2013 0732      Wt Readings from Last 3 Encounters:  05/14/17 81.7 kg (180 lb 1.9 oz)  10/25/16 81.8 kg (180 lb 6.4 oz)  05/06/16 78.1 kg (172 lb 1.9 oz)     Cardiac Studies Reviewed: 2D Echo 10-25-2016: Study Conclusions  - Left ventricle: The cavity size was normal. There was mild   concentric hypertrophy. Systolic function was normal. The   estimated ejection fraction was in the range of 60% to 65%. Wall   motion was normal; there were no regional wall motion   abnormalities. - Aortic valve: Poorly visualized. A mechanical prosthesis was   present. Mean  gradient (S): 37 mm Hg. Valve area (VTI): 1.15   cm^2. Valve area (Vmax): 1.08 cm^2. Valve area (Vmean): 1.19   cm^2. - Left atrium: The atrium was mildly dilated. - Impressions: Compared to echo of 2017, the mean AVG has increased   from 63mmHg to 70mmHg. The prosthesis is not well visualized.  Impressions:  - Compared to echo of 2017, the mean AVG has increased from 30mmHg   to 39mmHg. The prosthesis is not well visualized.  ASSESSMENT AND PLAN: Aortic valve disease with CCS 2 symptoms of chronic exertional angina related to her valve disease. The patient has moderate stenosis of her mechanical prosthesis. Plan repeat echo in 6 months and FU visit at that time. Limited options  for treatment if symptoms or gradients worsen. Would likely need redo sternotomy with aortic root enlargement. Hopefully will remain stable over time.   She is tolerating oral anticoagulation with warfarin and ASA 81 mg. Occasional epistaxis noted.   Current medicines are reviewed with the patient today.  The patient does not have concerns regarding medicines.  Labs/ tests ordered today include:   Orders Placed This Encounter  Procedures  . EKG 12-Lead  . ECHOCARDIOGRAM COMPLETE   Disposition:   FU 6 months  Signed, Sherren Mocha, MD  05/14/2017 5:33 PM    [facility] [address], [place], [place]  [address] Phone: [phone]; Fax: [phone]

## 2017-05-22 ENCOUNTER — Ambulatory Visit
Admission: RE | Admit: 2017-05-22 | Discharge: 2017-05-22 | Disposition: A | Discharge status: Home / Self Care | Payer: Commercial Managed Care - PPO | Source: Ambulatory Visit | Attending: Internal Medicine | Admitting: Internal Medicine

## 2017-05-22 DIAGNOSIS — Z1231 Encounter for screening mammogram for malignant neoplasm of breast: Secondary | ICD-10-CM

## 2017-06-11 ENCOUNTER — Other Ambulatory Visit: Payer: Self-pay | Admitting: Cardiovascular Disease

## 2017-06-12 ENCOUNTER — Ambulatory Visit (INDEPENDENT_AMBULATORY_CARE_PROVIDER_SITE_OTHER): Payer: Commercial Managed Care - PPO | Admitting: *Deleted

## 2017-06-12 DIAGNOSIS — Z952 Presence of prosthetic heart valve: Secondary | ICD-10-CM

## 2017-06-12 DIAGNOSIS — Z5181 Encounter for therapeutic drug level monitoring: Secondary | ICD-10-CM | POA: Diagnosis not present

## 2017-06-12 DIAGNOSIS — I359 Nonrheumatic aortic valve disorder, unspecified: Secondary | ICD-10-CM | POA: Diagnosis not present

## 2017-06-12 LAB — POCT INR: INR: 1.9

## 2017-06-13 DIAGNOSIS — E7800 Pure hypercholesterolemia, unspecified: Secondary | ICD-10-CM | POA: Diagnosis not present

## 2017-06-16 DIAGNOSIS — E119 Type 2 diabetes mellitus without complications: Secondary | ICD-10-CM | POA: Diagnosis not present

## 2017-06-16 DIAGNOSIS — E781 Pure hyperglyceridemia: Secondary | ICD-10-CM | POA: Diagnosis not present

## 2017-06-16 DIAGNOSIS — Z23 Encounter for immunization: Secondary | ICD-10-CM | POA: Diagnosis not present

## 2017-06-16 DIAGNOSIS — E7800 Pure hypercholesterolemia, unspecified: Secondary | ICD-10-CM | POA: Diagnosis not present

## 2017-07-25 ENCOUNTER — Ambulatory Visit (INDEPENDENT_AMBULATORY_CARE_PROVIDER_SITE_OTHER): Payer: Commercial Managed Care - PPO | Admitting: Pharmacist

## 2017-07-25 DIAGNOSIS — I359 Nonrheumatic aortic valve disorder, unspecified: Secondary | ICD-10-CM | POA: Diagnosis not present

## 2017-07-25 DIAGNOSIS — Z952 Presence of prosthetic heart valve: Secondary | ICD-10-CM

## 2017-07-25 DIAGNOSIS — Z5181 Encounter for therapeutic drug level monitoring: Secondary | ICD-10-CM

## 2017-07-25 LAB — POCT INR: INR: 2.2

## 2017-07-25 NOTE — Patient Instructions (Signed)
Description   Continue taking 1 tablet daily except 1/2 tablet on Sundays, Tuesdays, and Thursdays.  Recheck in 6 weeks.  [facility] [phone]    

## 2017-08-18 DIAGNOSIS — J069 Acute upper respiratory infection, unspecified: Secondary | ICD-10-CM | POA: Diagnosis not present

## 2017-08-18 DIAGNOSIS — J0100 Acute maxillary sinusitis, unspecified: Secondary | ICD-10-CM | POA: Diagnosis not present

## 2017-09-05 ENCOUNTER — Ambulatory Visit (INDEPENDENT_AMBULATORY_CARE_PROVIDER_SITE_OTHER): Payer: Commercial Managed Care - PPO | Admitting: Pharmacist

## 2017-09-05 DIAGNOSIS — Z5181 Encounter for therapeutic drug level monitoring: Secondary | ICD-10-CM | POA: Diagnosis not present

## 2017-09-05 DIAGNOSIS — Z952 Presence of prosthetic heart valve: Secondary | ICD-10-CM

## 2017-09-05 DIAGNOSIS — I359 Nonrheumatic aortic valve disorder, unspecified: Secondary | ICD-10-CM

## 2017-09-05 LAB — POCT INR: INR: 2.4

## 2017-09-05 NOTE — Patient Instructions (Signed)
Description   Continue taking 1 tablet daily except 1/2 tablet on Sundays, Tuesdays, and Thursdays.  Recheck in 6 weeks.  [facility] [phone]    

## 2017-10-17 ENCOUNTER — Ambulatory Visit (INDEPENDENT_AMBULATORY_CARE_PROVIDER_SITE_OTHER): Payer: Commercial Managed Care - PPO | Admitting: Pharmacist

## 2017-10-17 DIAGNOSIS — Z5181 Encounter for therapeutic drug level monitoring: Secondary | ICD-10-CM | POA: Diagnosis not present

## 2017-10-17 DIAGNOSIS — I359 Nonrheumatic aortic valve disorder, unspecified: Secondary | ICD-10-CM

## 2017-10-17 DIAGNOSIS — Z952 Presence of prosthetic heart valve: Secondary | ICD-10-CM | POA: Diagnosis not present

## 2017-10-17 LAB — POCT INR: INR: 2.4

## 2017-10-17 NOTE — Patient Instructions (Signed)
Description   Continue taking 1 tablet daily except 1/2 tablet on Sundays, Tuesdays, and Thursdays.  Recheck in 6 weeks.  [facility] [phone]    

## 2017-11-20 ENCOUNTER — Other Ambulatory Visit (HOSPITAL_COMMUNITY): Payer: Commercial Managed Care - PPO

## 2017-11-20 ENCOUNTER — Ambulatory Visit: Payer: Commercial Managed Care - PPO | Admitting: Cardiovascular Disease

## 2017-11-26 ENCOUNTER — Ambulatory Visit (INDEPENDENT_AMBULATORY_CARE_PROVIDER_SITE_OTHER): Payer: Commercial Managed Care - PPO | Admitting: Pharmacist

## 2017-11-26 DIAGNOSIS — I359 Nonrheumatic aortic valve disorder, unspecified: Secondary | ICD-10-CM | POA: Diagnosis not present

## 2017-11-26 DIAGNOSIS — Z952 Presence of prosthetic heart valve: Secondary | ICD-10-CM | POA: Diagnosis not present

## 2017-11-26 DIAGNOSIS — Z5181 Encounter for therapeutic drug level monitoring: Secondary | ICD-10-CM | POA: Diagnosis not present

## 2017-11-26 LAB — POCT INR: INR: 2.7

## 2017-11-26 NOTE — Patient Instructions (Signed)
Description   Continue taking 1 tablet daily except 1/2 tablet on Sundays, Tuesdays, and Thursdays.  Recheck in 6 weeks.  [facility] [phone]    

## 2017-12-15 DIAGNOSIS — Z952 Presence of prosthetic heart valve: Secondary | ICD-10-CM | POA: Diagnosis not present

## 2017-12-15 DIAGNOSIS — E7800 Pure hypercholesterolemia, unspecified: Secondary | ICD-10-CM | POA: Diagnosis not present

## 2017-12-15 DIAGNOSIS — E119 Type 2 diabetes mellitus without complications: Secondary | ICD-10-CM | POA: Diagnosis not present

## 2017-12-17 ENCOUNTER — Other Ambulatory Visit: Payer: Self-pay | Admitting: Cardiovascular Disease

## 2017-12-19 DIAGNOSIS — H6123 Impacted cerumen, bilateral: Secondary | ICD-10-CM | POA: Diagnosis not present

## 2018-01-12 ENCOUNTER — Ambulatory Visit: Payer: Commercial Managed Care - PPO | Admitting: Cardiovascular Disease

## 2018-01-12 ENCOUNTER — Encounter: Payer: Self-pay | Admitting: Cardiovascular Disease

## 2018-01-12 ENCOUNTER — Ambulatory Visit (HOSPITAL_COMMUNITY): Payer: Commercial Managed Care - PPO | Attending: Cardiovascular Disease

## 2018-01-12 ENCOUNTER — Other Ambulatory Visit: Payer: Self-pay

## 2018-01-12 ENCOUNTER — Ambulatory Visit (INDEPENDENT_AMBULATORY_CARE_PROVIDER_SITE_OTHER): Payer: Commercial Managed Care - PPO | Admitting: *Deleted

## 2018-01-12 VITALS — BP 114/64 | HR 66 | Ht 61.6 in | Wt 185.0 lb

## 2018-01-12 DIAGNOSIS — Z952 Presence of prosthetic heart valve: Secondary | ICD-10-CM | POA: Insufficient documentation

## 2018-01-12 DIAGNOSIS — I359 Nonrheumatic aortic valve disorder, unspecified: Secondary | ICD-10-CM

## 2018-01-12 DIAGNOSIS — E119 Type 2 diabetes mellitus without complications: Secondary | ICD-10-CM | POA: Insufficient documentation

## 2018-01-12 DIAGNOSIS — Z5181 Encounter for therapeutic drug level monitoring: Secondary | ICD-10-CM

## 2018-01-12 DIAGNOSIS — I10 Essential (primary) hypertension: Secondary | ICD-10-CM | POA: Insufficient documentation

## 2018-01-12 DIAGNOSIS — I071 Rheumatic tricuspid insufficiency: Secondary | ICD-10-CM | POA: Diagnosis not present

## 2018-01-12 DIAGNOSIS — Z87891 Personal history of nicotine dependence: Secondary | ICD-10-CM | POA: Diagnosis not present

## 2018-01-12 DIAGNOSIS — E785 Hyperlipidemia, unspecified: Secondary | ICD-10-CM | POA: Insufficient documentation

## 2018-01-12 LAB — BASIC METABOLIC PANEL
BUN / CREAT RATIO: 15 (ref 12–28)
BUN: 14 mg/dL (ref 8–27)
CO2: 23 mmol/L (ref 20–29)
CREATININE: 0.94 mg/dL (ref 0.57–1.00)
Calcium: 9.7 mg/dL (ref 8.7–10.3)
Chloride: 102 mmol/L (ref 96–106)
GFR calc Af Amer: 76 mL/min/{1.73_m2} (ref >59)
GFR, EST NON AFRICAN AMERICAN: 66 mL/min/{1.73_m2} (ref >59)
GLUCOSE: 120 mg/dL — AB (ref 65–99)
Potassium: 4.1 mmol/L (ref 3.5–5.2)
SODIUM: 142 mmol/L (ref 134–144)

## 2018-01-12 LAB — POCT INR: INR: 2.8 (ref 2.0–3.0)

## 2018-01-12 NOTE — Patient Instructions (Signed)
Description   Continue taking 1 tablet daily except 1/2 tablet on Sundays, Tuesdays, and Thursdays.  Recheck in 6 weeks.  [facility] [phone]    

## 2018-01-12 NOTE — Patient Instructions (Addendum)
Medication Instructions:  Your provider recommends that you continue on your current medications as directed. Please refer to the Current Medication list given to you today.    Labwork: None  Testing/Procedures: Dr. Burt Knack recommends you have a CARDIAC CT to better look at your aortic valve.  Your provider has requested that you have an echocardiogram in 6 months. Echocardiography is a painless test that uses sound waves to create images of your heart. It provides your doctor with information about the size and shape of your heart and how well your heart's chambers and valves are working. This procedure takes approximately one hour. There are no restrictions for this procedure.  Follow-Up: Your provider wants you to follow-up in: 6 months with Dr. Burt Knack. You will receive a reminder letter in the mail two months in advance. If you don't receive a letter, please call our office to schedule the follow-up appointment.    Any Other Special Instructions Will Be Listed Below (If Applicable).  Please follow these instructions carefully for your CORONARY CT Please arrive at the [facility] main entrance of [place] will be called with appointment date and time.  [facility] [place], [place] [address] [phone]  Proceed to the [facility] Radiology Department (First Floor):  On the Night Before the Test: . Drink plenty of water. . Do not consume any caffeinated/decaffeinated beverages or chocolate 12 hours prior to your test. . Do not take any antihistamines 12 hours prior to your test.  On the Day of the Test: . Drink plenty of water. Do not drink any water within one hour of the test. . Do not eat any food 4 hours prior to the test. . You may take your regular medications prior to the test. Make sure to take your Lopressor.  After the Test: . Drink plenty of water. . After receiving IV contrast, you may experience a mild flushed feeling.  This is normal. . On occasion, you may experience a mild rash up to 24 hours after the test. This is not dangerous. If this occurs, you can take Benadryl 25 mg and increase your fluid intake. . If you experience trouble breathing, this can be serious. If it is severe call 911 IMMEDIATELY. If it is mild, please call our office. . If you take any of these medications: Glipizide/Metformin, Avandament, Glucavance, please do not take 48 hours after completing test.     If you need a refill on your cardiac medications before your next appointment, please call your pharmacy.

## 2018-01-12 NOTE — Progress Notes (Signed)
Cardiology Office Note Date:  01/12/2018   ID:  [name], DOB 27-Oct-1955, MRN [id number]  PCP:  Lanice Shirts, MD  Cardiologist:  Sherren Mocha, MD    Chief Complaint  Patient presents with  . Shortness of Breath   History of Present Illness: [name] is a 62 y.o. female who presents for  follow-up of aortic valve disease.   The patient has a history of severe bicuspid valve aortic stenosis and she underwent mechanical aortic valve replacement in 2012 with a 19 mm mechanical bileaflet valve. Postoperatively she has been noted to have elevated transvalvular gradients. She's undergone transesophageal echo demonstrating no clear evidence of leaflet dysfunction. Mechanism has been felt to be related to her small aortic prosthesis and vigorous LV function (patient-prosthesis mismatch).   She is here alone today. She had an echo this morning. She is short of breath with stairs or brisk walking. She is able to walk at a normal pace on level ground without symptoms. Also describes some chest tightness at night. These symptoms are like those that pre-dated aortic valve replacement. She is able to garden, carry her grandchildren, and do other physical activities with minimal symptoms. No recent lightheadedness or near-syncope.  She has been doing well on a combination of warfarin and aspirin with no recurrent epistaxis.   Past Medical History:  Diagnosis Date  . Angina    chest tightness and pressure due to aortic stenosis  . Aortic stenosis   . Diabetes mellitus    newly diagnosed in july, 2012  . GERD (gastroesophageal reflux disease)   . Headache(784.0)    migraines when younger  . Heart murmur    aortic stenosis - dr. Burt Knack is her cardiologist  . Hx of hysterectomy   . Hyperlipidemia   . Multinodular thyroid   . Palpitation   . PONV (postoperative nausea and vomiting)   . Shortness of breath    associated with as    Past Surgical History:  Procedure  Laterality Date  . ABDOMINAL HYSTERECTOMY    . AORTIC VALVE REPLACEMENT  06/25/2011   Procedure: AORTIC VALVE REPLACEMENT (AVR);  Surgeon: Tharon Aquas Adelene Idler, MD;  Location: [place];  Service: Open Heart Surgery;  Laterality: N/A;  . BUNIONECTOMY    . CARDIAC VALVE REPLACEMENT     Aortic Valve   06/25/2011  . CESAREAN SECTION    . CYSTOSCOPY WITH RETROGRADE PYELOGRAM, URETEROSCOPY AND STENT PLACEMENT Left 10/21/2014   Procedure: CYSTOSCOPY WITH LEFT RETROGRADE PYELOGRAM, URETEROSCOPY BIOPSY AND STENT PLACEMENT;  Surgeon: Alexis Frock, MD;  Location: [facility];  Service: Urology;  Laterality: Left;  . PARTIAL HYSTERECTOMY    . TEE WITHOUT CARDIOVERSION N/A 09/18/2012   Procedure: TRANSESOPHAGEAL ECHOCARDIOGRAM (TEE);  Surgeon: Fay Records, MD;  Location: [facility] ENDOSCOPY;  Service: Cardiovascular;  Laterality: N/A;    Current Outpatient Medications  Medication Sig Dispense Refill  . aspirin 81 MG tablet Take 81 mg by mouth every morning.     Marland Kitchen CRESTOR 20 MG tablet TAKE 1 TABLET BY MOUTH ONCE A DAY 90 tablet 1  . glimepiride (AMARYL) 1 MG tablet Take 1 mg by mouth 2 (two) times daily.     Marland Kitchen JANUVIA 100 MG tablet Take 1 tablet by mouth daily.    . metoprolol tartrate (LOPRESSOR) 25 MG tablet Take 1 tablet (25 mg total) by mouth 2 (two) times daily. 180 tablet 3  . omega-3 acid ethyl esters (LOVAZA) 1 g capsule Take 1 g by  mouth daily.    Marland Kitchen warfarin (COUMADIN) 5 MG tablet TAKE AS DIRECTED BY THE COUMADIN CLINIC 90 tablet 1   No current facility-administered medications for this visit.     Allergies:   Atorvastatin; Codeine; Morphine and related; Niacin; and Percocet [oxycodone-acetaminophen]   Social History:  The patient  reports that she quit smoking about 23 years ago. Her smoking use included cigarettes. She has a 15.00 pack-year smoking history. She has never used smokeless tobacco. She reports that she does not drink alcohol or use drugs.   Family History:  The patient's  family history  includes Breast cancer in her paternal aunt; COPD in her father; Cancer (age of onset: 11) in her mother; Coronary artery disease in her father; Diabetes in her mother; Hypertension in her other.    ROS:  Please see the history of present illness.  Otherwise, review of systems is positive for fatigue, heat intolerance.  All other systems are reviewed and negative.    PHYSICAL EXAM: VS:  BP 114/64   Pulse 66   Ht 5' 1.6" (1.565 m)   Wt 185 lb (83.9 kg)   SpO2 97%   BMI 34.28 kg/m  , BMI Body mass index is 34.28 kg/m. GEN: Well nourished, well developed, in no acute distress  HEENT: normal  Neck: no JVD, no masses.  Bilateral carotid bruits  cardiac:  RRR with grade 3/6 systolic murmur, mechanical A2 present Respiratory:  clear to auscultation bilaterally, normal work of breathing GI: soft, nontender, nondistended, + BS MS: no deformity or atrophy  Ext: no pretibial edema, pedal pulses 2+= bilaterally Skin: warm and dry, no rash Neuro:  Strength and sensation are intact Psych: euthymic mood, full affect  EKG:  EKG is not ordered today.  Recent Labs: No results found for requested labs within last 8760 hours.   Lipid Panel     Component Value Date/Time   CHOL 163 03/03/2013 0732   TRIG 238.0 (H) 03/03/2013 0732   HDL 46.20 03/03/2013 0732   CHOLHDL 4 03/03/2013 0732   VLDL 47.6 (H) 03/03/2013 0732   LDLCALC 91 03/18/2011 0743   LDLDIRECT 93.3 03/03/2013 0732      Wt Readings from Last 3 Encounters:  01/12/18 185 lb (83.9 kg)  05/14/17 180 lb 1.9 oz (81.7 kg)  10/25/16 180 lb 6.4 oz (81.8 kg)     Cardiac Studies Reviewed Today's echo study is currently pending, but images are reviewed.  LV systolic function is vigorous with an LVEF greater than 65%.  The patient's mechanical aortic valve demonstrates severe stenosis with peak and mean gradients of 81 and 43 mmHg and a peak systolic velocity of 4.5 m/s.  There is no aortic regurgitation.  ASSESSMENT AND PLAN: Aortic  valve disease with mechanical AVR, prosthetic valve stenosis. Serial echo studies demonstrate mean gradients 34--->37--->43 on today's study. She continues on warfarin and ASA.  She has New York Heart Association functional class II symptoms with some progression over the last 6 months.  I am going to check a gated cardiac CTA to evaluate leaflet mobility and other potential etiologies of progressive mechanical aortic valve stenosis.  I am going to review her case at our multidisciplinary valve clinic meeting.  I counseled her regarding the treatment options at length today.  I suspect she is going to need a redo aortic valve replacement.  We discussed potential valve choices including consideration of aortic root enlargement/bioprosthetic valve replacement.  I will be in touch with her after we  discuss her case at our multidisciplinary team meeting.  She will continue on aspirin and warfarin.  Current medicines are reviewed with the patient today.  The patient does not have concerns regarding medicines.  Labs/ tests ordered today include:   Orders Placed This Encounter  Procedures  . CT CORONARY MORPH W/CTA COR W/SCORE W/CA W/CM &/OR WO/CM  . Basic metabolic panel  . ECHOCARDIOGRAM COMPLETE    Disposition:   FU 6 months or sooner pending test results  Signed, Sherren Mocha, MD  01/12/2018 1:38 PM    [facility] [address], [place], [place]  [address] Phone: [phone]; Fax: [phone]

## 2018-01-21 DIAGNOSIS — E7800 Pure hypercholesterolemia, unspecified: Secondary | ICD-10-CM | POA: Diagnosis not present

## 2018-01-21 DIAGNOSIS — E781 Pure hyperglyceridemia: Secondary | ICD-10-CM | POA: Diagnosis not present

## 2018-01-21 DIAGNOSIS — E1169 Type 2 diabetes mellitus with other specified complication: Secondary | ICD-10-CM | POA: Diagnosis not present

## 2018-01-22 ENCOUNTER — Ambulatory Visit (HOSPITAL_COMMUNITY)
Admission: RE | Admit: 2018-01-22 | Discharge: 2018-01-22 | Disposition: A | Discharge status: Home / Self Care | Payer: Commercial Managed Care - PPO | Source: Ambulatory Visit | Attending: Cardiovascular Disease | Admitting: Cardiovascular Disease

## 2018-01-22 DIAGNOSIS — I359 Nonrheumatic aortic valve disorder, unspecified: Secondary | ICD-10-CM

## 2018-01-22 DIAGNOSIS — Z952 Presence of prosthetic heart valve: Secondary | ICD-10-CM | POA: Insufficient documentation

## 2018-01-22 DIAGNOSIS — I350 Nonrheumatic aortic (valve) stenosis: Secondary | ICD-10-CM | POA: Diagnosis not present

## 2018-01-22 MED ORDER — IOPAMIDOL (ISOVUE-370) INJECTION 76%
100.0000 mL | Freq: Once | INTRAVENOUS | Status: AC | PRN
  Administered 2018-01-22: 100 mL via INTRAVENOUS

## 2018-01-22 MED ORDER — IOPAMIDOL (ISOVUE-370) INJECTION 76%
INTRAVENOUS | Status: AC
  Filled 2018-01-22: qty 100

## 2018-02-23 ENCOUNTER — Ambulatory Visit (INDEPENDENT_AMBULATORY_CARE_PROVIDER_SITE_OTHER): Payer: Commercial Managed Care - PPO | Admitting: *Deleted

## 2018-02-23 DIAGNOSIS — Z952 Presence of prosthetic heart valve: Secondary | ICD-10-CM | POA: Diagnosis not present

## 2018-02-23 DIAGNOSIS — I359 Nonrheumatic aortic valve disorder, unspecified: Secondary | ICD-10-CM | POA: Diagnosis not present

## 2018-02-23 DIAGNOSIS — Z5181 Encounter for therapeutic drug level monitoring: Secondary | ICD-10-CM

## 2018-02-23 LAB — POCT INR: INR: 2.7 (ref 2.0–3.0)

## 2018-02-23 NOTE — Patient Instructions (Signed)
Description   Continue taking 1 tablet daily except 1/2 tablet on Sundays, Tuesdays, and Thursdays.  Recheck in 6 weeks.  [facility] [phone]    

## 2018-04-06 ENCOUNTER — Ambulatory Visit (INDEPENDENT_AMBULATORY_CARE_PROVIDER_SITE_OTHER): Payer: Commercial Managed Care - PPO

## 2018-04-06 DIAGNOSIS — Z952 Presence of prosthetic heart valve: Secondary | ICD-10-CM | POA: Diagnosis not present

## 2018-04-06 DIAGNOSIS — Z5181 Encounter for therapeutic drug level monitoring: Secondary | ICD-10-CM

## 2018-04-06 DIAGNOSIS — I359 Nonrheumatic aortic valve disorder, unspecified: Secondary | ICD-10-CM | POA: Diagnosis not present

## 2018-04-06 LAB — POCT INR: INR: 3.1 — AB (ref 2.0–3.0)

## 2018-04-06 NOTE — Patient Instructions (Signed)
Description   Skip today's dosage of Coumadin, then resume same dosage 1 tablet daily except 1/2 tablet on Sundays, Tuesdays, and Thursdays.  Recheck in 5 weeks.  Coumadin Clinic [phone]

## 2018-04-24 ENCOUNTER — Other Ambulatory Visit: Payer: Self-pay | Admitting: Internal Medicine

## 2018-04-24 DIAGNOSIS — Z1231 Encounter for screening mammogram for malignant neoplasm of breast: Secondary | ICD-10-CM

## 2018-05-11 ENCOUNTER — Ambulatory Visit (INDEPENDENT_AMBULATORY_CARE_PROVIDER_SITE_OTHER): Payer: Commercial Managed Care - PPO | Admitting: *Deleted

## 2018-05-11 DIAGNOSIS — I359 Nonrheumatic aortic valve disorder, unspecified: Secondary | ICD-10-CM | POA: Diagnosis not present

## 2018-05-11 DIAGNOSIS — Z952 Presence of prosthetic heart valve: Secondary | ICD-10-CM

## 2018-05-11 DIAGNOSIS — R319 Hematuria, unspecified: Secondary | ICD-10-CM | POA: Diagnosis not present

## 2018-05-11 DIAGNOSIS — Z5181 Encounter for therapeutic drug level monitoring: Secondary | ICD-10-CM

## 2018-05-11 DIAGNOSIS — N390 Urinary tract infection, site not specified: Secondary | ICD-10-CM | POA: Diagnosis not present

## 2018-05-11 LAB — POCT INR: INR: 2.8 (ref 2.0–3.0)

## 2018-05-11 NOTE — Patient Instructions (Signed)
Description   Continue taking 1 tablet daily except 1/2 tablet on Sundays, Tuesdays, and Thursdays.  Recheck in 6 weeks.  [facility] [phone]    

## 2018-05-26 ENCOUNTER — Ambulatory Visit
Admission: RE | Admit: 2018-05-26 | Discharge: 2018-05-26 | Disposition: A | Discharge status: Home / Self Care | Payer: Commercial Managed Care - PPO | Source: Ambulatory Visit | Attending: Internal Medicine | Admitting: Internal Medicine

## 2018-05-26 DIAGNOSIS — Z1231 Encounter for screening mammogram for malignant neoplasm of breast: Secondary | ICD-10-CM | POA: Diagnosis not present

## 2018-06-08 ENCOUNTER — Other Ambulatory Visit: Payer: Self-pay | Admitting: *Deleted

## 2018-06-08 MED ORDER — METOPROLOL TARTRATE 25 MG PO TABS: 25.0000 mg | ORAL_TABLET | Freq: Two times a day (BID) | ORAL | 0 refills | Status: DC

## 2018-06-11 DIAGNOSIS — E7800 Pure hypercholesterolemia, unspecified: Secondary | ICD-10-CM | POA: Diagnosis not present

## 2018-06-11 DIAGNOSIS — Z0000 Encounter for general adult medical examination without abnormal findings: Secondary | ICD-10-CM | POA: Diagnosis not present

## 2018-06-11 DIAGNOSIS — E559 Vitamin D deficiency, unspecified: Secondary | ICD-10-CM | POA: Diagnosis not present

## 2018-06-11 DIAGNOSIS — E1169 Type 2 diabetes mellitus with other specified complication: Secondary | ICD-10-CM | POA: Diagnosis not present

## 2018-06-11 DIAGNOSIS — Z952 Presence of prosthetic heart valve: Secondary | ICD-10-CM | POA: Diagnosis not present

## 2018-06-29 ENCOUNTER — Ambulatory Visit (INDEPENDENT_AMBULATORY_CARE_PROVIDER_SITE_OTHER): Payer: Commercial Managed Care - PPO | Admitting: *Deleted

## 2018-06-29 DIAGNOSIS — Z5181 Encounter for therapeutic drug level monitoring: Secondary | ICD-10-CM | POA: Diagnosis not present

## 2018-06-29 DIAGNOSIS — Z952 Presence of prosthetic heart valve: Secondary | ICD-10-CM | POA: Diagnosis not present

## 2018-06-29 DIAGNOSIS — I359 Nonrheumatic aortic valve disorder, unspecified: Secondary | ICD-10-CM | POA: Diagnosis not present

## 2018-06-29 LAB — POCT INR: INR: 2.2 (ref 2.0–3.0)

## 2018-06-29 NOTE — Patient Instructions (Signed)
Description   Continue taking 1 tablet daily except 1/2 tablet on Sundays, Tuesdays, and Thursdays.  Recheck in 6 weeks.  [facility] [phone]    

## 2018-07-15 ENCOUNTER — Ambulatory Visit (HOSPITAL_COMMUNITY): Payer: Commercial Managed Care - PPO | Attending: Cardiology

## 2018-07-15 ENCOUNTER — Ambulatory Visit (INDEPENDENT_AMBULATORY_CARE_PROVIDER_SITE_OTHER): Payer: Commercial Managed Care - PPO | Admitting: Cardiovascular Disease

## 2018-07-15 ENCOUNTER — Encounter: Payer: Self-pay | Admitting: Cardiovascular Disease

## 2018-07-15 VITALS — BP 118/62 | HR 50 | Ht 61.6 in | Wt 185.8 lb

## 2018-07-15 DIAGNOSIS — I359 Nonrheumatic aortic valve disorder, unspecified: Secondary | ICD-10-CM | POA: Diagnosis present

## 2018-07-15 MED ORDER — PERFLUTREN LIPID MICROSPHERE
1.0000 mL | INTRAVENOUS | Status: AC | PRN
Start: 2018-07-15 — End: 2018-07-15
  Administered 2018-07-15: 2 mL via INTRAVENOUS

## 2018-07-15 NOTE — Patient Instructions (Signed)
Medication Instructions:  No change If you need a refill on your cardiac medications before your next appointment, please call your pharmacy.   Lab work: none If you have labs (blood work) drawn today and your tests are completely normal, you will receive your results only by: Marland Kitchen MyChart Message (if you have MyChart) OR . A paper copy in the mail If you have any lab test that is abnormal or we need to change your treatment, we will call you to review the results.  Testing/Procedures: none  Follow-Up: At [facility], you and your health needs are our priority.  As part of our continuing mission to provide you with exceptional heart care, we have created designated Provider Care Teams.  These Care Teams include your primary Cardiologist (physician) and Advanced Practice Providers (APPs -  Physician Assistants and Nurse Practitioners) who all work together to provide you with the care you need, when you need it. You will need a follow up appointment in:  6 months.  Please call our office 2 months in advance to schedule this appointment.  You may see Sherren Mocha, MD or one of the following Advanced Practice Providers on your designated Care Team: Richardson Dopp, PA-C [place], [place] . Daune Perch, NP  Any Other Special Instructions Will Be Listed Below (If Applicable).

## 2018-07-15 NOTE — Progress Notes (Addendum)
Cardiology Office Note:    Date:  07/15/2018   ID:  [name], DOB 1956-04-09, MRN [id number]  PCP:  Lanice Shirts, MD  Cardiologist:  Sherren Mocha, MD  Electrophysiologist:  None   Referring MD: Lanice Shirts, *   Chief Complaint  Patient presents with  . Follow-up    aortic valve disease    History of Present Illness:    [name] is a 62 y.o. female with a hx of aortic valve disease presented for follow-up evaluation.  The patient has a history of severe bicuspid aortic valve stenosis and she underwent mechanical aortic valve replacement in 2012 with a 19 mm mechanical bileaflet valve.  She is had the elevated transvalvular gradient since surgery and has undergone extensive evaluation including transesophageal echo and CTA studies to evaluate for any leaflet dysfunction.  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  Her last echocardiogram demonstrated a mean transaortic valve gradient of 43 mmHg.  The patient is here alone today.  She is feeling quite well.  She is having no further symptoms of lightheadedness or chest pressure.  She denies shortness of breath.  Overall she feels well and has no specific complaints.  She denies any bleeding problems on long-term warfarin and aspirin.  Past Medical History:  Diagnosis Date  . Angina    chest tightness and pressure due to aortic stenosis  . Aortic stenosis   . Diabetes mellitus    newly diagnosed in july, 2012  . GERD (gastroesophageal reflux disease)   . Headache(784.0)    migraines when younger  . Heart murmur    aortic stenosis - dr. Burt Knack is her cardiologist  . Hx of hysterectomy   . Hyperlipidemia   . Multinodular thyroid   . Palpitation   . PONV (postoperative nausea and vomiting)   . Shortness of breath    associated with as    Past Surgical History:  Procedure Laterality Date  . ABDOMINAL HYSTERECTOMY    . AORTIC VALVE REPLACEMENT   06/25/2011   Procedure: AORTIC VALVE REPLACEMENT (AVR);  Surgeon: Tharon Aquas Adelene Idler, MD;  Location: [place];  Service: Open Heart Surgery;  Laterality: N/A;  . BREAST CYST ASPIRATION Left   . BUNIONECTOMY    . CARDIAC VALVE REPLACEMENT     Aortic Valve   06/25/2011  . CESAREAN SECTION    . CYSTOSCOPY WITH RETROGRADE PYELOGRAM, URETEROSCOPY AND STENT PLACEMENT Left 10/21/2014   Procedure: CYSTOSCOPY WITH LEFT RETROGRADE PYELOGRAM, URETEROSCOPY BIOPSY AND STENT PLACEMENT;  Surgeon: Alexis Frock, MD;  Location: [facility];  Service: Urology;  Laterality: Left;  . PARTIAL HYSTERECTOMY    . TEE WITHOUT CARDIOVERSION N/A 09/18/2012   Procedure: TRANSESOPHAGEAL ECHOCARDIOGRAM (TEE);  Surgeon: Fay Records, MD;  Location: [facility];  Service: Cardiovascular;  Laterality: N/A;    Current Medications: Current Meds  Medication Sig  . aspirin 81 MG tablet Take 81 mg by mouth every morning.   Marland Kitchen CRESTOR 20 MG tablet TAKE 1 TABLET BY MOUTH ONCE A DAY  . glimepiride (AMARYL) 1 MG tablet Take 1 mg by mouth 2 (two) times daily.   . metoprolol tartrate (LOPRESSOR) 25 MG tablet Take 1 tablet (25 mg total) by mouth 2 (two) times daily.  Marland Kitchen omega-3 acid ethyl esters (LOVAZA) 1 g capsule Take 1 g by mouth daily.  Marland Kitchen warfarin (COUMADIN) 5 MG tablet TAKE AS DIRECTED BY THE COUMADIN CLINIC     Allergies:  Atorvastatin; Codeine; Morphine and related; Niacin; and Percocet [oxycodone-acetaminophen]   Social History   Socioeconomic History  . Marital status: Married    Spouse name: Not on file  . Number of children: Not on file  . Years of education: Not on file  . Highest education level: Not on file  Occupational History  . Not on file  Social Needs  . Financial resource strain: Not on file  . Food insecurity:    Worry: Not on file    Inability: Not on file  . Transportation needs:    Medical: Not on file    Non-medical: Not on file  Tobacco Use  . Smoking status: Former Smoker    Packs/day: 1.00     Years: 15.00    Pack years: 15.00    Types: Cigarettes    Last attempt to quit: 08/12/1994    Years since quitting: 23.9  . Smokeless tobacco: Never Used  Substance and Sexual Activity  . Alcohol use: No  . Drug use: No  . Sexual activity: Not on file  Lifestyle  . Physical activity:    Days per week: Not on file    Minutes per session: Not on file  . Stress: Not on file  Relationships  . Social connections:    Talks on phone: Not on file    Gets together: Not on file    Attends religious service: Not on file    Active member of club or organization: Not on file    Attends meetings of clubs or organizations: Not on file    Relationship status: Not on file  Other Topics Concern  . Not on file  Social History Narrative   Married - one daughter 45 y/o   Occupation: Employed as Banker for [employer] History: The patient's family history includes Breast cancer in her paternal aunt; COPD in her father; Cancer (age of onset: 9) in her mother; Coronary artery disease in her father; Diabetes in her mother; Hypertension in her other.  ROS:   Please see the history of present illness.    All other systems reviewed and are negative.  EKGs/Labs/Other Studies Reviewed:    The following studies were reviewed today: CTA Heart 01-22-2018: FINDINGS: Aortic Valve: A bileaflet 19 mm mechanical valve sits well in the aortic position. The leaflets have full range unrestricted motion and there is no obvious pannus or a thrombus seen on the leaflets. There is no LVOT obstruction in systole and no subvalvular membrane.  Aorta: Normal size, no calcifications, no dissection.  Sinotubular Junction: 24 x 23 mm  Ascending Thoracic Aorta: 31 x 30 mm  Aortic Arch: 22 x 20 mm  Descending Thoracic Aorta: 19 x 18 mm  IMPRESSION: 1. A bileaflet 19 mm mechanical valve sits well in the aortic position. The leaflets have full range of motion and there is no obvious pannus or a thrombus  seen on the leaflets. There is no LVOT obstruction in systole and no subvalvular membrane.  EKG:  EKG is ordered today.  EKG demonstrates sinus bradycardia 50 bpm, nonspecific T wave abnormality.  Recent Labs: 01/12/2018: BUN 14; Creatinine, Ser 0.94; Potassium 4.1; Sodium 142  Recent Lipid Panel    Component Value Date/Time   CHOL 163 03/03/2013 0732   TRIG 238.0 (H) 03/03/2013 0732   HDL 46.20 03/03/2013 0732   CHOLHDL 4 03/03/2013 0732   VLDL 47.6 (H) 03/03/2013 0732   LDLCALC 91 03/18/2011 0743   LDLDIRECT 93.3  03/03/2013 0732    Physical Exam:    VS:  BP 118/62   Pulse (!) 50   Ht 5' 1.6" (1.565 m)   Wt 185 lb 12.8 oz (84.3 kg)   SpO2 98%   BMI 34.43 kg/m     Wt Readings from Last 3 Encounters:  07/15/18 185 lb 12.8 oz (84.3 kg)  01/12/18 185 lb (83.9 kg)  05/14/17 180 lb 1.9 oz (81.7 kg)     GEN:  Well nourished, well developed in no acute distress HEENT: Normal NECK: No JVD; No carotid bruits LYMPHATICS: No lymphadenopathy CARDIAC: RRR, grade 2/6 systolic ejection murmur with normal mechanical A2 RESPIRATORY:  Clear to auscultation without rales, wheezing or rhonchi  ABDOMEN: Soft, non-tender, non-distended MUSCULOSKELETAL:  No edema; No deformity  SKIN: Warm and dry NEUROLOGIC:  Alert and oriented x 3 PSYCHIATRIC:  Normal affect   ASSESSMENT:    1. Aortic valve disorder    PLAN:    In order of problems listed above:  1. The patient appears to be stable.  She is tolerating oral anticoagulation and daily antiplatelet therapy without significant bleeding problems.  She understands the need for SBE prophylaxis.  Today's echo study is personally reviewed and it demonstrates some improvement in her transvalvular gradients.  I reviewed the study and her mean gradients are measured between 30 and 33 mmHg.  She does have vigorous LV systolic function with LVEF that I would estimate greater than 65%.  Her CTA study which was recently performed shows normal motion of  her bileaflet valve with no pannus formation.  No medication changes are recommended today.  She will follow-up in 6 months.  I will plan to repeat an echocardiogram in 1 year.  We discussed the importance of continued efforts at weight loss and exercise.   Medication Adjustments/Labs and Tests Ordered: Current medicines are reviewed at length with the patient today.  Concerns regarding medicines are outlined above.  Orders Placed This Encounter  Procedures  . EKG 12-Lead   No orders of the defined types were placed in this encounter.   Patient Instructions  Medication Instructions:  No change If you need a refill on your cardiac medications before your next appointment, please call your pharmacy.   Lab work: none If you have labs (blood work) drawn today and your tests are completely normal, you will receive your results only by: Marland Kitchen MyChart Message (if you have MyChart) OR . A paper copy in the mail If you have any lab test that is abnormal or we need to change your treatment, we will call you to review the results.  Testing/Procedures: none  Follow-Up: At [facility], you and your health needs are our priority.  As part of our continuing mission to provide you with exceptional heart care, we have created designated Provider Care Teams.  These Care Teams include your primary Cardiologist (physician) and Advanced Practice Providers (APPs -  Physician Assistants and Nurse Practitioners) who all work together to provide you with the care you need, when you need it. You will need a follow up appointment in:  6 months.  Please call our office 2 months in advance to schedule this appointment.  You may see Sherren Mocha, MD or one of the following Advanced Practice Providers on your designated Care Team: Richardson Dopp, PA-C [place], [place] . Daune Perch, NP  Any Other Special Instructions Will Be Listed Below (If Applicable).       Signed, Sherren Mocha, MD  07/15/2018  1:07  PM    [facility]

## 2018-08-03 ENCOUNTER — Other Ambulatory Visit: Payer: Self-pay | Admitting: Cardiovascular Disease

## 2018-08-17 ENCOUNTER — Ambulatory Visit (INDEPENDENT_AMBULATORY_CARE_PROVIDER_SITE_OTHER): Payer: Commercial Managed Care - PPO | Admitting: Pharmacist

## 2018-08-17 DIAGNOSIS — Z952 Presence of prosthetic heart valve: Secondary | ICD-10-CM | POA: Diagnosis not present

## 2018-08-17 DIAGNOSIS — Z5181 Encounter for therapeutic drug level monitoring: Secondary | ICD-10-CM | POA: Diagnosis not present

## 2018-08-17 DIAGNOSIS — I359 Nonrheumatic aortic valve disorder, unspecified: Secondary | ICD-10-CM

## 2018-08-17 LAB — POCT INR: INR: 2 (ref 2.0–3.0)

## 2018-08-17 NOTE — Patient Instructions (Signed)
Description   Continue taking 1 tablet daily except 1/2 tablet on Sundays, Tuesdays, and Thursdays.  Recheck in 6 weeks.  [facility] [phone]    

## 2018-09-09 DIAGNOSIS — R634 Abnormal weight loss: Secondary | ICD-10-CM | POA: Diagnosis not present

## 2018-09-09 DIAGNOSIS — E1169 Type 2 diabetes mellitus with other specified complication: Secondary | ICD-10-CM | POA: Diagnosis not present

## 2018-09-09 DIAGNOSIS — E7800 Pure hypercholesterolemia, unspecified: Secondary | ICD-10-CM | POA: Diagnosis not present

## 2018-09-28 ENCOUNTER — Ambulatory Visit (INDEPENDENT_AMBULATORY_CARE_PROVIDER_SITE_OTHER): Payer: Commercial Managed Care - PPO

## 2018-09-28 ENCOUNTER — Other Ambulatory Visit: Payer: Self-pay | Admitting: Cardiovascular Disease

## 2018-09-28 DIAGNOSIS — I359 Nonrheumatic aortic valve disorder, unspecified: Secondary | ICD-10-CM | POA: Diagnosis not present

## 2018-09-28 DIAGNOSIS — Z5181 Encounter for therapeutic drug level monitoring: Secondary | ICD-10-CM | POA: Diagnosis not present

## 2018-09-28 DIAGNOSIS — Z952 Presence of prosthetic heart valve: Secondary | ICD-10-CM | POA: Diagnosis not present

## 2018-09-28 LAB — POCT INR: INR: 2.3 (ref 2.0–3.0)

## 2018-09-28 NOTE — Patient Instructions (Signed)
Description   Continue on same dosage 1 tablet daily except 1/2 tablet on Sundays, Tuesdays, and Thursdays.  Recheck in 6 weeks.  Coumadin Clinic [phone]

## 2018-11-06 ENCOUNTER — Telehealth: Payer: Self-pay

## 2018-11-06 NOTE — Telephone Encounter (Signed)

## 2018-11-09 ENCOUNTER — Ambulatory Visit (INDEPENDENT_AMBULATORY_CARE_PROVIDER_SITE_OTHER): Payer: Commercial Managed Care - PPO | Admitting: *Deleted

## 2018-11-09 ENCOUNTER — Other Ambulatory Visit: Payer: Self-pay

## 2018-11-09 DIAGNOSIS — Z952 Presence of prosthetic heart valve: Secondary | ICD-10-CM

## 2018-11-09 DIAGNOSIS — I359 Nonrheumatic aortic valve disorder, unspecified: Secondary | ICD-10-CM

## 2018-11-09 DIAGNOSIS — Z5181 Encounter for therapeutic drug level monitoring: Secondary | ICD-10-CM

## 2018-11-09 LAB — POCT INR: INR: 2.5 (ref 2.0–3.0)

## 2018-12-25 ENCOUNTER — Telehealth: Payer: Self-pay

## 2018-12-25 NOTE — Telephone Encounter (Signed)

## 2018-12-28 ENCOUNTER — Ambulatory Visit (INDEPENDENT_AMBULATORY_CARE_PROVIDER_SITE_OTHER): Payer: Commercial Managed Care - PPO | Admitting: Pharmacist Clinician (PhC)/ Clinical Pharmacy Specialist

## 2018-12-28 ENCOUNTER — Other Ambulatory Visit: Payer: Self-pay

## 2018-12-28 DIAGNOSIS — I359 Nonrheumatic aortic valve disorder, unspecified: Secondary | ICD-10-CM | POA: Diagnosis not present

## 2018-12-28 DIAGNOSIS — Z5181 Encounter for therapeutic drug level monitoring: Secondary | ICD-10-CM | POA: Diagnosis not present

## 2018-12-28 DIAGNOSIS — Z952 Presence of prosthetic heart valve: Secondary | ICD-10-CM | POA: Diagnosis not present

## 2018-12-28 LAB — POCT INR: INR: 2 (ref 2.0–3.0)

## 2019-01-14 ENCOUNTER — Telehealth: Payer: Self-pay

## 2019-01-14 NOTE — Telephone Encounter (Signed)
Obtained verbal consent from pt for a video visit. Pt will have vitals ready for appt.    YOUR CARDIOLOGY TEAM HAS ARRANGED FOR AN E-VISIT FOR YOUR APPOINTMENT - PLEASE REVIEW IMPORTANT INFORMATION BELOW SEVERAL DAYS PRIOR TO YOUR APPOINTMENT  Due to the recent COVID-19 pandemic, we are transitioning in-person office visits to tele-medicine visits in an effort to decrease unnecessary exposure to our patients, their families, and staff. These visits are billed to your insurance just like a normal visit is. We also encourage you to sign up for MyChart if you have not already done so. You will need a smartphone if possible. For patients that do not have this, we can still complete the visit using a regular telephone but do prefer a smartphone to enable video when possible. You may have a family member that lives with you that can help. If possible, we also ask that you have a blood pressure cuff and scale at home to measure your blood pressure, heart rate and weight prior to your scheduled appointment. Patients with clinical needs that need an in-person evaluation and testing will still be able to come to the office if absolutely necessary. If you have any questions, feel free to call our office.     YOUR PROVIDER WILL BE USING THE FOLLOWING PLATFORM TO COMPLETE YOUR VISIT: Doximity . IF USING MYCHART - How to Download the MyChart App to Your SmartPhone   - If Apple, go to [employer] and type in MyChart in the search bar and download the app. If Android, ask patient to go to [employer] and type in [place] in the search bar and download the app. The app is free but as with any other app downloads, your phone may require you to verify saved payment information or Apple/Android password.  - You will need to then log into the app with your MyChart username and password, and select [facility] as your healthcare provider to link the account.  - When it is time for your visit, go to the MyChart app, find  appointments, and click Begin Video Visit. Be sure to Select Allow for your device to access the Microphone and Camera for your visit. You will then be connected, and your provider will be with you shortly.  **If you have any issues connecting or need assistance, please contact MyChart service desk [phone])**  **If using a computer, in order to ensure the best quality for your visit, you will need to use either of the following Internet Browsers: Insurance underwriter or [employer]**  . IF USING DOXIMITY or DOXY.ME - The staff will give you instructions on receiving your link to join the meeting the day of your visit.      2-3 DAYS BEFORE YOUR APPOINTMENT  You will receive a telephone call from one of our [place] team members - your caller ID may say "Unknown caller." If this is a video visit, we will walk you through how to get the video launched on your phone. We will remind you check your blood pressure, heart rate and weight prior to your scheduled appointment. If you have an Apple Watch or Kardia, please upload any pertinent ECG strips the day before or morning of your appointment to [facility]. Our staff will also make sure you have reviewed the consent and agree to move forward with your scheduled tele-health visit.     THE DAY OF YOUR APPOINTMENT  Approximately 15 minutes prior to your scheduled appointment, you will  receive a telephone call from one of [place] team - your caller ID may say "Unknown caller."  Our staff will confirm medications, vital signs for the day and any symptoms you may be experiencing. Please have this information available prior to the time of visit start. It may also be helpful for you to have a pad of paper and pen handy for any instructions given during your visit. They will also walk you through joining the smartphone meeting if this is a video visit.    CONSENT FOR TELE-HEALTH VISIT - PLEASE REVIEW  I hereby voluntarily request, consent  and authorize [facility] and its employed or contracted physicians, physician assistants, nurse practitioners or other licensed health care professionals (the Practitioner), to provide me with telemedicine health care services (the "Services") as deemed necessary by the treating Practitioner. I acknowledge and consent to receive the Services by the Practitioner via telemedicine. I understand that the telemedicine visit will involve communicating with the Practitioner through live audiovisual communication technology and the disclosure of certain medical information by electronic transmission. I acknowledge that I have been given the opportunity to request an in-person assessment or other available alternative prior to the telemedicine visit and am voluntarily participating in the telemedicine visit.  I understand that I have the right to withhold or withdraw my consent to the use of telemedicine in the course of my care at any time, without affecting my right to future care or treatment, and that the Practitioner or I may terminate the telemedicine visit at any time. I understand that I have the right to inspect all information obtained and/or recorded in the course of the telemedicine visit and may receive copies of available information for a reasonable fee.  I understand that some of the potential risks of receiving the Services via telemedicine include:  Marland Kitchen Delay or interruption in medical evaluation due to technological equipment failure or disruption; . Information transmitted may not be sufficient (e.g. poor resolution of images) to allow for appropriate medical decision making by the Practitioner; and/or  . In rare instances, security protocols could fail, causing a breach of personal health information.  Furthermore, I acknowledge that it is my responsibility to provide information about my medical history, conditions and care that is complete and accurate to the best of my ability. I acknowledge  that Practitioner's advice, recommendations, and/or decision may be based on factors not within their control, such as incomplete or inaccurate data provided by me or distortions of diagnostic images or specimens that may result from electronic transmissions. I understand that the practice of medicine is not an exact science and that Practitioner makes no warranties or guarantees regarding treatment outcomes. I acknowledge that I will receive a copy of this consent concurrently upon execution via email to the email address I last provided but may also request a printed copy by calling the office of [facility].    I understand that my insurance will be billed for this visit.   I have read or had this consent read to me. . I understand the contents of this consent, which adequately explains the benefits and risks of the Services being provided via telemedicine.  . I have been provided ample opportunity to ask questions regarding this consent and the Services and have had my questions answered to my satisfaction. . I give my informed consent for the services to be provided through the use of telemedicine in my medical care  By participating in this telemedicine visit I agree  to the above.

## 2019-01-22 ENCOUNTER — Telehealth: Payer: Commercial Managed Care - PPO | Admitting: Cardiovascular Disease

## 2019-02-15 ENCOUNTER — Telehealth: Payer: Self-pay

## 2019-02-15 NOTE — Telephone Encounter (Signed)

## 2019-02-22 ENCOUNTER — Ambulatory Visit (INDEPENDENT_AMBULATORY_CARE_PROVIDER_SITE_OTHER): Payer: Commercial Managed Care - PPO

## 2019-02-22 ENCOUNTER — Other Ambulatory Visit: Payer: Self-pay

## 2019-02-22 DIAGNOSIS — Z952 Presence of prosthetic heart valve: Secondary | ICD-10-CM

## 2019-02-22 DIAGNOSIS — I359 Nonrheumatic aortic valve disorder, unspecified: Secondary | ICD-10-CM | POA: Diagnosis not present

## 2019-02-22 DIAGNOSIS — Z5181 Encounter for therapeutic drug level monitoring: Secondary | ICD-10-CM

## 2019-02-22 LAB — POCT INR: INR: 2.3 (ref 2.0–3.0)

## 2019-02-22 NOTE — Patient Instructions (Signed)
Description   Continue on same dosage 1 tablet daily except 1/2 tablet on Sundays, Tuesdays, and Thursdays.  Recheck in 7 weeks.  Coumadin Clinic [phone]

## 2019-03-05 ENCOUNTER — Other Ambulatory Visit: Payer: Self-pay | Admitting: Cardiovascular Disease

## 2019-04-01 ENCOUNTER — Other Ambulatory Visit: Payer: Self-pay | Admitting: Cardiovascular Disease

## 2019-04-12 ENCOUNTER — Other Ambulatory Visit: Payer: Self-pay

## 2019-04-12 ENCOUNTER — Ambulatory Visit (INDEPENDENT_AMBULATORY_CARE_PROVIDER_SITE_OTHER): Payer: Commercial Managed Care - PPO | Admitting: *Deleted

## 2019-04-12 DIAGNOSIS — Z952 Presence of prosthetic heart valve: Secondary | ICD-10-CM | POA: Diagnosis not present

## 2019-04-12 DIAGNOSIS — Z5181 Encounter for therapeutic drug level monitoring: Secondary | ICD-10-CM | POA: Diagnosis not present

## 2019-04-12 DIAGNOSIS — I359 Nonrheumatic aortic valve disorder, unspecified: Secondary | ICD-10-CM

## 2019-04-12 LAB — POCT INR: INR: 2.2 (ref 2.0–3.0)

## 2019-04-12 NOTE — Patient Instructions (Signed)
Description   Continue on same dosage 1 tablet daily except 1/2 tablet on Sundays, Tuesdays, and Thursdays.  Recheck in 7 weeks.  Coumadin Clinic [phone]     

## 2019-04-28 ENCOUNTER — Other Ambulatory Visit: Payer: Self-pay | Admitting: Internal Medicine

## 2019-04-28 DIAGNOSIS — Z1231 Encounter for screening mammogram for malignant neoplasm of breast: Secondary | ICD-10-CM

## 2019-05-20 ENCOUNTER — Other Ambulatory Visit: Payer: Self-pay

## 2019-05-20 DIAGNOSIS — I359 Nonrheumatic aortic valve disorder, unspecified: Secondary | ICD-10-CM

## 2019-05-27 ENCOUNTER — Other Ambulatory Visit: Payer: Self-pay | Admitting: Cardiovascular Disease

## 2019-06-02 ENCOUNTER — Ambulatory Visit
Admission: RE | Admit: 2019-06-02 | Discharge: 2019-06-02 | Disposition: A | Discharge status: Home / Self Care | Payer: Commercial Managed Care - PPO | Source: Ambulatory Visit | Attending: Internal Medicine | Admitting: Internal Medicine

## 2019-06-02 ENCOUNTER — Other Ambulatory Visit: Payer: Self-pay

## 2019-06-02 DIAGNOSIS — Z1231 Encounter for screening mammogram for malignant neoplasm of breast: Secondary | ICD-10-CM

## 2019-06-03 ENCOUNTER — Ambulatory Visit: Payer: Commercial Managed Care - PPO | Admitting: Cardiovascular Disease

## 2019-06-09 ENCOUNTER — Other Ambulatory Visit: Payer: Self-pay

## 2019-06-09 ENCOUNTER — Ambulatory Visit (INDEPENDENT_AMBULATORY_CARE_PROVIDER_SITE_OTHER): Payer: Commercial Managed Care - PPO | Admitting: *Deleted

## 2019-06-09 DIAGNOSIS — Z952 Presence of prosthetic heart valve: Secondary | ICD-10-CM

## 2019-06-09 DIAGNOSIS — Z5181 Encounter for therapeutic drug level monitoring: Secondary | ICD-10-CM

## 2019-06-09 DIAGNOSIS — I359 Nonrheumatic aortic valve disorder, unspecified: Secondary | ICD-10-CM | POA: Diagnosis not present

## 2019-06-09 LAB — POCT INR: INR: 2.4 (ref 2.0–3.0)

## 2019-06-09 NOTE — Patient Instructions (Signed)
Description   Continue on same dosage 1 tablet daily except 1/2 tablet on Sundays, Tuesdays, and Thursdays.  Recheck in 8 weeks. Coumadin Clinic [phone]

## 2019-07-19 ENCOUNTER — Ambulatory Visit (INDEPENDENT_AMBULATORY_CARE_PROVIDER_SITE_OTHER): Payer: Commercial Managed Care - PPO | Admitting: Cardiovascular Disease

## 2019-07-19 ENCOUNTER — Other Ambulatory Visit: Payer: Self-pay

## 2019-07-19 ENCOUNTER — Ambulatory Visit (HOSPITAL_COMMUNITY): Payer: Commercial Managed Care - PPO | Attending: Cardiology

## 2019-07-19 ENCOUNTER — Encounter: Payer: Self-pay | Admitting: Cardiovascular Disease

## 2019-07-19 VITALS — BP 112/84 | HR 57 | Ht 62.0 in | Wt 186.0 lb

## 2019-07-19 DIAGNOSIS — I359 Nonrheumatic aortic valve disorder, unspecified: Secondary | ICD-10-CM | POA: Diagnosis not present

## 2019-07-19 MED ORDER — PERFLUTREN LIPID MICROSPHERE
1.0000 mL | INTRAVENOUS | Status: AC | PRN
  Administered 2019-07-19: 3 mL via INTRAVENOUS

## 2019-07-19 NOTE — Patient Instructions (Signed)
Medication Instructions:  Your provider recommends that you continue on your current medications as directed. Please refer to the Current Medication list given to you today.   *If you need a refill on your cardiac medications before your next appointment, please call your pharmacy*  Testing/Procedures: Your provider has requested that you have an echocardiogram In 1 year. Echocardiography is a painless test that uses sound waves to create images of your heart. It provides your doctor with information about the size and shape of your heart and how well your heart's chambers and valves are working. This procedure takes approximately one hour. There are no restrictions for this procedure.  Follow-Up: You will be called to scheduled your 1 year echo and office visit.

## 2019-07-19 NOTE — Progress Notes (Signed)
Cardiology Office Note:    Date:  07/19/2019   ID:  [name], DOB 12/11/1955, MRN [id number]  PCP:  Wenda Low, MD  Cardiologist:  Sherren Mocha, MD  Electrophysiologist:  None   Referring MD: Lanice Shirts, *   Chief Complaint  Patient presents with  . Follow-up    Aortic Valve Disease    History of Present Illness:    [name] is a 63 y.o. female with a hx of aortic valve disease presented for follow-up evaluation.  The patient has a history of severe bicuspid aortic valve stenosis and she underwent mechanical aortic valve replacement in 2012 with a 19 mm mechanical bileaflet valve.  She is had the elevated transvalvular gradient since surgery and has undergone extensive evaluation including transesophageal echo and CTA studies to evaluate for any leaflet dysfunction.  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    The patient has been doing well from a cardiac perspective.  She is now working from home since the beginning of the COVID-19 pandemic.  She is up and down the stairs and doing all of her normal activities without significant shortness of breath, chest pain, or chest pressure.  She denies lightheadedness or syncope.  She has had no bleeding or bruising problems on a combination of warfarin and aspirin.  Past Medical History:  Diagnosis Date  . Angina    chest tightness and pressure due to aortic stenosis  . Aortic stenosis   . Diabetes mellitus    newly diagnosed in july, 2012  . GERD (gastroesophageal reflux disease)   . Headache(784.0)    migraines when younger  . Heart murmur    aortic stenosis - dr. Burt Knack is her cardiologist  . Hx of hysterectomy   . Hyperlipidemia   . Multinodular thyroid   . Palpitation   . PONV (postoperative nausea and vomiting)   . Shortness of breath    associated with as    Past Surgical History:  Procedure Laterality Date  . ABDOMINAL HYSTERECTOMY    .  AORTIC VALVE REPLACEMENT  06/25/2011   Procedure: AORTIC VALVE REPLACEMENT (AVR);  Surgeon: Tharon Aquas Adelene Idler, MD;  Location: [facility];  Service: Open Heart Surgery;  Laterality: N/A;  . BREAST CYST ASPIRATION Left   . BUNIONECTOMY    . CARDIAC VALVE REPLACEMENT     Aortic Valve   06/25/2011  . CESAREAN SECTION    . CYSTOSCOPY WITH RETROGRADE PYELOGRAM, URETEROSCOPY AND STENT PLACEMENT Left 10/21/2014   Procedure: CYSTOSCOPY WITH LEFT RETROGRADE PYELOGRAM, URETEROSCOPY BIOPSY AND STENT PLACEMENT;  Surgeon: Alexis Frock, MD;  Location: [facility];  Service: Urology;  Laterality: Left;  . PARTIAL HYSTERECTOMY    . TEE WITHOUT CARDIOVERSION N/A 09/18/2012   Procedure: TRANSESOPHAGEAL ECHOCARDIOGRAM (TEE);  Surgeon: Fay Records, MD;  [facility];  Service: Cardiovascular;  Laterality: N/A;    Current Medications: Current Meds  Medication Sig  . aspirin 81 MG tablet Take 81 mg by mouth every morning.   Marland Kitchen CRESTOR 20 MG tablet TAKE 1 TABLET BY MOUTH ONCE A DAY  . glimepiride (AMARYL) 1 MG tablet Take 1 mg by mouth 2 (two) times daily.   . metoprolol tartrate (LOPRESSOR) 25 MG tablet TAKE 1 TABLET TWICE A DAY (BETA BLOCKER)  . omega-3 acid ethyl esters (LOVAZA) 1 g capsule Take 1 g by mouth daily.  Marland Kitchen warfarin (COUMADIN) 5 MG tablet TAKE AS DIRECTED BY THE COUMADIN CLINIC  Allergies:   Atorvastatin, Codeine, Morphine and related, Niacin, and Percocet [oxycodone-acetaminophen]   Social History   Socioeconomic History  . Marital status: Married    Spouse name: Not on file  . Number of children: Not on file  . Years of education: Not on file  . Highest education level: Not on file  Occupational History  . Not on file  Social Needs  . Financial resource strain: Not on file  . Food insecurity    Worry: Not on file    Inability: Not on file  . Transportation needs    Medical: Not on file    Non-medical: Not on file  Tobacco Use  . Smoking status: Former Smoker    Packs/day:  1.00    Years: 15.00    Pack years: 15.00    Types: Cigarettes    Quit date: 08/12/1994    Years since quitting: 24.9  . Smokeless tobacco: Never Used  Substance and Sexual Activity  . Alcohol use: No  . Drug use: No  . Sexual activity: Not on file  Lifestyle  . Physical activity    Days per week: Not on file    Minutes per session: Not on file  . Stress: Not on file  Relationships  . Social Herbalist on phone: Not on file    Gets together: Not on file    Attends religious service: Not on file    Active member of club or organization: Not on file    Attends meetings of clubs or organizations: Not on file    Relationship status: Not on file  Other Topics Concern  . Not on file  Social History Narrative   Married - one daughter 69 y/o   Occupation: Employed as Banker for [employer] History: The patient's family history includes Breast cancer in her paternal aunt; COPD in her father; Cancer (age of onset: 44) in her mother; Coronary artery disease in her father; Diabetes in her mother; Hypertension in an other family member.  ROS:   Please see the history of present illness.    All other systems reviewed and are negative.  EKGs/Labs/Other Studies Reviewed:    The following studies were reviewed today: Patient's echo images are personally reviewed.  LV function is vigorous with LVEF greater than 65%.  Mechanical aortic valve function demonstrates persistently elevated but stable transvalvular gradients with a mean gradient of 32 mmHg.  Mean gradient in the past has been in excess of 40 mmHg at times.  There is no aortic valve insufficiency.  EKG:  EKG is  ordered today.  The ekg ordered today demonstrates sinus bradycardia 57 bpm, otherwise normal.  Recent Labs: No results found for requested labs within last 8760 hours.  Recent Lipid Panel    Component Value Date/Time   CHOL 163 03/03/2013 0732   TRIG 238.0 (H) 03/03/2013 0732   HDL 46.20 03/03/2013 0732    CHOLHDL 4 03/03/2013 0732   VLDL 47.6 (H) 03/03/2013 0732   LDLCALC 91 03/18/2011 0743   LDLDIRECT 93.3 03/03/2013 0732    Physical Exam:    VS:  BP 112/84   Pulse (!) 57   Ht 5\' 2"  (1.575 m)   Wt 186 lb (84.4 kg)   SpO2 98%   BMI 34.02 kg/m     Wt Readings from Last 3 Encounters:  07/19/19 186 lb (84.4 kg)  07/15/18 185 lb 12.8 oz (84.3 kg)  01/12/18 185 lb (83.9  kg)     GEN:  Well nourished, well developed in no acute distress HEENT: Normal NECK: No JVD; No carotid bruits LYMPHATICS: No lymphadenopathy CARDIAC: RRR, 2/6 systolic murmur at the right upper sternal border, crisp mechanical A2 RESPIRATORY:  Clear to auscultation without rales, wheezing or rhonchi  ABDOMEN: Soft, non-tender, non-distended MUSCULOSKELETAL:  No edema; No deformity  SKIN: Warm and dry NEUROLOGIC:  Alert and oriented x 3 PSYCHIATRIC:  Normal affect   ASSESSMENT:    1. Aortic valve disorder    PLAN:    In order of problems listed above:  1. The patient has New York Heart Association functional class I symptoms with moderately elevated transaortic valve gradients likely related to vigorous LV function and patient prosthesis mismatch with a small 19 mm mechanical heart valve.  She appears clinically stable and I will see her back in 1 year for regular follow-up.  She understands the need to continue oral anticoagulation and antiplatelet therapy with aspirin.  She is very compliant with Coumadin follow-up and she inquires about home monitoring today.  We will get her in touch with our Coumadin clinic who will start the process of ordering a home INR monitor through her insurance company.   Medication Adjustments/Labs and Tests Ordered: Current medicines are reviewed at length with the patient today.  Concerns regarding medicines are outlined above.  Orders Placed This Encounter  Procedures  . EKG 12-Lead  . ECHOCARDIOGRAM COMPLETE   No orders of the defined types were placed in this  encounter.   Patient Instructions  Medication Instructions:  Your provider recommends that you continue on your current medications as directed. Please refer to the Current Medication list given to you today.   *If you need a refill on your cardiac medications before your next appointment, please call your pharmacy*  Testing/Procedures: Your provider has requested that you have an echocardiogram In 1 year. Echocardiography is a painless test that uses sound waves to create images of your heart. It provides your doctor with information about the size and shape of your heart and how well your heart's chambers and valves are working. This procedure takes approximately one hour. There are no restrictions for this procedure.  Follow-Up: You will be called to scheduled your 1 year echo and office visit.    Signed, Sherren Mocha, MD  07/19/2019 1:42 PM    [facility]

## 2019-07-22 ENCOUNTER — Telehealth: Payer: Self-pay | Admitting: Pharmacist

## 2019-07-22 NOTE — Telephone Encounter (Signed)
Application for [employer] INR home monitoring faxed 07/22/2019

## 2019-07-23 ENCOUNTER — Encounter: Payer: Self-pay | Admitting: Pharmacist

## 2019-07-23 NOTE — Telephone Encounter (Signed)
This encounter was created in error - please disregard.

## 2019-07-23 NOTE — Telephone Encounter (Signed)
[employer] calling for insurance and demographics. Faxed over to them

## 2019-07-30 ENCOUNTER — Other Ambulatory Visit: Payer: Self-pay

## 2019-07-30 ENCOUNTER — Ambulatory Visit (INDEPENDENT_AMBULATORY_CARE_PROVIDER_SITE_OTHER): Payer: Commercial Managed Care - PPO | Admitting: Pharmacist

## 2019-07-30 DIAGNOSIS — Z5181 Encounter for therapeutic drug level monitoring: Secondary | ICD-10-CM

## 2019-07-30 DIAGNOSIS — I359 Nonrheumatic aortic valve disorder, unspecified: Secondary | ICD-10-CM

## 2019-07-30 DIAGNOSIS — Z952 Presence of prosthetic heart valve: Secondary | ICD-10-CM

## 2019-07-30 LAB — POCT INR: INR: 2.5 (ref 2.0–3.0)

## 2019-07-30 NOTE — Patient Instructions (Signed)
Description   Continue on same dosage 1 tablet daily except 1/2 tablet on Sundays, Tuesdays, and Thursdays.  Recheck in 8 weeks. Coumadin Clinic [phone]

## 2019-08-03 ENCOUNTER — Ambulatory Visit: Payer: Commercial Managed Care - PPO | Attending: Internal Medicine

## 2019-08-03 DIAGNOSIS — Z20822 Contact with and (suspected) exposure to covid-19: Secondary | ICD-10-CM

## 2019-08-05 LAB — NOVEL CORONAVIRUS, NAA: SARS-CoV-2, NAA: NOT DETECTED

## 2019-08-12 ENCOUNTER — Telehealth: Payer: Self-pay

## 2019-08-12 NOTE — Telephone Encounter (Signed)
Pt called into clinic to report she is positive for Covid and has home health coming out to bring her new medications.  Pt is unsure of home health company coming out today, but is going to be started on Azithromycin, Methylprednisolone, and an Albuterol inhaler.  Advised pt Azithromycin and Methylprednisolone can cause INR to elevate slightly,  Pt states the [facility] RN advised her she could check pt's INR while on their services.  Pt is going to have [facility] RN call our office for a verbal order once she is at the pt's home.  Pt is planning on starting these medications today once the [facility] RN drops them off. Will await call back from [facility] RN to give verbal order.

## 2019-08-12 NOTE — Progress Notes (Signed)
Covid home visit on behalf of [employer].  Glory Buff, DNP was involved in the visit via FaceTime and visit detail transcribed into [facility]ron[facility] by [facility] staff.

## 2019-08-16 NOTE — Progress Notes (Signed)
Covid home visit on behalf of [employer].  [name] states she's feeling much better today than she had been.  Details of visit have been transcribed by Kelby Aline, DNP into DrChrono

## 2019-08-16 NOTE — Telephone Encounter (Signed)
Called spoke with pt, [facility] RN was just a one time visit.  Pt was dx with Covid 08/05/19, started on azithro and methylprednisolone 08/12/19.  Pt has increased her vit K intake while on meds will complete azithro tomorrow and methylprednisolone on Thursday.  Pt still coughing and fatigued, made appt on 08/23/19, advised pt TCB if still symptomatic to reschedule.

## 2019-08-23 ENCOUNTER — Other Ambulatory Visit: Payer: Self-pay

## 2019-08-23 ENCOUNTER — Ambulatory Visit (INDEPENDENT_AMBULATORY_CARE_PROVIDER_SITE_OTHER): Payer: Commercial Managed Care - PPO | Admitting: *Deleted

## 2019-08-23 DIAGNOSIS — Z952 Presence of prosthetic heart valve: Secondary | ICD-10-CM | POA: Diagnosis not present

## 2019-08-23 DIAGNOSIS — I359 Nonrheumatic aortic valve disorder, unspecified: Secondary | ICD-10-CM

## 2019-08-23 DIAGNOSIS — Z5181 Encounter for therapeutic drug level monitoring: Secondary | ICD-10-CM

## 2019-08-23 LAB — POCT INR: INR: 4.1 — AB (ref 2.0–3.0)

## 2019-08-23 NOTE — Patient Instructions (Signed)
Description   Do not take any Warfarin today then continue on same dosage 1 tablet daily except 1/2 tablet on Sundays, Tuesdays, and Thursdays.  Recheck in 2 weeks. Coumadin Clinic [phone]

## 2019-08-31 ENCOUNTER — Other Ambulatory Visit: Payer: Self-pay | Admitting: Cardiovascular Disease

## 2019-09-06 ENCOUNTER — Ambulatory Visit (INDEPENDENT_AMBULATORY_CARE_PROVIDER_SITE_OTHER): Payer: Commercial Managed Care - PPO | Admitting: *Deleted

## 2019-09-06 ENCOUNTER — Other Ambulatory Visit: Payer: Self-pay

## 2019-09-06 DIAGNOSIS — Z5181 Encounter for therapeutic drug level monitoring: Secondary | ICD-10-CM

## 2019-09-06 DIAGNOSIS — I359 Nonrheumatic aortic valve disorder, unspecified: Secondary | ICD-10-CM | POA: Diagnosis not present

## 2019-09-06 DIAGNOSIS — Z952 Presence of prosthetic heart valve: Secondary | ICD-10-CM

## 2019-09-06 LAB — POCT INR: INR: 2.6 (ref 2.0–3.0)

## 2019-09-06 NOTE — Patient Instructions (Signed)
Description   Continue on same dosage 1 tablet daily except 1/2 tablet on Sundays, Tuesdays, and Thursdays.  Recheck in 6 weeks. Coumadin Clinic [phone]

## 2019-10-13 ENCOUNTER — Other Ambulatory Visit: Payer: Self-pay

## 2019-10-13 ENCOUNTER — Ambulatory Visit (INDEPENDENT_AMBULATORY_CARE_PROVIDER_SITE_OTHER): Payer: Commercial Managed Care - PPO | Admitting: *Deleted

## 2019-10-13 DIAGNOSIS — I359 Nonrheumatic aortic valve disorder, unspecified: Secondary | ICD-10-CM | POA: Diagnosis not present

## 2019-10-13 DIAGNOSIS — Z952 Presence of prosthetic heart valve: Secondary | ICD-10-CM | POA: Diagnosis not present

## 2019-10-13 DIAGNOSIS — Z5181 Encounter for therapeutic drug level monitoring: Secondary | ICD-10-CM | POA: Diagnosis not present

## 2019-10-13 LAB — POCT INR: INR: 1.8 — AB (ref 2.0–3.0)

## 2019-10-13 NOTE — Patient Instructions (Signed)
Description   Take 1.5 tablets today and then continue on same dosage 1 tablet daily except 1/2 tablet on Sundays, Tuesdays, and Thursdays.  Recheck in 3 weeks. Coumadin Clinic [phone]

## 2019-10-19 ENCOUNTER — Telehealth: Payer: Self-pay | Admitting: Cardiovascular Disease

## 2019-10-19 NOTE — Progress Notes (Signed)
Cardiology Office Note    Date:  10/20/2019   ID:  ORAL ROBLING, DOB February 24, 1956, MRN [id number]  PCP:  Wenda Low, MD  Cardiologist:  Dr. Burt Knack  Chief Complaint: palpitations  History of Present Illness:   [name] is a 64 y.o. female severe bicuspid aortic valve stenosis s/p mechanical AVR and HLD seen for follow up.  The patient has a history of severe bicuspid aortic valve stenosis and she underwent mechanical aortic valve replacement in 2012 with a 19 mm mechanical bileaflet valve. She is had the elevated transvalvular gradient since surgery and has undergone extensive evaluation including transesophageal echo and CTA studies to evaluate for any leaflet dysfunction. 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  She was doing well when seen by Dr. Burt Knack 07/19/19. Remained active without any problem.   Monday evening patient had unusual heart feeling.  She felt "flip-flop" of her heart.  Patient has Apple Watch.  EKG did not show atrial fibrillation.  Review of strips personally shows PVCS, HR was in 70s.  no chest pain, shortness of breath, orthopnea, PND, lower extremity edema or melena.  She felt weak.  Recent lab work by PCP in Jan showed normal electrolytes and TSH.     Past Medical History:  Diagnosis Date  . Angina    chest tightness and pressure due to aortic stenosis  . Aortic stenosis   . Diabetes mellitus    newly diagnosed in july, 2012  . GERD (gastroesophageal reflux disease)   . Headache(784.0)    migraines when younger  . Heart murmur    aortic stenosis - dr. Burt Knack is her cardiologist  . Hx of hysterectomy   . Hyperlipidemia   . Multinodular thyroid   . Palpitation   . PONV (postoperative nausea and vomiting)   . Shortness of breath    associated with as    Past Surgical History:  Procedure Laterality Date  . ABDOMINAL HYSTERECTOMY    . AORTIC VALVE REPLACEMENT  06/25/2011   Procedure: AORTIC  VALVE REPLACEMENT (AVR);  Surgeon: Tharon Aquas Adelene Idler, MD;  Location: [facility];  Service: Open Heart Surgery;  Laterality: N/A;  . BREAST CYST ASPIRATION Left   . BUNIONECTOMY    . CARDIAC VALVE REPLACEMENT     Aortic Valve   06/25/2011  . CESAREAN SECTION    . CYSTOSCOPY WITH RETROGRADE PYELOGRAM, URETEROSCOPY AND STENT PLACEMENT Left 10/21/2014   Procedure: CYSTOSCOPY WITH LEFT RETROGRADE PYELOGRAM, URETEROSCOPY BIOPSY AND STENT PLACEMENT;  Surgeon: Alexis Frock, MD;  Location: [facility];  Service: Urology;  Laterality: Left;  . PARTIAL HYSTERECTOMY    . TEE WITHOUT CARDIOVERSION N/A 09/18/2012   Procedure: TRANSESOPHAGEAL ECHOCARDIOGRAM (TEE);  Surgeon: Fay Records, MD;  Location: [facility]COPY;  Service: Cardiovascular;  Laterality: N/A;    Current Medications: Prior to Admission medications   Medication Sig Start Date End Date Taking? Authorizing Provider  aspirin 81 MG tablet Take 81 mg by mouth every morning.     [provider]  CRESTOR 20 MG tablet TAKE 1 TABLET BY MOUTH ONCE A DAY 02/22/13   Biagio Borg, MD  glimepiride (AMARYL) 1 MG tablet Take 1 mg by mouth 2 (two) times daily.     [provider]  metoprolol tartrate (LOPRESSOR) 25 MG tablet TAKE 1 TABLET TWICE A DAY (BETA BLOCKER) 05/27/19   Sherren Mocha, MD  omega-3 acid ethyl esters (LOVAZA) 1 g capsule Take 1 g by  mouth daily.    [provider]  warfarin (COUMADIN) 5 MG tablet TAKE AS DIRECTED BY THE COUMADIN CLINIC 09/01/19   Sherren Mocha, MD    Allergies:   Atorvastatin, Codeine, Morphine and related, Niacin, and Percocet [oxycodone-acetaminophen]   Social History   Socioeconomic History  . Marital status: Married    Spouse name: Not on file  . Number of children: Not on file  . Years of education: Not on file  . Highest education level: Not on file  Occupational History  . Not on file  Tobacco Use  . Smoking status: Former Smoker    Packs/day: 1.00    Years: 15.00    Pack years:  15.00    Types: Cigarettes    Quit date: 08/12/1994    Years since quitting: 25.2  . Smokeless tobacco: Never Used  Substance and Sexual Activity  . Alcohol use: No  . Drug use: No  . Sexual activity: Not on file  Other Topics Concern  . Not on file  Social History Narrative   Married - one daughter 75 y/o   Occupation: Employed as Banker for [employer] Strain:   . Difficulty of Paying Living Expenses: Not on file  Food Insecurity:   . Worried About Charity fundraiser in the Last Year: Not on file  . Ran Out of Food in the Last Year: Not on file  Transportation Needs:   . Lack of Transportation (Medical): Not on file  . Lack of Transportation (Non-Medical): Not on file  Physical Activity:   . Days of Exercise per Week: Not on file  . Minutes of Exercise per Session: Not on file  Stress:   . Feeling of Stress : Not on file  Social Connections:   . Frequency of Communication with Friends and Family: Not on file  . Frequency of Social Gatherings with Friends and Family: Not on file  . Attends Religious Services: Not on file  . Active Member of Clubs or Organizations: Not on file  . Attends Archivist Meetings: Not on file  . Marital Status: Not on file     Family History:  The patient's family history includes Breast cancer in her paternal aunt; COPD in her father; Cancer (age of onset: 67) in her mother; Coronary artery disease in her father; Diabetes in her mother; Hypertension in an other family member.   ROS:   Please see the history of present illness.    ROS All other systems reviewed and are negative.   PHYSICAL EXAM:   VS:  BP 138/80   Pulse (!) 59   Ht 5\' 1"  (1.549 m)   Wt 183 lb 12.8 oz (83.4 kg)   BMI 34.73 kg/m    GEN: Well nourished, well developed, in no acute distress  HEENT: normal  Neck: no JVD, carotid bruits, or masses Cardiac: RRR; no murmurs, rubs, or gallops,no edema  Respiratory:   clear to auscultation bilaterally, normal work of breathing GI: soft, nontender, nondistended, + BS MS: no deformity or atrophy  Skin: warm and dry, no rash Neuro:  Alert and Oriented x 3, Strength and sensation are intact Psych: euthymic mood, full affect  Wt Readings from Last 3 Encounters:  10/20/19 183 lb 12.8 oz (83.4 kg)  07/19/19 186 lb (84.4 kg)  07/15/18 185 lb 12.8 oz (84.3 kg)      Studies/Labs Reviewed:   EKG:  EKG is ordered today.  The ekg ordered today demonstrates sinus bradycardia at 59 bpm, Chronic TWI in lead I and aVL  Recent Labs: No results found for requested labs within last 8760 hours.   Lipid Panel    Component Value Date/Time   CHOL 163 03/03/2013 0732   TRIG 238.0 (H) 03/03/2013 0732   HDL 46.20 03/03/2013 0732   CHOLHDL 4 03/03/2013 0732   VLDL 47.6 (H) 03/03/2013 0732   LDLCALC 91 03/18/2011 0743   LDLDIRECT 93.3 03/03/2013 0732    Additional studies/ records that were reviewed today include:   Echocardiogram: 07/19/19 1. Left ventricular ejection fraction, by visual estimation, is 60 to  65%. The left ventricle has normal function. There is mildly increased  left ventricular hypertrophy.  2. Left ventricular diastolic parameters are consistent with Grade I  diastolic dysfunction (impaired relaxation).  3. The left ventricle has no regional wall motion abnormalities.  4. Global right ventricle has normal systolic function.The right  ventricular size is normal. No increase in right ventricular wall  thickness.  5. Left atrial size was normal.  6. Right atrial size was normal.  7. Mild mitral annular calcification.  8. The mitral valve is normal in structure. No evidence of mitral valve  regurgitation. No evidence of mitral stenosis.  9. Mechanical aortic valve. The valve is not well-visualized, but mean  gradient is moderately elevated at 31 mmHg with dimensionless index 0.33.  No significant regurgitation noted.  10. The  pulmonic valve was normal in structure. Pulmonic valve  regurgitation is not visualized.  11. The inferior vena cava is normal in size with greater than 50%  respiratory variability, suggesting right atrial pressure of 3 mmHg.  12. The tricuspid valve is normal in structure. Tricuspid valve  regurgitation is not demonstrated.  13. TR signal is inadequate for assessing pulmonary artery systolic  pressure.     ASSESSMENT & PLAN:    1.  PVC Patient was symptomatic.  She has Visual merchandiser.  Reviewed the strip personally.  No evidence of atrial fibrillation.  Reviewed alarming symptoms.  Continue beta-blocker at current dose.  Cannot uptitrate given baseline bradycardia.  Recent electrolytes and TSH was normal.  If recurrent episode may need Holter to quantify PVCs.   2. S/p mechanical aortic valve replacement - Echo 07/2019 showed mean gradient is moderately elevated at 31 mmHg with dimensionless index 0.33.  No significant regurgitation noted. Remained on ASA and warfarin.   3.  Hyperlipidemia -Most recent LDL was 71 on January 2021 -Continue Crestor 20 mg daily   Medication Adjustments/Labs and Tests Ordered: Current medicines are reviewed at length with the patient today.  Concerns regarding medicines are outlined above.  Medication changes, Labs and Tests ordered today are listed in the Patient Instructions below. Patient Instructions  Medication Instructions:  Your physician recommends that you continue on your current medications as directed. Please refer to the Current Medication list given to you today.  *If you need a refill on your cardiac medications before your next appointment, please call your pharmacy*   Lab Work: None ordered  If you have labs (blood work) drawn today and your tests are completely normal, you will receive your results only by: Marland Kitchen MyChart Message (if you have MyChart) OR . A paper copy in the mail If you have any lab test that is abnormal or we need  to change your treatment, we will call you to review the results.   Testing/Procedures: None ordered   Follow-Up: At [facility], you and  your health needs are our priority.  As part of our continuing mission to provide you with exceptional heart care, we have created designated Provider Care Teams.  These Care Teams include your primary Cardiologist (physician) and Advanced Practice Providers (APPs -  Physician Assistants and Nurse Practitioners) who all work together to provide you with the care you need, when you need it.  We recommend signing up for the patient portal called "MyChart".  Sign up information is provided on this After Visit Summary.  MyChart is used to connect with patients for Virtual Visits (Telemedicine).  Patients are able to view lab/test results, encounter notes, upcoming appointments, etc.  Non-urgent messages can be sent to your provider as well.   To learn more about what you can do with MyChart, go to [email or web address].    Your next appointment:   9 MONTHS  The format for your next appointment:   In Person  Provider:   Sherren Mocha, MD   Other Instructions      Signed, Leanor Kail, PA  10/20/2019 10:22 AM    [facility] [address], [place], [place]  [address] Phone: [phone]; Fax: [phone]

## 2019-10-19 NOTE — Telephone Encounter (Signed)
I spoke to the patient who has been experiencing a feeling of "heart flipping" with the usual CP, she regularly experiences.  She denies SOB or other symptoms, but would like to be evaluated.  I scheduled an appointment with VIN on 3/10.  She verbalized understanding and will go to the ED if symptoms worsen.

## 2019-10-19 NOTE — Telephone Encounter (Signed)
Patient c/o Palpitations:  High priority if patient c/o lightheadedness, shortness of breath, or chest pain  1) How long have you had palpitations/irregular HR/ Afib? Are you having the symptoms now? Started yesterday afternoon, still having them now.  2) Are you currently experiencing lightheadedness, SOB or CP? No  3) Do you have a history of afib (atrial fibrillation) or irregular heart rhythm? No, not as far as she knows  4) Have you checked your BP or HR? (document readings if available): HR 72   5) Are you experiencing any other symptoms? Fatigue   Jazzy is calling stating she started experiencing palpitations yesterday afternoon around 4 o'clock. She states she felt disconnected the whole day prior to the palpitations having a hard time focusing. The palpitations are still present now and she is also experiencing fatigue. She would like a nurses opinion on if she should make an appointment to come in to be seen in regards to this. Please advise.

## 2019-10-20 ENCOUNTER — Encounter: Payer: Self-pay | Admitting: Physician Assistant

## 2019-10-20 ENCOUNTER — Other Ambulatory Visit: Payer: Self-pay

## 2019-10-20 ENCOUNTER — Ambulatory Visit (INDEPENDENT_AMBULATORY_CARE_PROVIDER_SITE_OTHER): Payer: Commercial Managed Care - PPO | Admitting: Physician Assistant

## 2019-10-20 VITALS — BP 138/80 | HR 59 | Ht 61.0 in | Wt 183.8 lb

## 2019-10-20 DIAGNOSIS — Z952 Presence of prosthetic heart valve: Secondary | ICD-10-CM

## 2019-10-20 DIAGNOSIS — Z5181 Encounter for therapeutic drug level monitoring: Secondary | ICD-10-CM

## 2019-10-20 DIAGNOSIS — I493 Ventricular premature depolarization: Secondary | ICD-10-CM

## 2019-10-20 DIAGNOSIS — R002 Palpitations: Secondary | ICD-10-CM | POA: Diagnosis not present

## 2019-10-20 NOTE — Patient Instructions (Signed)
Medication Instructions:  Your physician recommends that you continue on your current medications as directed. Please refer to the Current Medication list given to you today.  *If you need a refill on your cardiac medications before your next appointment, please call your pharmacy*   Lab Work: None ordered  If you have labs (blood work) drawn today and your tests are completely normal, you will receive your results only by: Marland Kitchen MyChart Message (if you have MyChart) OR . A paper copy in the mail If you have any lab test that is abnormal or we need to change your treatment, we will call you to review the results.   Testing/Procedures: None ordered   Follow-Up: At [facility], you and your health needs are our priority.  As part of our continuing mission to provide you with exceptional heart care, we have created designated Provider Care Teams.  These Care Teams include your primary Cardiologist (physician) and Advanced Practice Providers (APPs -  Physician Assistants and Nurse Practitioners) who all work together to provide you with the care you need, when you need it.  We recommend signing up for the patient portal called "MyChart".  Sign up information is provided on this After Visit Summary.  MyChart is used to connect with patients for Virtual Visits (Telemedicine).  Patients are able to view lab/test results, encounter notes, upcoming appointments, etc.  Non-urgent messages can be sent to your provider as well.   To learn more about what you can do with MyChart, go to [email or web address].    Your next appointment:   9 MONTHS  The format for your next appointment:   In Person  Provider:   Sherren Mocha, MD   Other Instructions

## 2019-11-03 ENCOUNTER — Ambulatory Visit (INDEPENDENT_AMBULATORY_CARE_PROVIDER_SITE_OTHER): Payer: Commercial Managed Care - PPO

## 2019-11-03 ENCOUNTER — Other Ambulatory Visit: Payer: Self-pay

## 2019-11-03 DIAGNOSIS — Z5181 Encounter for therapeutic drug level monitoring: Secondary | ICD-10-CM | POA: Diagnosis not present

## 2019-11-03 DIAGNOSIS — I359 Nonrheumatic aortic valve disorder, unspecified: Secondary | ICD-10-CM | POA: Diagnosis not present

## 2019-11-03 DIAGNOSIS — Z952 Presence of prosthetic heart valve: Secondary | ICD-10-CM | POA: Diagnosis not present

## 2019-11-03 LAB — POCT INR: INR: 2.1 (ref 2.0–3.0)

## 2019-11-03 NOTE — Patient Instructions (Signed)
Description   Continue on same dosage 1 tablet daily except 1/2 tablet on Sundays, Tuesdays, and Thursdays.  Recheck in 4 weeks. [facility] [phone]

## 2019-12-01 ENCOUNTER — Other Ambulatory Visit: Payer: Self-pay

## 2019-12-01 ENCOUNTER — Ambulatory Visit (INDEPENDENT_AMBULATORY_CARE_PROVIDER_SITE_OTHER): Payer: Commercial Managed Care - PPO

## 2019-12-01 DIAGNOSIS — Z952 Presence of prosthetic heart valve: Secondary | ICD-10-CM | POA: Diagnosis not present

## 2019-12-01 DIAGNOSIS — I359 Nonrheumatic aortic valve disorder, unspecified: Secondary | ICD-10-CM | POA: Diagnosis not present

## 2019-12-01 DIAGNOSIS — Z5181 Encounter for therapeutic drug level monitoring: Secondary | ICD-10-CM

## 2019-12-01 LAB — POCT INR: INR: 1.9 — AB (ref 2.0–3.0)

## 2019-12-01 NOTE — Patient Instructions (Signed)
Description   Take 1.5 tablets today, then resume same dosage 1 tablet daily except 1/2 tablet on Sundays, Tuesdays, and Thursdays.  Recheck in 4 weeks. [facility] [phone]

## 2019-12-29 ENCOUNTER — Other Ambulatory Visit: Payer: Self-pay

## 2019-12-29 ENCOUNTER — Ambulatory Visit (INDEPENDENT_AMBULATORY_CARE_PROVIDER_SITE_OTHER): Payer: Commercial Managed Care - PPO

## 2019-12-29 DIAGNOSIS — Z952 Presence of prosthetic heart valve: Secondary | ICD-10-CM

## 2019-12-29 DIAGNOSIS — I359 Nonrheumatic aortic valve disorder, unspecified: Secondary | ICD-10-CM

## 2019-12-29 DIAGNOSIS — Z5181 Encounter for therapeutic drug level monitoring: Secondary | ICD-10-CM

## 2019-12-29 LAB — POCT INR: INR: 2.2 (ref 2.0–3.0)

## 2019-12-29 NOTE — Patient Instructions (Signed)
Description   Continue on same dosage 1 tablet daily except 1/2 tablet on Sundays, Tuesdays, and Thursdays.  Recheck in 4 weeks. [facility] [phone]

## 2019-12-30 ENCOUNTER — Other Ambulatory Visit: Payer: Self-pay | Admitting: Cardiovascular Disease

## 2020-01-26 ENCOUNTER — Other Ambulatory Visit: Payer: Self-pay

## 2020-01-26 ENCOUNTER — Ambulatory Visit (INDEPENDENT_AMBULATORY_CARE_PROVIDER_SITE_OTHER): Payer: Commercial Managed Care - PPO | Admitting: *Deleted

## 2020-01-26 DIAGNOSIS — Z952 Presence of prosthetic heart valve: Secondary | ICD-10-CM | POA: Diagnosis not present

## 2020-01-26 DIAGNOSIS — Z5181 Encounter for therapeutic drug level monitoring: Secondary | ICD-10-CM | POA: Diagnosis not present

## 2020-01-26 DIAGNOSIS — I359 Nonrheumatic aortic valve disorder, unspecified: Secondary | ICD-10-CM | POA: Diagnosis not present

## 2020-01-26 LAB — POCT INR: INR: 2.4 (ref 2.0–3.0)

## 2020-01-26 NOTE — Patient Instructions (Signed)
Description   Continue taking Warfarin 1 tablet daily except 1/2 tablet on Sundays, Tuesdays, and Thursdays. Recheck in 5 weeks. [facility] [phone]

## 2020-03-01 ENCOUNTER — Ambulatory Visit (INDEPENDENT_AMBULATORY_CARE_PROVIDER_SITE_OTHER): Payer: Commercial Managed Care - PPO | Admitting: *Deleted

## 2020-03-01 ENCOUNTER — Other Ambulatory Visit: Payer: Self-pay

## 2020-03-01 DIAGNOSIS — I359 Nonrheumatic aortic valve disorder, unspecified: Secondary | ICD-10-CM

## 2020-03-01 DIAGNOSIS — Z5181 Encounter for therapeutic drug level monitoring: Secondary | ICD-10-CM | POA: Diagnosis not present

## 2020-03-01 DIAGNOSIS — Z952 Presence of prosthetic heart valve: Secondary | ICD-10-CM | POA: Diagnosis not present

## 2020-03-01 LAB — POCT INR: INR: 1.5 — AB (ref 2.0–3.0)

## 2020-03-01 NOTE — Patient Instructions (Signed)
Description   Today take 1.5 tablets and tomorrow take 1 tablet then continue taking Warfarin 1 tablet daily except 1/2 tablet on Sundays, Tuesdays, and Thursdays. Recheck in 1 week (normally 6 weeks). [facility] [phone]

## 2020-03-08 ENCOUNTER — Other Ambulatory Visit: Payer: Self-pay

## 2020-03-08 ENCOUNTER — Ambulatory Visit (INDEPENDENT_AMBULATORY_CARE_PROVIDER_SITE_OTHER): Payer: Commercial Managed Care - PPO | Admitting: *Deleted

## 2020-03-08 DIAGNOSIS — Z5181 Encounter for therapeutic drug level monitoring: Secondary | ICD-10-CM | POA: Diagnosis not present

## 2020-03-08 DIAGNOSIS — I359 Nonrheumatic aortic valve disorder, unspecified: Secondary | ICD-10-CM | POA: Diagnosis not present

## 2020-03-08 DIAGNOSIS — Z952 Presence of prosthetic heart valve: Secondary | ICD-10-CM | POA: Diagnosis not present

## 2020-03-08 LAB — POCT INR: INR: 2.3 (ref 2.0–3.0)

## 2020-03-08 NOTE — Patient Instructions (Signed)
Description   Continue taking Warfarin 1 tablet daily except 1/2 tablet on Sundays, Tuesdays, and Thursdays. Recheck in 3 weeks. [facility] [phone]

## 2020-03-09 ENCOUNTER — Other Ambulatory Visit: Payer: Self-pay | Admitting: Cardiovascular Disease

## 2020-03-29 ENCOUNTER — Ambulatory Visit (INDEPENDENT_AMBULATORY_CARE_PROVIDER_SITE_OTHER): Payer: Commercial Managed Care - PPO | Admitting: *Deleted

## 2020-03-29 ENCOUNTER — Other Ambulatory Visit: Payer: Self-pay

## 2020-03-29 DIAGNOSIS — Z952 Presence of prosthetic heart valve: Secondary | ICD-10-CM | POA: Diagnosis not present

## 2020-03-29 DIAGNOSIS — I359 Nonrheumatic aortic valve disorder, unspecified: Secondary | ICD-10-CM | POA: Diagnosis not present

## 2020-03-29 DIAGNOSIS — Z5181 Encounter for therapeutic drug level monitoring: Secondary | ICD-10-CM

## 2020-03-29 LAB — POCT INR: INR: 1.6 — AB (ref 2.0–3.0)

## 2020-03-29 NOTE — Patient Instructions (Signed)
Description   Today take 1.5 tablets then start taking Warfarin 1 tablet daily except 1/2 tablet on  Tuesdays and Thursdays. Recheck in 3 weeks. [facility] Clinic [phone]

## 2020-04-19 ENCOUNTER — Ambulatory Visit (INDEPENDENT_AMBULATORY_CARE_PROVIDER_SITE_OTHER): Payer: Commercial Managed Care - PPO | Admitting: *Deleted

## 2020-04-19 ENCOUNTER — Other Ambulatory Visit: Payer: Self-pay

## 2020-04-19 DIAGNOSIS — I359 Nonrheumatic aortic valve disorder, unspecified: Secondary | ICD-10-CM

## 2020-04-19 DIAGNOSIS — Z952 Presence of prosthetic heart valve: Secondary | ICD-10-CM

## 2020-04-19 DIAGNOSIS — Z5181 Encounter for therapeutic drug level monitoring: Secondary | ICD-10-CM | POA: Diagnosis not present

## 2020-04-19 LAB — POCT INR: INR: 2 (ref 2.0–3.0)

## 2020-04-19 NOTE — Patient Instructions (Signed)
Description   Today take 1.5 tablets then continue taking Warfarin 1 tablet daily except 1/2 tablet on  Tuesdays and Thursdays. Recheck in 4 weeks. [facility] [phone]

## 2020-05-02 ENCOUNTER — Other Ambulatory Visit: Payer: Self-pay | Admitting: Internal Medicine

## 2020-05-02 DIAGNOSIS — Z1231 Encounter for screening mammogram for malignant neoplasm of breast: Secondary | ICD-10-CM

## 2020-05-14 ENCOUNTER — Other Ambulatory Visit: Payer: Self-pay | Admitting: Cardiovascular Disease

## 2020-05-17 ENCOUNTER — Other Ambulatory Visit: Payer: Self-pay

## 2020-05-17 ENCOUNTER — Ambulatory Visit (INDEPENDENT_AMBULATORY_CARE_PROVIDER_SITE_OTHER): Payer: Commercial Managed Care - PPO | Admitting: *Deleted

## 2020-05-17 DIAGNOSIS — Z5181 Encounter for therapeutic drug level monitoring: Secondary | ICD-10-CM

## 2020-05-17 DIAGNOSIS — I359 Nonrheumatic aortic valve disorder, unspecified: Secondary | ICD-10-CM | POA: Diagnosis not present

## 2020-05-17 DIAGNOSIS — Z952 Presence of prosthetic heart valve: Secondary | ICD-10-CM | POA: Diagnosis not present

## 2020-05-17 LAB — POCT INR: INR: 1.9 — AB (ref 2.0–3.0)

## 2020-05-17 NOTE — Patient Instructions (Signed)
Description   Today take 1.5 tablets then start taking Warfarin 1 tablet daily except 1/2 tablet on  Thursdays. Recheck in 3 weeks. [facility] [phone]

## 2020-06-05 ENCOUNTER — Ambulatory Visit
Admission: RE | Admit: 2020-06-05 | Discharge: 2020-06-05 | Disposition: A | Discharge status: Home / Self Care | Payer: Commercial Managed Care - PPO | Source: Ambulatory Visit | Attending: Internal Medicine | Admitting: Internal Medicine

## 2020-06-05 ENCOUNTER — Other Ambulatory Visit: Payer: Self-pay

## 2020-06-05 ENCOUNTER — Ambulatory Visit (INDEPENDENT_AMBULATORY_CARE_PROVIDER_SITE_OTHER): Payer: Commercial Managed Care - PPO | Admitting: *Deleted

## 2020-06-05 DIAGNOSIS — Z1231 Encounter for screening mammogram for malignant neoplasm of breast: Secondary | ICD-10-CM

## 2020-06-05 DIAGNOSIS — Z5181 Encounter for therapeutic drug level monitoring: Secondary | ICD-10-CM

## 2020-06-05 DIAGNOSIS — Z952 Presence of prosthetic heart valve: Secondary | ICD-10-CM | POA: Diagnosis not present

## 2020-06-05 DIAGNOSIS — I359 Nonrheumatic aortic valve disorder, unspecified: Secondary | ICD-10-CM

## 2020-06-05 LAB — POCT INR: INR: 2.3 (ref 2.0–3.0)

## 2020-06-05 NOTE — Patient Instructions (Signed)
Description   Continue to take Warfarin 1 tablet daily except 1/2 tablet on  Thursdays. Recheck in 4 weeks. [facility] [phone]

## 2020-07-05 ENCOUNTER — Other Ambulatory Visit: Payer: Self-pay

## 2020-07-05 ENCOUNTER — Ambulatory Visit (INDEPENDENT_AMBULATORY_CARE_PROVIDER_SITE_OTHER): Payer: Commercial Managed Care - PPO | Admitting: Pharmacist

## 2020-07-05 DIAGNOSIS — I359 Nonrheumatic aortic valve disorder, unspecified: Secondary | ICD-10-CM | POA: Diagnosis not present

## 2020-07-05 DIAGNOSIS — Z5181 Encounter for therapeutic drug level monitoring: Secondary | ICD-10-CM

## 2020-07-05 DIAGNOSIS — Z952 Presence of prosthetic heart valve: Secondary | ICD-10-CM

## 2020-07-05 LAB — POCT INR: INR: 2.9 (ref 2.0–3.0)

## 2020-07-05 NOTE — Patient Instructions (Signed)
Continue to take Warfarin 1 tablet daily except 1/2 tablet on Thursdays. Recheck in 5 weeks. [facility] Clinic [phone]

## 2020-07-12 ENCOUNTER — Ambulatory Visit: Payer: Commercial Managed Care - PPO | Admitting: Cardiovascular Disease

## 2020-07-12 ENCOUNTER — Other Ambulatory Visit (HOSPITAL_COMMUNITY): Payer: Commercial Managed Care - PPO

## 2020-08-18 ENCOUNTER — Encounter: Payer: Self-pay | Admitting: Cardiovascular Disease

## 2020-08-18 ENCOUNTER — Ambulatory Visit (INDEPENDENT_AMBULATORY_CARE_PROVIDER_SITE_OTHER): Payer: Commercial Managed Care - PPO | Admitting: Cardiovascular Disease

## 2020-08-18 ENCOUNTER — Other Ambulatory Visit: Payer: Self-pay

## 2020-08-18 ENCOUNTER — Ambulatory Visit (INDEPENDENT_AMBULATORY_CARE_PROVIDER_SITE_OTHER): Payer: Commercial Managed Care - PPO

## 2020-08-18 ENCOUNTER — Ambulatory Visit (HOSPITAL_COMMUNITY): Payer: Commercial Managed Care - PPO | Attending: Cardiovascular Disease

## 2020-08-18 VITALS — BP 120/80 | HR 57 | Ht 62.0 in | Wt 181.4 lb

## 2020-08-18 DIAGNOSIS — Z952 Presence of prosthetic heart valve: Secondary | ICD-10-CM

## 2020-08-18 DIAGNOSIS — R002 Palpitations: Secondary | ICD-10-CM

## 2020-08-18 DIAGNOSIS — I359 Nonrheumatic aortic valve disorder, unspecified: Secondary | ICD-10-CM | POA: Diagnosis not present

## 2020-08-18 DIAGNOSIS — Z5181 Encounter for therapeutic drug level monitoring: Secondary | ICD-10-CM

## 2020-08-18 DIAGNOSIS — E782 Mixed hyperlipidemia: Secondary | ICD-10-CM | POA: Diagnosis not present

## 2020-08-18 LAB — ECHOCARDIOGRAM COMPLETE
AR max vel: 0.94 cm2
AV Area VTI: 1.1 cm2
AV Area mean vel: 0.99 cm2
AV Mean grad: 32.5 mmHg
AV Peak grad: 63 mmHg
Ao pk vel: 3.97 m/s
Area-P 1/2: 2.5 cm2
S' Lateral: 2 cm

## 2020-08-18 LAB — POCT INR: INR: 2.9 (ref 2.0–3.0)

## 2020-08-18 MED ORDER — AMOXICILLIN 500 MG PO CAPS: ORAL_CAPSULE | ORAL | 11 refills | Status: DC

## 2020-08-18 MED ORDER — PERFLUTREN LIPID MICROSPHERE
1.0000 mL | INTRAVENOUS | Status: AC | PRN
  Administered 2020-08-18: 2 mL via INTRAVENOUS

## 2020-08-18 NOTE — Progress Notes (Signed)
Cardiology Office Note:    Date:  08/18/2020   ID:  Hillery Zachman, DOB [birth year], MRN [id number]  PCP:  Wenda Low, MD  [facility] Cardiologist:  Sherren Mocha, MD  [place] Electrophysiologist:  None   Referring MD: Wenda Low, MD   Chief Complaint  Patient presents with  . Follow-up    Aortic Valve Disease    History of Present Illness:    [name] is a 65 y.o. female with a hx of aortic valve disease, presenting for follow-up evaluation today.  The patient is here alone.  The patient has a history of severe bicuspid aortic valve stenosis and she underwent mechanical aortic valve replacement in 2012 with a 19 mm mechanical bileaflet valve. She has had the elevated transvalvular gradient since surgery and has undergone extensive evaluation including transesophageal echo and CTA studies to evaluate for any leaflet dysfunction. 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   She is doing well at present.  She denies chest pain, shortness of breath, orthopnea, PND, or edema.  She has not been engaged in regular exercise as she has avoided her gym during the COVID-19 pandemic.  She had an episode of heart palpitations back in March 2021 and underwent evaluation at that time.  She was found to have PVCs but no other arrhythmia.  Symptoms quickly resolved without any changes in medical therapy.  She was continued on her beta-blocker.  She is no longer having problems with heart palpitations.  Past Medical History:  Diagnosis Date  . Angina    chest tightness and pressure due to aortic stenosis  . Aortic stenosis   . Diabetes mellitus    newly diagnosed in july, 2012  . GERD (gastroesophageal reflux disease)   . Headache(784.0)    migraines when younger  . Heart murmur    aortic stenosis - dr. Burt Knack is her cardiologist  . Hx of hysterectomy   . Hyperlipidemia   . Multinodular thyroid   . Palpitation    . PONV (postoperative nausea and vomiting)   . Shortness of breath    associated with as    Past Surgical History:  Procedure Laterality Date  . ABDOMINAL HYSTERECTOMY    . AORTIC VALVE REPLACEMENT  06/25/2011   Procedure: AORTIC VALVE REPLACEMENT (AVR);  Surgeon: Tharon Aquas Adelene Idler, MD;  Location: [place];  Service: Open Heart Surgery;  Laterality: N/A;  . BREAST CYST ASPIRATION Left   . BUNIONECTOMY    . CARDIAC VALVE REPLACEMENT     Aortic Valve   06/25/2011  . CESAREAN SECTION    . CYSTOSCOPY WITH RETROGRADE PYELOGRAM, URETEROSCOPY AND STENT PLACEMENT Left 10/21/2014   Procedure: CYSTOSCOPY WITH LEFT RETROGRADE PYELOGRAM, URETEROSCOPY BIOPSY AND STENT PLACEMENT;  Surgeon: Alexis Frock, MD;  Location: [facility];  Service: Urology;  Laterality: Left;  . PARTIAL HYSTERECTOMY    . TEE WITHOUT CARDIOVERSION N/A 09/18/2012   Procedure: TRANSESOPHAGEAL ECHOCARDIOGRAM (TEE);  Surgeon: Fay Records, MD;  Location: [facility];  Service: Cardiovascular;  Laterality: N/A;    Current Medications: Current Meds  Medication Sig  . aspirin 81 MG tablet Take 81 mg by mouth every morning.   . Blood Glucose Monitoring Suppl (ONE TOUCH ULTRA 2) w/Device KIT   . CRESTOR 20 MG tablet TAKE 1 TABLET BY MOUTH ONCE A DAY  . dapagliflozin propanediol (FARXIGA) 10 MG TABS tablet Take 10 mg by mouth daily.  Marland Kitchen glimepiride (AMARYL) 1 MG tablet Take  1 mg by mouth 2 (two) times daily.   . metoprolol tartrate (LOPRESSOR) 25 MG tablet TAKE 1 TABLET TWICE A DAY (BETA BLOCKER)  . omega-3 acid ethyl esters (LOVAZA) 1 g capsule Take 1 g by mouth daily.  Glory Rosebush ULTRA test strip   . warfarin (COUMADIN) 5 MG tablet TAKE AS DIRECTED BY THE COUMADIN CLINIC  . [DISCONTINUED] amoxicillin (AMOXIL) 500 MG capsule      Allergies:   Atorvastatin, Codeine, Morphine and related, Niacin, and Percocet [oxycodone-acetaminophen]   Social History   Socioeconomic History  . Marital status: Married    Spouse name: Not on file   . Number of children: Not on file  . Years of education: Not on file  . Highest education level: Not on file  Occupational History  . Not on file  Tobacco Use  . Smoking status: Former Smoker    Packs/day: 1.00    Years: 15.00    Pack years: 15.00    Types: Cigarettes    Quit date: 08/12/1994    Years since quitting: 26.0  . Smokeless tobacco: Never Used  Substance and Sexual Activity  . Alcohol use: No  . Drug use: No  . Sexual activity: Not on file  Other Topics Concern  . Not on file  Social History Narrative   Married - one daughter 41 y/o   Occupation: Employed as Banker for [employer] Strain: Not on [employer] Insecurity: Not on file  Transportation Needs: Not on file  Physical Activity: Not on file  Stress: Not on file  Social Connections: Not on file     Family History: The patient's family history includes Breast cancer in her paternal aunt; COPD in her father; Cancer (age of onset: 72) in her mother; Coronary artery disease in her father; Diabetes in her mother; Hypertension in an other family member.  ROS:   Please see the history of present illness.    All other systems reviewed and are negative.  EKGs/Labs/Other Studies Reviewed:    The following studies were reviewed today: The patient's echo study from this morning is personally reviewed.  The formal interpretation is pending.  LV function is vigorous by my assessment with LVEF greater than 65%.  Peak transaortic velocity is 4 m/s, peak and mean transvalvular gradients are 64 and 31 mmHg, respectively.  Dimensionless index is 0.33.  These findings are all stable from previous studies.  EKG:  EKG is ordered today.  The ekg ordered today demonstrates sinus bradycardia 57 bpm, within normal limits.  Recent Labs: No results found for requested labs within last 8760 hours.  Recent Lipid Panel    Component Value Date/Time   CHOL 163 03/03/2013 0732   TRIG  238.0 (H) 03/03/2013 0732   HDL 46.20 03/03/2013 0732   CHOLHDL 4 03/03/2013 0732   VLDL 47.6 (H) 03/03/2013 0732   LDLCALC 91 03/18/2011 0743   LDLDIRECT 93.3 03/03/2013 0732          Physical Exam:    VS:  BP 120/80   Pulse (!) 57   Ht _0  (1.575 m)   Wt 181 lb 6.4 oz (82.3 kg)   SpO2 96%   BMI 33.18 kg/m     Wt Readings from Last 3 Encounters:  08/18/20 181 lb 6.4 oz (82.3 kg)  10/20/19 183 lb 12.8 oz (83.4 kg)  07/19/19 186 lb (84.4 kg)     GEN:  Well nourished,  well developed in no acute distress HEENT: Normal NECK: No JVD; No carotid bruits LYMPHATICS: No lymphadenopathy CARDIAC: RRR, 2/6 early peaking systolic murmur at the right upper sternal border, no diastolic murmur RESPIRATORY:  Clear to auscultation without rales, wheezing or rhonchi  ABDOMEN: Soft, non-tender, non-distended MUSCULOSKELETAL:  No edema; No deformity  SKIN: Warm and dry NEUROLOGIC:  Alert and oriented x 3 PSYCHIATRIC:  Normal affect   ASSESSMENT:    1. S/P AVR (aortic valve replacement)   2. Palpitations   3. Mixed hyperlipidemia    PLAN:    In order of problems listed above:  1. Stable findings with significant patient prosthesis mismatch, but no symptoms and no change in gradients over time.  Normal leaflet function has been demonstrated on advanced imaging studies with TEE and CTA. 2. Resolved, continue beta-blocker. 3. Lipids have been excellent on rosuvastatin with last labs demonstrating cholesterol 173 and LDL 71.  She has an upcoming physical when her labs will be rechecked.  Overall the patient remained stable from a cardiovascular perspective.  I will see her back in 1 year with an echocardiogram at that time.        Medication Adjustments/Labs and Tests Ordered: Current medicines are reviewed at length with the patient today.  Concerns regarding medicines are outlined above.  Orders Placed This Encounter  Procedures  . EKG 12-Lead  . ECHOCARDIOGRAM COMPLETE    Meds ordered this encounter  Medications  . amoxicillin (AMOXIL) 500 MG capsule    Sig: Take 2,000 mg (4 capsules) 1 hour prior to all dental visits.    Dispense:  8 capsule    Refill:  11    Patient Instructions  Medication Instructions:  Your provider recommends that you continue on your current medications as directed. Please refer to the Current Medication list given to you today.   *If you need a refill on your cardiac medications before your next appointment, please call your pharmacy*  Follow-Up: You will be called to arrange your 1 year echo and office visit.    Signed, Sherren Mocha, MD  08/18/2020 10:32 AM    [facility]

## 2020-08-18 NOTE — Patient Instructions (Signed)
Medication Instructions:  Your provider recommends that you continue on your current medications as directed. Please refer to the Current Medication list given to you today.   *If you need a refill on your cardiac medications before your next appointment, please call your pharmacy*   Follow-Up: You will be called to arrange your 1 year echo and office visit. 

## 2020-08-18 NOTE — Patient Instructions (Addendum)
  Description   Continue on same dosage of Warfarin 1 tablet daily except 1/2 tablet on Thursdays. Recheck in 6 weeks. Coumadin Clinic [phone]

## 2020-08-26 ENCOUNTER — Other Ambulatory Visit: Payer: Self-pay | Admitting: Cardiovascular Disease

## 2020-08-26 DIAGNOSIS — Z952 Presence of prosthetic heart valve: Secondary | ICD-10-CM

## 2020-08-31 ENCOUNTER — Other Ambulatory Visit (HOSPITAL_COMMUNITY): Payer: Commercial Managed Care - PPO

## 2020-08-31 ENCOUNTER — Ambulatory Visit: Payer: Commercial Managed Care - PPO | Admitting: Cardiovascular Disease

## 2020-09-29 ENCOUNTER — Other Ambulatory Visit: Payer: Self-pay

## 2020-09-29 ENCOUNTER — Ambulatory Visit (INDEPENDENT_AMBULATORY_CARE_PROVIDER_SITE_OTHER): Payer: Commercial Managed Care - PPO | Admitting: *Deleted

## 2020-09-29 DIAGNOSIS — I359 Nonrheumatic aortic valve disorder, unspecified: Secondary | ICD-10-CM | POA: Diagnosis not present

## 2020-09-29 DIAGNOSIS — Z952 Presence of prosthetic heart valve: Secondary | ICD-10-CM | POA: Diagnosis not present

## 2020-09-29 DIAGNOSIS — Z5181 Encounter for therapeutic drug level monitoring: Secondary | ICD-10-CM | POA: Diagnosis not present

## 2020-09-29 LAB — POCT INR: INR: 2.2 (ref 2.0–3.0)

## 2020-09-29 NOTE — Patient Instructions (Signed)
Description   Continue on same dosage of Warfarin 1 tablet daily except 1/2 tablet on Thursdays. Recheck in 6 weeks. Coumadin Clinic [phone]     

## 2020-11-06 ENCOUNTER — Ambulatory Visit
Admission: RE | Admit: 2020-11-06 | Discharge: 2020-11-06 | Disposition: A | Discharge status: Home / Self Care | Payer: Commercial Managed Care - PPO | Source: Ambulatory Visit | Attending: Internal Medicine | Admitting: Internal Medicine

## 2020-11-06 ENCOUNTER — Other Ambulatory Visit: Payer: Self-pay | Admitting: Internal Medicine

## 2020-11-06 DIAGNOSIS — S0990XA Unspecified injury of head, initial encounter: Secondary | ICD-10-CM

## 2020-11-10 ENCOUNTER — Ambulatory Visit (INDEPENDENT_AMBULATORY_CARE_PROVIDER_SITE_OTHER): Payer: Commercial Managed Care - PPO | Admitting: *Deleted

## 2020-11-10 ENCOUNTER — Other Ambulatory Visit: Payer: Self-pay

## 2020-11-10 DIAGNOSIS — Z5181 Encounter for therapeutic drug level monitoring: Secondary | ICD-10-CM | POA: Diagnosis not present

## 2020-11-10 DIAGNOSIS — I359 Nonrheumatic aortic valve disorder, unspecified: Secondary | ICD-10-CM | POA: Diagnosis not present

## 2020-11-10 DIAGNOSIS — Z952 Presence of prosthetic heart valve: Secondary | ICD-10-CM | POA: Diagnosis not present

## 2020-11-10 LAB — POCT INR: INR: 2.3 (ref 2.0–3.0)

## 2020-11-10 NOTE — Patient Instructions (Signed)
Description   Continue on same dosage of Warfarin 1 tablet daily except 1/2 tablet on Thursdays. Recheck in 6 weeks. Coumadin Clinic [phone]

## 2020-11-29 ENCOUNTER — Other Ambulatory Visit: Payer: Self-pay | Admitting: Cardiovascular Disease

## 2020-12-07 ENCOUNTER — Encounter: Payer: Self-pay | Admitting: Gastroenterology

## 2020-12-22 ENCOUNTER — Ambulatory Visit (INDEPENDENT_AMBULATORY_CARE_PROVIDER_SITE_OTHER): Payer: Commercial Managed Care - PPO | Admitting: *Deleted

## 2020-12-22 ENCOUNTER — Other Ambulatory Visit: Payer: Self-pay

## 2020-12-22 DIAGNOSIS — I359 Nonrheumatic aortic valve disorder, unspecified: Secondary | ICD-10-CM

## 2020-12-22 DIAGNOSIS — Z5181 Encounter for therapeutic drug level monitoring: Secondary | ICD-10-CM

## 2020-12-22 DIAGNOSIS — Z952 Presence of prosthetic heart valve: Secondary | ICD-10-CM | POA: Diagnosis not present

## 2020-12-22 LAB — POCT INR: INR: 2.7 (ref 2.0–3.0)

## 2020-12-22 NOTE — Patient Instructions (Addendum)
Description   Continue taking Warfarin 1 tablet daily except 1/2 tablet on Thursdays. Recheck in 6 weeks. Coumadin Clinic #[phone] Main Fax #[phone]     

## 2021-01-24 ENCOUNTER — Telehealth: Payer: Self-pay | Admitting: Cardiovascular Disease

## 2021-01-24 NOTE — Telephone Encounter (Signed)
   [place] Pre-operative Risk Assessment    Patient Name: Breeona Waid  DOB: 12/30/1955  MRN: [id number]   HEARTCARE STAFF: - Please ensure there is not already an duplicate clearance open for this procedure. - Under Visit Info/Reason for Call, type in Other and utilize the format Clearance MM/DD/YY or Clearance TBD. Do not use dashes or single digits. - If request is for dental extraction, please clarify the # of teeth to be extracted. - If the patient is currently at the dentist's office, call Pre-Op APP to address. If the patient is not currently in the dentist office, please route to the Pre-Op pool  Request for surgical clearance:  What type of surgery is being performed? Routine cleaning   When is this surgery scheduled? 01/24/21. Patient is at the office now   What type of clearance is required (medical clearance vs. Pharmacy clearance to hold med vs. Both)? Both  Are there any medications that need to be held prior to surgery and how long?  Practice name and name of physician performing surgery? [facility]    What is the office phone number? [phone]   7.   What is the office fax number?   8.   Anesthesia type (None, local, MAC, general) ?    Johnna Acosta 01/24/2021, 3:08 PM  _________________________________________________________________   (provider comments below)

## 2021-02-08 ENCOUNTER — Ambulatory Visit (INDEPENDENT_AMBULATORY_CARE_PROVIDER_SITE_OTHER): Payer: Commercial Managed Care - PPO | Admitting: *Deleted

## 2021-02-08 ENCOUNTER — Other Ambulatory Visit: Payer: Self-pay

## 2021-02-08 DIAGNOSIS — Z5181 Encounter for therapeutic drug level monitoring: Secondary | ICD-10-CM | POA: Diagnosis not present

## 2021-02-08 DIAGNOSIS — Z952 Presence of prosthetic heart valve: Secondary | ICD-10-CM

## 2021-02-08 DIAGNOSIS — I359 Nonrheumatic aortic valve disorder, unspecified: Secondary | ICD-10-CM | POA: Diagnosis not present

## 2021-02-08 LAB — POCT INR: INR: 2.9 (ref 2.0–3.0)

## 2021-02-08 NOTE — Patient Instructions (Signed)
Description   Continue taking Warfarin 1 tablet daily except 1/2 tablet on Thursdays. Recheck in 6 weeks. Coumadin Clinic [phone] Main Fax [phone]

## 2021-03-07 ENCOUNTER — Telehealth: Payer: Self-pay

## 2021-03-07 NOTE — Telephone Encounter (Signed)
Per Dr. Burt Knack, arranged 1 year echo and office visit 08/15/2021. The patient was grateful for call and agrees with plan.

## 2021-03-22 ENCOUNTER — Ambulatory Visit (INDEPENDENT_AMBULATORY_CARE_PROVIDER_SITE_OTHER): Payer: Commercial Managed Care - PPO | Admitting: *Deleted

## 2021-03-22 ENCOUNTER — Other Ambulatory Visit: Payer: Self-pay

## 2021-03-22 DIAGNOSIS — Z952 Presence of prosthetic heart valve: Secondary | ICD-10-CM

## 2021-03-22 DIAGNOSIS — I359 Nonrheumatic aortic valve disorder, unspecified: Secondary | ICD-10-CM | POA: Diagnosis not present

## 2021-03-22 DIAGNOSIS — Z5181 Encounter for therapeutic drug level monitoring: Secondary | ICD-10-CM | POA: Diagnosis not present

## 2021-03-22 LAB — POCT INR: INR: 2.7 (ref 2.0–3.0)

## 2021-03-22 NOTE — Patient Instructions (Signed)
Description   Continue taking Warfarin 1 tablet daily except 1/2 tablet on Thursdays. Recheck in 6 weeks. Coumadin Clinic [phone] Main Fax [phone]

## 2021-04-24 ENCOUNTER — Other Ambulatory Visit: Payer: Self-pay | Admitting: Internal Medicine

## 2021-04-24 DIAGNOSIS — Z1231 Encounter for screening mammogram for malignant neoplasm of breast: Secondary | ICD-10-CM

## 2021-05-03 ENCOUNTER — Other Ambulatory Visit: Payer: Self-pay

## 2021-05-03 ENCOUNTER — Ambulatory Visit (INDEPENDENT_AMBULATORY_CARE_PROVIDER_SITE_OTHER): Payer: Commercial Managed Care - PPO

## 2021-05-03 DIAGNOSIS — Z952 Presence of prosthetic heart valve: Secondary | ICD-10-CM

## 2021-05-03 DIAGNOSIS — Z5181 Encounter for therapeutic drug level monitoring: Secondary | ICD-10-CM

## 2021-05-03 DIAGNOSIS — I359 Nonrheumatic aortic valve disorder, unspecified: Secondary | ICD-10-CM | POA: Diagnosis not present

## 2021-05-03 LAB — POCT INR: INR: 2.5 (ref 2.0–3.0)

## 2021-05-03 NOTE — Patient Instructions (Signed)
Description   Continue taking Warfarin 1 tablet daily except 1/2 tablet on Thursdays. Recheck in 6 weeks. Coumadin Clinic [phone] Main Fax [phone]

## 2021-06-07 ENCOUNTER — Ambulatory Visit
Admission: RE | Admit: 2021-06-07 | Discharge: 2021-06-07 | Disposition: A | Discharge status: Home / Self Care | Payer: Commercial Managed Care - PPO | Source: Ambulatory Visit | Attending: Internal Medicine | Admitting: Internal Medicine

## 2021-06-07 DIAGNOSIS — Z1231 Encounter for screening mammogram for malignant neoplasm of breast: Secondary | ICD-10-CM

## 2021-06-15 ENCOUNTER — Other Ambulatory Visit: Payer: Self-pay

## 2021-06-15 ENCOUNTER — Ambulatory Visit (INDEPENDENT_AMBULATORY_CARE_PROVIDER_SITE_OTHER): Payer: Commercial Managed Care - PPO

## 2021-06-15 DIAGNOSIS — Z5181 Encounter for therapeutic drug level monitoring: Secondary | ICD-10-CM

## 2021-06-15 DIAGNOSIS — I359 Nonrheumatic aortic valve disorder, unspecified: Secondary | ICD-10-CM | POA: Diagnosis not present

## 2021-06-15 DIAGNOSIS — Z952 Presence of prosthetic heart valve: Secondary | ICD-10-CM

## 2021-06-15 LAB — POCT INR: INR: 2.6 (ref 2.0–3.0)

## 2021-06-15 NOTE — Patient Instructions (Signed)
Description   Continue taking Warfarin 1 tablet daily except 1/2 tablet on Thursdays. Recheck in 6 weeks. Coumadin Clinic [phone] Main Fax [phone]

## 2021-06-20 ENCOUNTER — Other Ambulatory Visit: Payer: Self-pay | Admitting: Internal Medicine

## 2021-06-20 DIAGNOSIS — Z87891 Personal history of nicotine dependence: Secondary | ICD-10-CM

## 2021-07-12 ENCOUNTER — Other Ambulatory Visit: Payer: Self-pay

## 2021-07-12 ENCOUNTER — Ambulatory Visit
Admission: RE | Admit: 2021-07-12 | Discharge: 2021-07-12 | Disposition: A | Discharge status: Home / Self Care | Payer: Commercial Managed Care - PPO | Source: Ambulatory Visit | Attending: Internal Medicine | Admitting: Internal Medicine

## 2021-07-12 ENCOUNTER — Ambulatory Visit: Payer: Commercial Managed Care - PPO

## 2021-07-12 DIAGNOSIS — Z87891 Personal history of nicotine dependence: Secondary | ICD-10-CM

## 2021-07-25 ENCOUNTER — Other Ambulatory Visit: Payer: Self-pay

## 2021-07-25 ENCOUNTER — Ambulatory Visit (INDEPENDENT_AMBULATORY_CARE_PROVIDER_SITE_OTHER): Payer: Commercial Managed Care - PPO | Admitting: *Deleted

## 2021-07-25 DIAGNOSIS — I359 Nonrheumatic aortic valve disorder, unspecified: Secondary | ICD-10-CM

## 2021-07-25 DIAGNOSIS — Z952 Presence of prosthetic heart valve: Secondary | ICD-10-CM

## 2021-07-25 DIAGNOSIS — Z5181 Encounter for therapeutic drug level monitoring: Secondary | ICD-10-CM | POA: Diagnosis not present

## 2021-07-25 LAB — POCT INR: INR: 3.1 — AB (ref 2.0–3.0)

## 2021-07-25 NOTE — Patient Instructions (Signed)
Description   Today take 1/2 tablet then continue taking Warfarin 1 tablet daily except 1/2 tablet on Thursdays. Recheck in 3 weeks while here with Dr Burt Knack & Echo Appt. Coumadin Clinic [phone] Main Fax [phone]

## 2021-08-07 ENCOUNTER — Other Ambulatory Visit: Payer: Self-pay | Admitting: Cardiovascular Disease

## 2021-08-07 DIAGNOSIS — Z952 Presence of prosthetic heart valve: Secondary | ICD-10-CM

## 2021-08-15 ENCOUNTER — Encounter: Payer: Self-pay | Admitting: Cardiovascular Disease

## 2021-08-15 ENCOUNTER — Ambulatory Visit (INDEPENDENT_AMBULATORY_CARE_PROVIDER_SITE_OTHER): Payer: BC Managed Care – PPO

## 2021-08-15 ENCOUNTER — Other Ambulatory Visit: Payer: Self-pay

## 2021-08-15 ENCOUNTER — Ambulatory Visit (INDEPENDENT_AMBULATORY_CARE_PROVIDER_SITE_OTHER): Payer: BC Managed Care – PPO | Admitting: Cardiovascular Disease

## 2021-08-15 ENCOUNTER — Ambulatory Visit (HOSPITAL_COMMUNITY): Payer: BC Managed Care – PPO | Attending: Cardiology

## 2021-08-15 VITALS — BP 130/72 | HR 67 | Ht 62.0 in | Wt 184.0 lb

## 2021-08-15 DIAGNOSIS — Z952 Presence of prosthetic heart valve: Secondary | ICD-10-CM | POA: Diagnosis not present

## 2021-08-15 DIAGNOSIS — E782 Mixed hyperlipidemia: Secondary | ICD-10-CM

## 2021-08-15 DIAGNOSIS — Z5181 Encounter for therapeutic drug level monitoring: Secondary | ICD-10-CM

## 2021-08-15 DIAGNOSIS — I359 Nonrheumatic aortic valve disorder, unspecified: Secondary | ICD-10-CM

## 2021-08-15 LAB — ECHOCARDIOGRAM COMPLETE
AR max vel: 0.69 cm2
AV Area VTI: 0.83 cm2
AV Area mean vel: 0.66 cm2
AV Mean grad: 39 mmHg
AV Peak grad: 63.7 mmHg
Ao pk vel: 3.99 m/s
Area-P 1/2: 2.56 cm2
S' Lateral: 2.8 cm

## 2021-08-15 LAB — POCT INR: INR: 2.9 (ref 2.0–3.0)

## 2021-08-15 NOTE — Progress Notes (Signed)
Cardiology Office Note:    Date:  08/15/2021   ID:  [name], DOB 08-Sep-1955, MRN [id number]  PCP:  Wenda Low, MD   [facility] Providers Cardiologist:  Sherren Mocha, MD     Referring MD: Wenda Low, MD   Chief Complaint  Patient presents with   Aortic Stenosis    History of Present Illness:    [name] is a 66 y.o. female with a hx of aortic valve disease, presenting for follow-up evaluation today.  The patient is here alone.   The patient has a history of severe bicuspid aortic valve stenosis and she underwent mechanical aortic valve replacement in 2012 with a 19 mm mechanical bileaflet valve.  She has had the elevated transvalvular gradient since surgery and has undergone extensive evaluation including transesophageal echo and CTA studies to evaluate for any leaflet dysfunction.  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    The patient is here alone today.  She is doing well with no symptoms of chest pain, chest pressure, or shortness of breath.  She is active with her 36 young grandchildren and has no physical limitation.  She denies heart palpitations lightheadedness, or frank syncope.  Past Medical History:  Diagnosis Date   Angina    chest tightness and pressure due to aortic stenosis   Aortic stenosis    Diabetes mellitus    newly diagnosed in july, 2012   GERD (gastroesophageal reflux disease)    Headache(784.0)    migraines when younger   Heart murmur    aortic stenosis - dr. Burt Knack is her cardiologist   Hx of hysterectomy    Hyperlipidemia    Multinodular thyroid    Palpitation    PONV (postoperative nausea and vomiting)    Shortness of breath    associated with as    Past Surgical History:  Procedure Laterality Date   ABDOMINAL HYSTERECTOMY     AORTIC VALVE REPLACEMENT  06/25/2011   Procedure: AORTIC VALVE REPLACEMENT (AVR);  Surgeon: Tharon Aquas Adelene Idler, MD;  Location: [place];  Service: Open Heart Surgery;  Laterality: N/A;   BREAST CYST ASPIRATION Left 2007   BUNIONECTOMY     CARDIAC VALVE REPLACEMENT     Aortic Valve   06/25/2011   CESAREAN SECTION     CYSTOSCOPY WITH RETROGRADE PYELOGRAM, URETEROSCOPY AND STENT PLACEMENT Left 10/21/2014   Procedure: CYSTOSCOPY WITH LEFT RETROGRADE PYELOGRAM, URETEROSCOPY BIOPSY AND STENT PLACEMENT;  Surgeon: Alexis Frock, MD;  Location: [facility];  Service: Urology;  Laterality: Left;   PARTIAL HYSTERECTOMY     TEE WITHOUT CARDIOVERSION N/A 09/18/2012   Procedure: TRANSESOPHAGEAL ECHOCARDIOGRAM (TEE);  Surgeon: Fay Records, MD;  Location: [facility];  Service: Cardiovascular;  Laterality: N/A;    Current Medications: Current Meds  Medication Sig   amoxicillin (AMOXIL) 500 MG capsule Take 2,000 mg (4 capsules) 1 hour prior to all dental visits.   aspirin 81 MG tablet Take 81 mg by mouth every morning.    Blood Glucose Monitoring Suppl (ONE TOUCH ULTRA 2) w/Device KIT    CRESTOR 20 MG tablet TAKE 1 TABLET BY MOUTH ONCE A DAY   dapagliflozin propanediol (FARXIGA) 10 MG TABS tablet Take 10 mg by mouth daily.   glimepiride (AMARYL) 1 MG tablet Take 1 mg by mouth 2 (two) times daily.    metoprolol tartrate (LOPRESSOR) 25 MG tablet TAKE 1 TABLET TWICE A DAY (BETA BLOCKER)   omega-3 acid ethyl esters (  LOVAZA) 1 g capsule Take 1 g by mouth daily.   ONETOUCH ULTRA test strip    warfarin (COUMADIN) 5 MG tablet TAKE AS DIRECTED BY THE COUMADIN CLINIC     Allergies:   Atorvastatin, Codeine, Metformin and related, Morphine and related, Niacin, and Percocet [oxycodone-acetaminophen]   Social History   Socioeconomic History   Marital status: Married    Spouse name: Not on file   Number of children: Not on file   Years of education: Not on file   Highest education level: Not on file  Occupational History   Not on file  Tobacco Use   Smoking status: Former    Packs/day: 1.00    Years: 15.00    Pack years: 15.00     Types: Cigarettes    Quit date: 08/12/1994    Years since quitting: 27.0   Smokeless tobacco: Never  Substance and Sexual Activity   Alcohol use: No   Drug use: No   Sexual activity: Not on file  Other Topics Concern   Not on file  Social History Narrative   Married - one daughter 39 y/o   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  ROS:   Please see the history of present illness.    All other systems reviewed and are negative.  EKGs/Labs/Other Studies Reviewed:    The following studies were reviewed today: Today's 2D echo images are reviewed.  The formal interpretation is currently pending.  LV function is vigorous with LVEF greater than 65%.  There are no regional wall motion abnormalities appreciated.  The mechanical aortic valve prosthesis is poorly visualized but demonstrates stable findings with a peak and mean gradient of 66 and 37 mmHg, dimensionless index 0.27.  EKG:  EKG is ordered today.  The ekg ordered today demonstrates NSR 67 bpm, within normal limits  Recent Labs: No results found for requested labs within last 8760 hours.  Recent Lipid Panel    Component Value Date/Time   CHOL 163 03/03/2013 0732   TRIG 238.0 (H) 03/03/2013 0732   HDL 46.20 03/03/2013 0732   CHOLHDL 4 03/03/2013 0732   VLDL 47.6 (H) 03/03/2013 0732   LDLCALC 91 03/18/2011 0743   LDLDIRECT 93.3 03/03/2013 0732     Risk Assessment/Calculations:           Physical Exam:    VS:  BP 130/72    Pulse 67    Ht 5'  2" (1.575 m)    Wt 184 lb (83.5 kg)    SpO2 95%    BMI 33.65 kg/m     Wt Readings from Last 3 Encounters:  08/15/21 184 lb (83.5 kg)  08/18/20 181 lb 6.4 oz (82.3 kg)  10/20/19 183 lb 12.8 oz (83.4 kg)     GEN:  Well nourished, well developed in no acute distress HEENT: Normal NECK: No JVD; No carotid bruits LYMPHATICS: No lymphadenopathy CARDIAC: RRR, 2/6 early peaking systolic murmur at the right upper sternal border RESPIRATORY:  Clear  to auscultation without rales, wheezing or rhonchi  ABDOMEN: Soft, non-tender, non-distended MUSCULOSKELETAL:  No edema; No deformity  SKIN: Warm and dry NEUROLOGIC:  Alert and oriented x 3 PSYCHIATRIC:  Normal affect   ASSESSMENT:    1. S/P AVR (aortic valve replacement)   2. Mixed hyperlipidemia    PLAN:    In order of problems listed above:  The patient is clinically stable with no cardiac-related symptoms at present (New York Heart Association functional class I).  She has elevated transaortic gradients chronically over time related to high flow and patient prosthesis mismatch with a small 19 mm mechanical prosthesis.  Fortunately she continues to do well clinically.  I will see her back in 1 year with an echocardiogram.  She follows SBE prophylaxis guidelines.  She will remain on low-dose aspirin and warfarin for anticoagulation.  The patient understands that the guidelines have changed for anticoagulation therapy after mechanical aortic valve replacement and that aspirin is no longer mandatory.  However, with her elevated gradients and good tolerance of aspirin, I have recommended that she continue on it along with the warfarin for now. Treated with rosuvastatin 20 mg daily.  Last lipids showed a cholesterol 199, HDL 47, LDL 106.  ALT was normal at 10.           Medication Adjustments/Labs and Tests Ordered: Current medicines are reviewed at length with the patient today.  Concerns regarding medicines are outlined above.  Orders Placed  This Encounter  Procedures   EKG 12-Lead   ECHOCARDIOGRAM COMPLETE   No orders of the defined types were placed in this encounter.   Patient Instructions  Medication Instructions:  Your physician recommends that you continue on your current medications as directed. Please refer to the Current Medication list given to you today.  *If you need a refill on your cardiac medications before your next appointment, please call your pharmacy*  Testing/Procedures: Your physician has requested that you have an echocardiogram in one year. Echocardiography is a painless test that uses sound waves to create images of your heart. It provides your doctor with information about the size and shape of your heart and how well your hearts chambers and valves are working. This procedure takes approximately one hour. There are no restrictions for this procedure.  Follow-Up: At [facility], you and your health needs are our priority.  As part of our continuing mission to provide you with exceptional heart care, we have created designated Provider Care Teams.  These Care Teams include your primary Cardiologist (physician) and Advanced Practice Providers (APPs -  Physician Assistants and Nurse Practitioners) who all work together to provide you with the care you need, when you need it.  Your next appointment:   1 year(s)  The format for your next appointment:   In Person  Provider:   Sherren Mocha, MD      Signed, Sherren Mocha, MD  08/15/2021 5:46 PM    [place]

## 2021-08-15 NOTE — Patient Instructions (Signed)
Description   Continue taking Warfarin 1 tablet daily except 1/2 tablet on Thursdays. Recheck in 5 weeks. Coumadin Clinic [phone] Main Fax [phone]

## 2021-08-15 NOTE — Patient Instructions (Signed)
Medication Instructions:  Your physician recommends that you continue on your current medications as directed. Please refer to the Current Medication list given to you today.  *If you need a refill on your cardiac medications before your next appointment, please call your pharmacy*  Testing/Procedures: Your physician has requested that you have an echocardiogram in one year. Echocardiography is a painless test that uses sound waves to create images of your heart. It provides your doctor with information about the size and shape of your heart and how well your hearts chambers and valves are working. This procedure takes approximately one hour. There are no restrictions for this procedure.  Follow-Up: At [facility], you and your health needs are our priority.  As part of our continuing mission to provide you with exceptional heart care, we have created designated Provider Care Teams.  These Care Teams include your primary Cardiologist (physician) and Advanced Practice Providers (APPs -  Physician Assistants and Nurse Practitioners) who all work together to provide you with the care you need, when you need it.  Your next appointment:   1 year(s)  The format for your next appointment:   In Person  Provider:   Sherren Mocha, MD

## 2021-09-19 ENCOUNTER — Ambulatory Visit (INDEPENDENT_AMBULATORY_CARE_PROVIDER_SITE_OTHER): Payer: BC Managed Care – PPO | Admitting: *Deleted

## 2021-09-19 ENCOUNTER — Other Ambulatory Visit: Payer: Self-pay

## 2021-09-19 DIAGNOSIS — Z5181 Encounter for therapeutic drug level monitoring: Secondary | ICD-10-CM

## 2021-09-19 DIAGNOSIS — I359 Nonrheumatic aortic valve disorder, unspecified: Secondary | ICD-10-CM

## 2021-09-19 DIAGNOSIS — Z952 Presence of prosthetic heart valve: Secondary | ICD-10-CM | POA: Diagnosis not present

## 2021-09-19 LAB — POCT INR: INR: 3.1 — AB (ref 2.0–3.0)

## 2021-09-19 NOTE — Patient Instructions (Signed)
Description   Today take 1/2 tablet then continue taking Warfarin 1 tablet daily except 1/2 tablet on Thursdays. Recheck in 5 weeks. Coumadin Clinic [phone] Main Fax [phone]

## 2021-10-24 ENCOUNTER — Ambulatory Visit (INDEPENDENT_AMBULATORY_CARE_PROVIDER_SITE_OTHER): Payer: BC Managed Care – PPO

## 2021-10-24 ENCOUNTER — Other Ambulatory Visit: Payer: Self-pay

## 2021-10-24 DIAGNOSIS — Z952 Presence of prosthetic heart valve: Secondary | ICD-10-CM

## 2021-10-24 DIAGNOSIS — I359 Nonrheumatic aortic valve disorder, unspecified: Secondary | ICD-10-CM

## 2021-10-24 DIAGNOSIS — Z5181 Encounter for therapeutic drug level monitoring: Secondary | ICD-10-CM

## 2021-10-24 LAB — POCT INR: INR: 5.1 — AB (ref 2.0–3.0)

## 2021-10-24 NOTE — Patient Instructions (Signed)
Description   ?Eat greens, hold today's dose and tomorrow's dose and then continue taking Warfarin 1 tablet daily except 1/2 tablet on Thursdays. ?Recheck in 1 weeks.  ?Coumadin Clinic [phone] Main Fax [phone] ?  ?   ?

## 2021-11-01 ENCOUNTER — Other Ambulatory Visit: Payer: Self-pay

## 2021-11-01 ENCOUNTER — Ambulatory Visit (INDEPENDENT_AMBULATORY_CARE_PROVIDER_SITE_OTHER): Payer: BC Managed Care – PPO | Admitting: *Deleted

## 2021-11-01 DIAGNOSIS — Z952 Presence of prosthetic heart valve: Secondary | ICD-10-CM

## 2021-11-01 DIAGNOSIS — I359 Nonrheumatic aortic valve disorder, unspecified: Secondary | ICD-10-CM

## 2021-11-01 DIAGNOSIS — Z5181 Encounter for therapeutic drug level monitoring: Secondary | ICD-10-CM | POA: Diagnosis not present

## 2021-11-01 LAB — POCT INR: INR: 2.6 (ref 2.0–3.0)

## 2021-11-01 NOTE — Patient Instructions (Signed)
Description   ?Continue taking Warfarin 1 tablet daily except 1/2 tablet on Thursdays. Recheck in 3 weeks.  ?Coumadin Clinic [phone] Main Fax [phone] ?  ?  ?

## 2021-11-12 ENCOUNTER — Other Ambulatory Visit: Payer: Self-pay | Admitting: Cardiovascular Disease

## 2021-11-22 ENCOUNTER — Ambulatory Visit (INDEPENDENT_AMBULATORY_CARE_PROVIDER_SITE_OTHER): Payer: BC Managed Care – PPO

## 2021-11-22 DIAGNOSIS — Z952 Presence of prosthetic heart valve: Secondary | ICD-10-CM | POA: Diagnosis not present

## 2021-11-22 DIAGNOSIS — Z5181 Encounter for therapeutic drug level monitoring: Secondary | ICD-10-CM | POA: Diagnosis not present

## 2021-11-22 DIAGNOSIS — I359 Nonrheumatic aortic valve disorder, unspecified: Secondary | ICD-10-CM

## 2021-11-22 LAB — POCT INR: INR: 2.2 (ref 2.0–3.0)

## 2021-11-22 NOTE — Patient Instructions (Signed)
Description   ?Take 1 tablet today since you missed Tuesday's dose and then continue taking Warfarin 1 tablet daily except 1/2 tablet on Thursdays.  ?Recheck in 4 weeks.  ?Coumadin Clinic [phone] Main Fax [phone] ?  ?   ?

## 2021-12-10 ENCOUNTER — Other Ambulatory Visit: Payer: Self-pay | Admitting: Cardiovascular Disease

## 2021-12-10 DIAGNOSIS — Z952 Presence of prosthetic heart valve: Secondary | ICD-10-CM | POA: Diagnosis not present

## 2021-12-10 DIAGNOSIS — E7800 Pure hypercholesterolemia, unspecified: Secondary | ICD-10-CM | POA: Diagnosis not present

## 2021-12-10 DIAGNOSIS — E1169 Type 2 diabetes mellitus with other specified complication: Secondary | ICD-10-CM | POA: Diagnosis not present

## 2021-12-10 DIAGNOSIS — I700 Atherosclerosis of aorta: Secondary | ICD-10-CM | POA: Diagnosis not present

## 2021-12-21 ENCOUNTER — Ambulatory Visit (INDEPENDENT_AMBULATORY_CARE_PROVIDER_SITE_OTHER): Payer: BC Managed Care – PPO | Admitting: *Deleted

## 2021-12-21 DIAGNOSIS — Z5181 Encounter for therapeutic drug level monitoring: Secondary | ICD-10-CM

## 2021-12-21 DIAGNOSIS — Z952 Presence of prosthetic heart valve: Secondary | ICD-10-CM

## 2021-12-21 DIAGNOSIS — I359 Nonrheumatic aortic valve disorder, unspecified: Secondary | ICD-10-CM

## 2021-12-21 LAB — POCT INR: INR: 2.5 (ref 2.0–3.0)

## 2021-12-21 NOTE — Patient Instructions (Addendum)
Description   Continue taking Warfarin 1 tablet daily except 1/2 tablet on Thursdays. Recheck in  6 weeks. Coumadin Clinic #[phone] or [phone] Main Fax #[phone]     

## 2021-12-26 DIAGNOSIS — C44519 Basal cell carcinoma of skin of other part of trunk: Secondary | ICD-10-CM | POA: Diagnosis not present

## 2022-02-01 ENCOUNTER — Ambulatory Visit (INDEPENDENT_AMBULATORY_CARE_PROVIDER_SITE_OTHER): Payer: BC Managed Care – PPO

## 2022-02-01 DIAGNOSIS — Z952 Presence of prosthetic heart valve: Secondary | ICD-10-CM | POA: Diagnosis not present

## 2022-02-01 DIAGNOSIS — Z5181 Encounter for therapeutic drug level monitoring: Secondary | ICD-10-CM

## 2022-02-01 DIAGNOSIS — I359 Nonrheumatic aortic valve disorder, unspecified: Secondary | ICD-10-CM | POA: Diagnosis not present

## 2022-02-01 LAB — POCT INR: INR: 2.8 (ref 2.0–3.0)

## 2022-02-18 ENCOUNTER — Other Ambulatory Visit: Payer: Self-pay | Admitting: Cardiovascular Disease

## 2022-02-18 DIAGNOSIS — Z952 Presence of prosthetic heart valve: Secondary | ICD-10-CM

## 2022-02-19 NOTE — Telephone Encounter (Signed)
Received refill request for warfarin:  Last INR was 2.8 on 02/01/22 Next INR due on 03/15/22 LOV was 08/15/21  Ezzie Dural MD  Refill approved

## 2022-03-04 DIAGNOSIS — H1132 Conjunctival hemorrhage, left eye: Secondary | ICD-10-CM | POA: Diagnosis not present

## 2022-03-15 ENCOUNTER — Ambulatory Visit (INDEPENDENT_AMBULATORY_CARE_PROVIDER_SITE_OTHER): Payer: BC Managed Care – PPO

## 2022-03-15 DIAGNOSIS — Z952 Presence of prosthetic heart valve: Secondary | ICD-10-CM

## 2022-03-15 DIAGNOSIS — Z5181 Encounter for therapeutic drug level monitoring: Secondary | ICD-10-CM | POA: Diagnosis not present

## 2022-03-15 DIAGNOSIS — I359 Nonrheumatic aortic valve disorder, unspecified: Secondary | ICD-10-CM

## 2022-03-15 LAB — POCT INR: INR: 3 (ref 2.0–3.0)

## 2022-03-15 NOTE — Patient Instructions (Signed)
Continue taking Warfarin 1 tablet daily except 1/2 tablet on Thursdays. Recheck in 6 weeks.  [facility] [phone] or [phone] Main Fax [phone]

## 2022-04-23 DIAGNOSIS — L820 Inflamed seborrheic keratosis: Secondary | ICD-10-CM | POA: Diagnosis not present

## 2022-04-23 DIAGNOSIS — D225 Melanocytic nevi of trunk: Secondary | ICD-10-CM | POA: Diagnosis not present

## 2022-04-23 DIAGNOSIS — L814 Other melanin hyperpigmentation: Secondary | ICD-10-CM | POA: Diagnosis not present

## 2022-04-23 DIAGNOSIS — L304 Erythema intertrigo: Secondary | ICD-10-CM | POA: Diagnosis not present

## 2022-04-25 ENCOUNTER — Ambulatory Visit: Payer: BC Managed Care – PPO | Attending: Cardiovascular Disease

## 2022-04-25 DIAGNOSIS — Z5181 Encounter for therapeutic drug level monitoring: Secondary | ICD-10-CM

## 2022-04-25 DIAGNOSIS — Z952 Presence of prosthetic heart valve: Secondary | ICD-10-CM

## 2022-04-25 DIAGNOSIS — I359 Nonrheumatic aortic valve disorder, unspecified: Secondary | ICD-10-CM

## 2022-04-25 LAB — POCT INR: INR: 3.3 — AB (ref 2.0–3.0)

## 2022-04-25 NOTE — Patient Instructions (Signed)
Description   Hold today's dose and then continue taking Warfarin 1 tablet daily except 1/2 tablet on Thursdays. Recheck in 6 weeks.  [facility] [phone] or [phone] Main Fax [phone]

## 2022-05-08 DIAGNOSIS — I700 Atherosclerosis of aorta: Secondary | ICD-10-CM | POA: Diagnosis not present

## 2022-05-08 DIAGNOSIS — Z23 Encounter for immunization: Secondary | ICD-10-CM | POA: Diagnosis not present

## 2022-05-08 DIAGNOSIS — E1169 Type 2 diabetes mellitus with other specified complication: Secondary | ICD-10-CM | POA: Diagnosis not present

## 2022-05-08 DIAGNOSIS — E559 Vitamin D deficiency, unspecified: Secondary | ICD-10-CM | POA: Diagnosis not present

## 2022-05-08 DIAGNOSIS — E7800 Pure hypercholesterolemia, unspecified: Secondary | ICD-10-CM | POA: Diagnosis not present

## 2022-05-08 DIAGNOSIS — E782 Mixed hyperlipidemia: Secondary | ICD-10-CM | POA: Diagnosis not present

## 2022-05-08 DIAGNOSIS — Z5181 Encounter for therapeutic drug level monitoring: Secondary | ICD-10-CM | POA: Diagnosis not present

## 2022-05-08 DIAGNOSIS — Z0000 Encounter for general adult medical examination without abnormal findings: Secondary | ICD-10-CM | POA: Diagnosis not present

## 2022-05-08 DIAGNOSIS — Z7984 Long term (current) use of oral hypoglycemic drugs: Secondary | ICD-10-CM | POA: Diagnosis not present

## 2022-05-09 ENCOUNTER — Other Ambulatory Visit: Payer: Self-pay | Admitting: Internal Medicine

## 2022-05-09 DIAGNOSIS — Z1231 Encounter for screening mammogram for malignant neoplasm of breast: Secondary | ICD-10-CM

## 2022-06-03 DIAGNOSIS — R002 Palpitations: Secondary | ICD-10-CM | POA: Insufficient documentation

## 2022-06-03 NOTE — Progress Notes (Signed)
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 A bileaflet 19 mm mechanical valve sits well in the aortic position. The leaflets have full range of motion and there is no obvious pannus or a thrombus seen on the leaflets. There is no LVOT obstruction in systole and no subvalvular membrane.   History of Present Illness:   [name] is a 66 y.o. female with the above problem list.  She was last seen by Dr. Burt Knack in Jan 2023. She returns for evaluation of palpitations.  She is here alone.  She has noted palpitations off-and-on in the past.  Recently, she has noted worsening palpitations.  This past weekend was particularly worse.  She felt flip-flopping but no rapid palpitations.  At one point, she felt nauseated and sweaty.  She has had chest discomfort since her valve surgery.  This is chronic without change.  This past weekend with palpitations, she was somewhat short of breath with going up steps.  Otherwise, she has not had shortness of breath.  She has not had orthopnea, PND or leg edema.  She has not had syncope.  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  She brought in a strip of what she was experiencing at home.  She had several PVCs noted.  There was no evidence of  atrial fibrillation.    Past Medical History:  Diagnosis Date   Angina    chest tightness and pressure due to aortic stenosis   Aortic stenosis    Diabetes mellitus    newly diagnosed in july, 2012   GERD (gastroesophageal reflux disease)    Headache(784.0)    migraines when younger   Heart murmur    aortic stenosis - dr. Burt Knack is her cardiologist   Hx of hysterectomy    Hyperlipidemia    Multinodular thyroid    Palpitation    PONV (postoperative nausea and vomiting)    Shortness of breath    associated with as   Current Medications: Current Meds  Medication Sig   amoxicillin (AMOXIL) 500 MG capsule TAKE 4 CAPSULES BY MOUTH 1 HOUR PRIOR TO ALL DENTAL VISITS   aspirin 81 MG tablet Take 81 mg by mouth every morning.    Blood Glucose Monitoring Suppl (ONE TOUCH ULTRA 2) w/Device KIT    CRESTOR 20 MG tablet TAKE 1 TABLET BY MOUTH ONCE A DAY   dapagliflozin propanediol (FARXIGA) 10 MG TABS tablet Take 10 mg by mouth daily.   Dulaglutide (TRULICITY) 0.34 VQ/2.5ZD SOPN Inject 0.75 mg into the skin.   glimepiride (AMARYL) 1 MG tablet Take 1 mg by mouth 2 (two) times daily.   metoprolol tartrate (LOPRESSOR) 25 MG tablet TAKE 1 TABLET TWICE A DAY (BETA BLOCKER)   omega-3 acid ethyl esters (LOVAZA)  1 g capsule Take 1 g by mouth daily.   ONETOUCH ULTRA test strip    warfarin (COUMADIN) 5 MG tablet TAKE AS DIRECTED BY THE COUMADIN CLINIC    Allergies:   Atorvastatin, Codeine, Metformin and related, Morphine and related, Niacin, and Percocet [oxycodone-acetaminophen]   Social History   Tobacco Use   Smoking status: Former    Packs/day: 1.00    Years: 15.00    Total pack years: 15.00    Types: Cigarettes    Quit date: 08/12/1994    Years since quitting: 27.8   Smokeless tobacco: Never  Substance Use Topics   Alcohol use: No   Drug use: No    Family Hx: The patient's family history includes Breast cancer in her paternal aunt; COPD in her father; Cancer (age of onset: 15) in her  mother; Coronary artery disease in her father; Diabetes in her mother; Hypertension in an other family member.  Review of Systems  Constitutional: Negative for fever.  Respiratory:  Negative for cough.   Gastrointestinal:  Negative for diarrhea, hematochezia and vomiting.  Genitourinary:  Negative for hematuria.     EKGs/Labs/Other Test Reviewed:    EKG:  EKG is   ordered today.  The ekg ordered today demonstrates NSR, HR 70, normal axis, inferior Q waves, T wave inversions in 1 aVL, QTc 432, no change from prior tracing  Recent Labs: No results found for requested labs within last 365 days.   Recent Lipid Panel No results for input(s): "CHOL", "TRIG", "HDL", "VLDL", "LDLCALC", "LDLDIRECT" in the last 8760 hours.   Risk Assessment/Calculations/Metrics:              Physical Exam:    VS:  BP 108/62   Pulse 70   Ht '5\' 2"'  (1.575 m)   Wt 183 lb 3.2 oz (83.1 kg)   SpO2 95%   BMI 33.51 kg/m     Wt Readings from Last 3 Encounters:  06/04/22 183 lb 3.2 oz (83.1 kg)  08/15/21 184 lb (83.5 kg)  08/18/20 181 lb 6.4 oz (82.3 kg)    Constitutional:      Appearance: Healthy appearance. Not in distress.  Neck:     Vascular: JVD normal.  Pulmonary:     Effort: Pulmonary effort is normal.     Breath sounds: No wheezing. No rales.  Cardiovascular:     Normal rate. Regular rhythm. Normal S1. S2. Mechanical S2      Murmurs: There is a grade 3/6 systolic murmur at the URSB.  Edema:    Peripheral edema absent.  Abdominal:     Palpations: Abdomen is soft.  Skin:    General: Skin is warm and dry.  Neurological:     Mental Status: Alert and oriented to person, place and time.         ASSESSMENT & PLAN:   PVC's (premature ventricular contractions) She is experiencing symptomatic PVCs.  This was documented recently on her Apple Watch.  She has had these from time to time.  She does not drink any caffeine.  TSH in September was normal.  I have reassured her that these are benign.  I  will obtain a Zio patch monitor x3 days to assess PVC burden.  If she has high burden, she will need a follow-up echocardiogram sooner than planned to assess LV function.  I advised her that she can take an extra half to a whole metoprolol tartrate if she is having frequent palpitations.  Obtain BMET, magnesium today.  Follow-up in January Dr. Burt Knack as planned or sooner if symptoms should worsen.  Aortic valve disorder Bicuspid aortic valve stenosis status post mechanical AVR in 2012.  She has a patient prosthesis mismatch and has been extensively evaluated.  Her most recent echocardiogram in January 2023 demonstrated a mean gradient of 39 mmHg.  She has follow-up planned with Dr. Burt Knack in January with a follow-up echocardiogram.  Continue SBE prophylaxis.  Continue follow-up with Coumadin clinic.           Dispo:  Return in about 3 months (around 09/04/2022) for Planned Follow Up, w/ Dr. Burt Knack.   Medication Adjustments/Labs and Tests Ordered: Current medicines are reviewed at length with the patient today.  Concerns regarding medicines are outlined above.  Tests Ordered: Orders Placed This Encounter  Procedures   Basic metabolic panel   Magnesium   LONG TERM MONITOR (3-14 DAYS)   EKG 12-Lead   Medication Changes: No orders of the defined types were placed in this encounter.  Signed, Richardson Dopp, PA-C  06/04/2022 9:36 AM    [address], [place], [place]  [address] Phone: [phone]; Fax: [phone]

## 2022-06-04 ENCOUNTER — Ambulatory Visit: Payer: BC Managed Care – PPO | Attending: Physician Assistant | Admitting: Physician Assistant

## 2022-06-04 ENCOUNTER — Ambulatory Visit: Payer: BC Managed Care – PPO | Attending: Physician Assistant

## 2022-06-04 ENCOUNTER — Encounter: Payer: Self-pay | Admitting: Physician Assistant

## 2022-06-04 VITALS — BP 108/62 | HR 70 | Ht 62.0 in | Wt 183.2 lb

## 2022-06-04 DIAGNOSIS — I359 Nonrheumatic aortic valve disorder, unspecified: Secondary | ICD-10-CM | POA: Diagnosis not present

## 2022-06-04 DIAGNOSIS — R002 Palpitations: Secondary | ICD-10-CM

## 2022-06-04 DIAGNOSIS — I493 Ventricular premature depolarization: Secondary | ICD-10-CM

## 2022-06-04 HISTORY — DX: Ventricular premature depolarization: I49.3

## 2022-06-04 NOTE — Assessment & Plan Note (Signed)
Bicuspid aortic valve stenosis status post mechanical AVR in 2012.  She has a patient prosthesis mismatch and has been extensively evaluated.  Her most recent echocardiogram in January 2023 demonstrated a mean gradient of 39 mmHg.  She has follow-up planned with Dr. Burt Knack in January with a follow-up echocardiogram.  Continue SBE prophylaxis.  Continue follow-up with Coumadin clinic.

## 2022-06-04 NOTE — Patient Instructions (Signed)
Medication Instructions:  Your physician recommends that you continue on your current medications as directed. Please refer to the Current Medication list given to you today.  *If you need a refill on your cardiac medications before your next appointment, please call your pharmacy*   Lab Work: TODAY:  BMET & MAG  If you have labs (blood work) drawn today and your tests are completely normal, you will receive your results only by: [place] (if you have MyChart) OR A paper copy in the mail If you have any lab test that is abnormal or we need to change your treatment, we will call you to review the results.   Testing/Procedures: [place]- Long Term Monitor Instructions  Your physician has requested you wear a ZIO patch monitor for 14 days.  This is a single patch monitor. [employer] supplies one patch monitor per enrollment. Additional stickers are not available. Please do not apply patch if you will be having a Nuclear Stress Test,  Echocardiogram, Cardiac CT, MRI, or Chest Xray during the period you would be wearing the  monitor. The patch cannot be worn during these tests. You cannot remove and re-apply the  ZIO XT patch monitor.  Your ZIO patch monitor will be mailed 3 day USPS to your address on file. It may take 3-5 days  to receive your monitor after you have been enrolled.  Once you have received your monitor, please review the enclosed instructions. Your monitor  has already been registered assigning a specific monitor serial # to you.  Billing and Patient Assistance Program Information  We have supplied [employer] with any of your insurance information on file for billing purposes. [employer] offers a sliding scale Patient Assistance Program for patients that do not have  insurance, or whose insurance does not completely cover the cost of the ZIO monitor.  You must apply for the Patient Assistance Program to qualify for this discounted rate.  To apply, please call [employer] at  [phone], select option 4, select option 2, ask to apply for  Patient Assistance Program. [name] will ask your household income, and how many people  are in your household. They will quote your out-of-pocket cost based on that information.  [employer] will also be able to set up a 28-month interest-free payment plan if needed.  Applying the monitor   Shave hair from upper left chest.  Hold abrader disc by orange tab. Rub abrader in 40 strokes over the upper left chest as  indicated in your monitor instructions.  Clean area with 4 enclosed alcohol pads. Let dry.  Apply patch as indicated in monitor instructions. Patch will be placed under collarbone on left  side of chest with arrow pointing upward.  Rub patch adhesive wings for 2 minutes. Remove white label marked "1". Remove the white  label marked "2". Rub patch adhesive wings for 2 additional minutes.  While looking in a mirror, press and release button in center of patch. A small green light will  flash 3-4 times. This will be your only indicator that the monitor has been turned on.  Do not shower for the first 24 hours. You may shower after the first 24 hours.  Press the button if you feel a symptom. You will hear a small click. Record Date, Time and  Symptom in the Patient Logbook.  When you are ready to remove the patch, follow instructions on the last 2 pages of Patient  Logbook. Stick patch monitor onto the last page of Patient Logbook.  Place Patient Logbook in the blue and white box. Use locking tab on box and tape box closed  securely. The blue and white box has prepaid postage on it. Please place it in the mailbox as  soon as possible. Your physician should have your test results approximately 7 days after the  monitor has been mailed back to [facility].  Call [place] at [phone] if you have questions regarding  your ZIO XT patch monitor. Call them immediately if you see an orange light  blinking on your  monitor.  If your monitor falls off in less than 4 days, contact our Monitor department at [phone].  If your monitor becomes loose or falls off after 4 days call [employer] at [phone] for  suggestions on securing your monitor    Follow-Up: At [facility], you and your health needs are our priority.  As part of our continuing mission to provide you with exceptional heart care, we have created designated Provider Care Teams.  These Care Teams include your primary Cardiologist (physician) and Advanced Practice Providers (APPs -  Physician Assistants and Nurse Practitioners) who all work together to provide you with the care you need, when you need it.  We recommend signing up for the patient portal called "MyChart".  Sign up information is provided on this After Visit Summary.  MyChart is used to connect with patients for Virtual Visits (Telemedicine).  Patients are able to view lab/test results, encounter notes, upcoming appointments, etc.  Non-urgent messages can be sent to your provider as well.   To learn more about what you can do with MyChart, go to [email or web address].    Your next appointment:   In January 2024 (pt needs Echo before appt as well, recall in)  The format for your next appointment:   In Person  Provider:   Sherren Mocha, MD     Other Instructions   Important Information About Sugar

## 2022-06-04 NOTE — Progress Notes (Unsigned)
Enrolled for [employer] to mail a ZIO XT long term holter monitor to the patients address on file.   Dr. Cooper to read. 

## 2022-06-04 NOTE — Assessment & Plan Note (Signed)
She is experiencing symptomatic PVCs.  This was documented recently on her Apple Watch.  She has had these from time to time.  She does not drink any caffeine.  TSH in September was normal.  I have reassured her that these are benign.  I will obtain a Zio patch monitor x3 days to assess PVC burden.  If she has high burden, she will need a follow-up echocardiogram sooner than planned to assess LV function.  I advised her that she can take an extra half to a whole metoprolol tartrate if she is having frequent palpitations.  Obtain BMET, magnesium today.  Follow-up in January Dr. Burt Knack as planned or sooner if symptoms should worsen.

## 2022-06-05 ENCOUNTER — Ambulatory Visit: Payer: BC Managed Care – PPO | Attending: Cardiovascular Disease | Admitting: *Deleted

## 2022-06-05 DIAGNOSIS — I359 Nonrheumatic aortic valve disorder, unspecified: Secondary | ICD-10-CM

## 2022-06-05 DIAGNOSIS — Z952 Presence of prosthetic heart valve: Secondary | ICD-10-CM | POA: Diagnosis not present

## 2022-06-05 DIAGNOSIS — Z5181 Encounter for therapeutic drug level monitoring: Secondary | ICD-10-CM

## 2022-06-05 LAB — BASIC METABOLIC PANEL
BUN/Creatinine Ratio: 20 (ref 12–28)
BUN: 17 mg/dL (ref 8–27)
CO2: 23 mmol/L (ref 20–29)
Calcium: 9 mg/dL (ref 8.7–10.3)
Chloride: 104 mmol/L (ref 96–106)
Creatinine, Ser: 0.83 mg/dL (ref 0.57–1.00)
Glucose: 249 mg/dL — ABNORMAL HIGH (ref 70–99)
Potassium: 4.1 mmol/L (ref 3.5–5.2)
Sodium: 143 mmol/L (ref 134–144)
eGFR: 79 mL/min/{1.73_m2} (ref >59)

## 2022-06-05 LAB — POCT INR: INR: 3.4 — AB (ref 2.0–3.0)

## 2022-06-05 LAB — MAGNESIUM: Magnesium: 2.3 mg/dL (ref 1.6–2.3)

## 2022-06-05 NOTE — Patient Instructions (Signed)
Description   Hold today's dose and then start taking Warfarin 1 tablet daily except 1/2 tablet on Sundays and Thursdays. Recheck in 4 weeks (normally 6 weeks). [facility]in Clinic [phone] or [phone] Main Fax [phone]

## 2022-06-07 DIAGNOSIS — R002 Palpitations: Secondary | ICD-10-CM

## 2022-06-10 ENCOUNTER — Ambulatory Visit
Admission: RE | Admit: 2022-06-10 | Discharge: 2022-06-10 | Disposition: A | Discharge status: Home / Self Care | Payer: BC Managed Care – PPO | Source: Ambulatory Visit | Attending: Internal Medicine | Admitting: Internal Medicine

## 2022-06-10 DIAGNOSIS — Z1231 Encounter for screening mammogram for malignant neoplasm of breast: Secondary | ICD-10-CM

## 2022-06-12 DIAGNOSIS — R002 Palpitations: Secondary | ICD-10-CM | POA: Diagnosis not present

## 2022-06-14 ENCOUNTER — Encounter: Payer: Self-pay | Admitting: Physician Assistant

## 2022-07-01 ENCOUNTER — Ambulatory Visit: Payer: BC Managed Care – PPO | Attending: Cardiology

## 2022-07-01 DIAGNOSIS — Z952 Presence of prosthetic heart valve: Secondary | ICD-10-CM | POA: Diagnosis not present

## 2022-07-01 DIAGNOSIS — I359 Nonrheumatic aortic valve disorder, unspecified: Secondary | ICD-10-CM | POA: Diagnosis not present

## 2022-07-01 DIAGNOSIS — Z5181 Encounter for therapeutic drug level monitoring: Secondary | ICD-10-CM

## 2022-07-01 LAB — POCT INR: INR: 2.7 (ref 2.0–3.0)

## 2022-07-01 NOTE — Patient Instructions (Signed)
Continue taking Warfarin 1 tablet daily except 1/2 tablet on Thursdays. Recheck in 6 weeks (normally 6 weeks). [facility]madin Clinic [phone] or [phone] Main Fax [phone]

## 2022-08-04 DIAGNOSIS — J0100 Acute maxillary sinusitis, unspecified: Secondary | ICD-10-CM | POA: Diagnosis not present

## 2022-08-04 DIAGNOSIS — B349 Viral infection, unspecified: Secondary | ICD-10-CM | POA: Diagnosis not present

## 2022-08-13 ENCOUNTER — Ambulatory Visit: Payer: BC Managed Care – PPO | Attending: Internal Medicine

## 2022-08-13 DIAGNOSIS — Z952 Presence of prosthetic heart valve: Secondary | ICD-10-CM

## 2022-08-13 DIAGNOSIS — Z5181 Encounter for therapeutic drug level monitoring: Secondary | ICD-10-CM | POA: Diagnosis not present

## 2022-08-13 DIAGNOSIS — I359 Nonrheumatic aortic valve disorder, unspecified: Secondary | ICD-10-CM | POA: Diagnosis not present

## 2022-08-13 LAB — POCT INR: INR: 3.5 — AB (ref 2.0–3.0)

## 2022-08-13 NOTE — Patient Instructions (Signed)
HOLD TONIGHT ONLY THEN Continue taking Warfarin 1 tablet daily except 1/2 tablet on Thursdays. Recheck in 4 weeks (normally 6 weeks). Coumadin Clinic [phone] or [phone] Main Fax [phone]

## 2022-09-10 ENCOUNTER — Ambulatory Visit: Payer: BC Managed Care – PPO | Attending: Cardiology | Admitting: *Deleted

## 2022-09-10 DIAGNOSIS — Z952 Presence of prosthetic heart valve: Secondary | ICD-10-CM

## 2022-09-10 DIAGNOSIS — Z5181 Encounter for therapeutic drug level monitoring: Secondary | ICD-10-CM | POA: Diagnosis not present

## 2022-09-10 DIAGNOSIS — I359 Nonrheumatic aortic valve disorder, unspecified: Secondary | ICD-10-CM

## 2022-09-10 LAB — POCT INR: INR: 2.7 (ref 2.0–3.0)

## 2022-09-10 NOTE — Patient Instructions (Signed)
Description   Continue taking Warfarin 1 tablet daily except 1/2 tablet on Thursdays. Recheck in  6 weeks. Coumadin Clinic [phone] or [phone] Main Fax [phone]

## 2022-09-30 ENCOUNTER — Ambulatory Visit: Payer: BC Managed Care – PPO | Attending: Internal Medicine

## 2022-09-30 ENCOUNTER — Encounter: Payer: Self-pay | Admitting: Cardiovascular Disease

## 2022-09-30 ENCOUNTER — Ambulatory Visit (INDEPENDENT_AMBULATORY_CARE_PROVIDER_SITE_OTHER): Payer: BC Managed Care – PPO | Admitting: Cardiovascular Disease

## 2022-09-30 VITALS — BP 126/80 | HR 71 | Ht 62.0 in | Wt 185.4 lb

## 2022-09-30 DIAGNOSIS — I359 Nonrheumatic aortic valve disorder, unspecified: Secondary | ICD-10-CM | POA: Insufficient documentation

## 2022-09-30 DIAGNOSIS — E782 Mixed hyperlipidemia: Secondary | ICD-10-CM | POA: Insufficient documentation

## 2022-09-30 DIAGNOSIS — R002 Palpitations: Secondary | ICD-10-CM

## 2022-09-30 LAB — ECHOCARDIOGRAM COMPLETE
AR max vel: 0.88 cm2
AV Area VTI: 1.07 cm2
AV Area mean vel: 0.91 cm2
AV Mean grad: 34 mmHg
AV Peak grad: 64.3 mmHg
Ao pk vel: 4.01 m/s
Area-P 1/2: 2.8 cm2
S' Lateral: 2.4 cm

## 2022-09-30 NOTE — Patient Instructions (Signed)
Medication Instructions:  Your physician recommends that you continue on your current medications as directed. Please refer to the Current Medication list given to you today.  *If you need a refill on your cardiac medications before your next appointment, please call your pharmacy*   Lab Work: NONE If you have labs (blood work) drawn today and your tests are completely normal, you will receive your results only by: [place] (if you have MyChart) OR A paper copy in the mail If you have any lab test that is abnormal or we need to change your treatment, we will call you to review the results.   Testing/Procedures: ECHO (same day as next appt in 1 year) Your physician has requested that you have an echocardiogram. Echocardiography is a painless test that uses sound waves to create images of your heart. It provides your doctor with information about the size and shape of your heart and how well your heart's chambers and valves are working. This procedure takes approximately one hour. There are no restrictions for this procedure. Please do NOT wear cologne, perfume, aftershave, or lotions (deodorant is allowed). Please arrive 15 minutes prior to your appointment time.  Follow-Up: At [facility], you and your health needs are our priority.  As part of our continuing mission to provide you with exceptional heart care, we have created designated Provider Care Teams.  These Care Teams include your primary Cardiologist (physician) and Advanced Practice Providers (APPs -  Physician Assistants and Nurse Practitioners) who all work together to provide you with the care you need, when you need it.  Your next appointment:   1 year(s)  Provider:   Sherren Mocha, MD

## 2022-09-30 NOTE — Progress Notes (Unsigned)
Cardiology Office Note:    Date:  09/30/2022   ID:  Tichelle Simino, DOB 1956/06/21, MRN [id number]  PCP:  Wenda Low, [place] [facility] Cardiologist:  Sherren Mocha, MD     Referring MD: Wenda Low, MD   No chief complaint on file.   History of Present Illness:    [name] is a 67 y.o. female with a hx of: Bicuspid Aortic stenosis S/p Mechanical AVR in 2012 Elevated transvalvular gradient since surgery Patient - Prosthesis Mismatch Maintained on ASA + Coumadin b/c of valve mismatch and ? gradient Diabetes mellitus  GERD  Hyperlipidemia   The patient is here alone today.  She has been doing fine.  She watches over her 71 young grandchildren on a regular basis.  She has not engaged in formal exercise, but remains pretty active.  She denies any symptoms of chest pain, chest pressure, shortness of breath, or lightheadedness.  She does have occasional heart palpitations.  No other complaints today.  Denies bleeding problems on a combination of aspirin and warfarin.  Past Medical History:  Diagnosis Date   Angina    chest tightness and pressure due to aortic stenosis   Aortic stenosis    Diabetes mellitus    newly diagnosed in july, 2012   GERD (gastroesophageal reflux disease)    Headache(784.0)    migraines when younger   Heart murmur    aortic stenosis - dr. Burt Knack is her cardiologist   Hx of hysterectomy    Hyperlipidemia    Multinodular thyroid    Palpitation    PONV (postoperative nausea and vomiting)    PVC's (premature ventricular contractions) 06/04/2022   Monitor 06/2022: The basic rhythm is normal sinus with an average HR of 73 bpm. There is a single nonsustained supraventricular run of 8 beats. No sustained arrhythmia. No afib or flutter. PVCs < 1%.   Shortness of breath    associated with as    Past Surgical History:  Procedure Laterality Date   ABDOMINAL HYSTERECTOMY     AORTIC VALVE REPLACEMENT  06/25/2011    Procedure: AORTIC VALVE REPLACEMENT (AVR);  Surgeon: Tharon Aquas Adelene Idler, MD;  Location: [facility];  Service: Open Heart Surgery;  Laterality: N/A;   BREAST CYST ASPIRATION Left 2007   BUNIONECTOMY     CARDIAC VALVE REPLACEMENT     Aortic Valve   06/25/2011   CESAREAN SECTION     CYSTOSCOPY WITH RETROGRADE PYELOGRAM, URETEROSCOPY AND STENT PLACEMENT Left 10/21/2014   Procedure: CYSTOSCOPY WITH LEFT RETROGRADE PYELOGRAM, URETEROSCOPY BIOPSY AND STENT PLACEMENT;  Surgeon: Alexis Frock, MD;  Location: [facility];  Service: Urology;  Laterality: Left;   PARTIAL HYSTERECTOMY     TEE WITHOUT CARDIOVERSION N/A 09/18/2012   Procedure: TRANSESOPHAGEAL ECHOCARDIOGRAM (TEE);  Surgeon: Fay Records, MD;  Location: [facility];  Service: Cardiovascular;  Laterality: N/A;    Current Medications: Current Meds  Medication Sig   amoxicillin (AMOXIL) 500 MG capsule TAKE 4 CAPSULES BY MOUTH 1 HOUR PRIOR TO ALL DENTAL VISITS   aspirin 81 MG tablet Take 81 mg by mouth every morning.    Blood Glucose Monitoring Suppl (ONE TOUCH ULTRA 2) w/Device KIT    CRESTOR 20 MG tablet TAKE 1 TABLET BY MOUTH ONCE A DAY   dapagliflozin propanediol (FARXIGA) 10 MG TABS tablet Take 10 mg by mouth daily.   Dulaglutide (TRULICITY) A999333 0000000 SOPN Inject 0.75 mg into the skin.   glimepiride (AMARYL) 1 MG tablet Take 1  mg by mouth 2 (two) times daily.   metoprolol tartrate (LOPRESSOR) 25 MG tablet TAKE 1 TABLET TWICE A DAY (BETA BLOCKER)   omega-3 acid ethyl esters (LOVAZA) 1 g capsule Take 1 g by mouth daily.   ONETOUCH ULTRA test strip    warfarin (COUMADIN) 5 MG tablet TAKE AS DIRECTED BY THE COUMADIN CLINIC     Allergies:   Atorvastatin, Codeine, Metformin and related, Morphine and related, Niacin, and Percocet [oxycodone-acetaminophen]   Social History   Socioeconomic History   Marital status: Married    Spouse name: Not on file   Number of children: Not on file   Years of education: Not on file   Highest education  level: Not on file  Occupational History   Not on file  Tobacco Use   Smoking status: Former    Packs/day: 1.00    Years: 15.00    Total pack years: 15.00    Types: Cigarettes    Quit date: 08/12/1994    Years since quitting: 28.1   Smokeless tobacco: Never  Substance and Sexual Activity   Alcohol use: No   Drug use: No   Sexual activity: Not on file  Other Topics Concern   Not on file  Social History Narrative   Married - one daughter 62 y/o   Occupation: Employed as Banker for [employer] Determinants of Radio broadcast assistant Strain: Not on file  Food Insecurity: Not on file  Transportation Needs: Not on file  Physical Activity: Not on file  Stress: Not on file  Social Connections: Not on file     Family History: The patient's family history includes Breast cancer in her paternal aunt; COPD in her father; Cancer (age of onset: 10) in her mother; Coronary artery disease in her father; Diabetes in her mother; Hypertension in an other family member.  ROS:   Please see the history of present illness.    All other systems reviewed and are negative.  EKGs/Labs/Other Studies Reviewed:    The following studies were reviewed today: Event Monitor 06/12/2022: Patient had a min HR of 46 bpm, max HR of 162 bpm, and avg HR of 73 bpm. Predominant underlying rhythm was Sinus Rhythm. 1 run of Supraventricular Tachycardia occurred lasting 8 beats with a max rate of 162 bpm (avg 139 bpm). Isolated SVEs were rare  (<1.0%), SVE Couplets were rare (<1.0%), and no SVE Triplets were present. Isolated VEs were rare (<1.0%, 305), VE Triplets were rare (<1.0%, 1), and no VE Couplets were present. Ventricular Bigeminy and Trigeminy were present.    SUMMARY: The basic rhythm is normal sinus with an average HR of 73 bpm. There is a single nonsustained supraventricular run of 8 beats. No sustained arrhythmia. No afib or flutter.   EKG:  EKG is not ordered today.    Recent  Labs: 06/04/2022: BUN 17; Creatinine, Ser 0.83; Magnesium 2.3; Potassium 4.1; Sodium 143  Recent Lipid Panel    Component Value Date/Time   CHOL 163 03/03/2013 0732   TRIG 238.0 (H) 03/03/2013 0732   HDL 46.20 03/03/2013 0732   CHOLHDL 4 03/03/2013 0732   VLDL 47.6 (H) 03/03/2013 0732   LDLCALC 91 03/18/2011 0743   LDLDIRECT 93.3 03/03/2013 0732     Risk Assessment/Calculations:                Physical Exam:    VS:  BP 126/80   Pulse 71   Ht 5' 2"$  (1.575 m)  Wt 185 lb 6.4 oz (84.1 kg)   SpO2 95%   BMI 33.91 kg/m     Wt Readings from Last 3 Encounters:  09/30/22 185 lb 6.4 oz (84.1 kg)  06/04/22 183 lb 3.2 oz (83.1 kg)  08/15/21 184 lb (83.5 kg)     GEN:  Well nourished, well developed in no acute distress HEENT: Normal NECK: No JVD; No carotid bruits LYMPHATICS: No lymphadenopathy CARDIAC: RRR, 3/6 systolic murmur at the RUSB, no diastolic murmur RESPIRATORY:  Clear to auscultation without rales, wheezing or rhonchi  ABDOMEN: Soft, non-tender, non-distended MUSCULOSKELETAL:  No edema; No deformity  SKIN: Warm and dry NEUROLOGIC:  Alert and oriented x 3 PSYCHIATRIC:  Normal affect   ASSESSMENT:    1. Aortic valve disorder   2. Palpitations   3. Mixed hyperlipidemia    PLAN:    In order of problems listed above:  Appears clinically stable.  No symptoms at present.  The patient is interested in starting an exercise program with a trainer.  We discussed exercise restrictions today.  I would like her to establish a regular program with mild to moderate level physical exercise.  We tried to set some rough parameters such as keeping her maximum heart rate less than 120 bpm.  She also will keep a close eye on symptoms with exertion.  In the past, she had issues with exertional lightheadedness.  She understands that if she develops exertional dyspnea, chest discomfort, or lightheadedness, that she needs to slow down or stop her activity.  I personally reviewed her  echo images today with the formal interpretation pending.  Her echo demonstrates a peak systolic velocity of 4 m/s, dimensionless index 0.31, peak and mean transaortic gradients of 64 and 34 mm, respectively, and trivial aortic insufficiency.  LV function remains vigorous with LVEF greater than 65%.  Findings are compared to last year's echo with no significant change.  In fact, her gradients are lower than last year.  I will reach out to the patient if there are any other pertinent findings on the formal interpretation.  She continues on low-dose aspirin and warfarin without bleeding complications. Mild, monitor reviewed as outlined above.  We discussed the findings today.  She will continue on metoprolol.  Will notify me if any changes arise. Treated with rosuvastatin 20 mg daily.  We also discussed lifestyle modification at length.     Medication Adjustments/Labs and Tests Ordered: Current medicines are reviewed at length with the patient today.  Concerns regarding medicines are outlined above.  No orders of the defined types were placed in this encounter.  No orders of the defined types were placed in this encounter.   There are no Patient Instructions on file for this visit.   Signed, Sherren Mocha, MD  09/30/2022 10:05 AM    [place]

## 2022-10-22 ENCOUNTER — Ambulatory Visit: Payer: BC Managed Care – PPO | Attending: Cardiovascular Disease

## 2022-10-22 DIAGNOSIS — I359 Nonrheumatic aortic valve disorder, unspecified: Secondary | ICD-10-CM | POA: Diagnosis not present

## 2022-10-22 DIAGNOSIS — Z5181 Encounter for therapeutic drug level monitoring: Secondary | ICD-10-CM

## 2022-10-22 DIAGNOSIS — Z952 Presence of prosthetic heart valve: Secondary | ICD-10-CM | POA: Diagnosis not present

## 2022-10-22 LAB — POCT INR: INR: 2.9 (ref 2.0–3.0)

## 2022-10-22 NOTE — Patient Instructions (Signed)
Description   ?Continue taking Warfarin 1 tablet daily except 1/2 tablet on Thursdays. Recheck in 6 weeks.  ?[facility] #[phone] or [phone] Main Fax #[phone] ?  ?  ? ?

## 2022-11-28 ENCOUNTER — Other Ambulatory Visit (HOSPITAL_COMMUNITY): Payer: Self-pay | Admitting: Internal Medicine

## 2022-11-28 ENCOUNTER — Ambulatory Visit (HOSPITAL_COMMUNITY)
Admission: RE | Admit: 2022-11-28 | Discharge: 2022-11-28 | Disposition: A | Discharge status: Home / Self Care | Payer: BC Managed Care – PPO | Source: Ambulatory Visit | Attending: Internal Medicine | Admitting: Internal Medicine

## 2022-11-28 DIAGNOSIS — M79604 Pain in right leg: Secondary | ICD-10-CM | POA: Diagnosis not present

## 2022-11-28 DIAGNOSIS — M7989 Other specified soft tissue disorders: Secondary | ICD-10-CM | POA: Insufficient documentation

## 2022-11-28 DIAGNOSIS — Z5181 Encounter for therapeutic drug level monitoring: Secondary | ICD-10-CM | POA: Diagnosis not present

## 2022-11-28 DIAGNOSIS — M79669 Pain in unspecified lower leg: Secondary | ICD-10-CM | POA: Diagnosis not present

## 2022-11-28 DIAGNOSIS — Z79899 Other long term (current) drug therapy: Secondary | ICD-10-CM | POA: Diagnosis not present

## 2022-12-04 ENCOUNTER — Ambulatory Visit: Payer: BC Managed Care – PPO | Attending: Internal Medicine | Admitting: *Deleted

## 2022-12-04 DIAGNOSIS — Z5181 Encounter for therapeutic drug level monitoring: Secondary | ICD-10-CM | POA: Diagnosis not present

## 2022-12-04 DIAGNOSIS — Z952 Presence of prosthetic heart valve: Secondary | ICD-10-CM

## 2022-12-04 DIAGNOSIS — I359 Nonrheumatic aortic valve disorder, unspecified: Secondary | ICD-10-CM | POA: Diagnosis not present

## 2022-12-04 LAB — POCT INR: INR: 3.7 — AB (ref 2.0–3.0)

## 2022-12-04 NOTE — Patient Instructions (Signed)
Description   Do not take any warfarin today then continue taking Warfarin 1 tablet daily except 1/2 tablet on Thursdays. Recheck in 5 weeks. [facility] [phone] or [phone] Main Fax #[phone]

## 2023-01-01 ENCOUNTER — Ambulatory Visit: Payer: BC Managed Care – PPO

## 2023-01-08 ENCOUNTER — Other Ambulatory Visit: Payer: Self-pay | Admitting: Cardiovascular Disease

## 2023-01-09 ENCOUNTER — Ambulatory Visit: Payer: BC Managed Care – PPO | Attending: Cardiology

## 2023-01-09 DIAGNOSIS — Z952 Presence of prosthetic heart valve: Secondary | ICD-10-CM | POA: Diagnosis not present

## 2023-01-09 DIAGNOSIS — I359 Nonrheumatic aortic valve disorder, unspecified: Secondary | ICD-10-CM | POA: Diagnosis not present

## 2023-01-09 DIAGNOSIS — Z5181 Encounter for therapeutic drug level monitoring: Secondary | ICD-10-CM

## 2023-01-09 LAB — POCT INR: INR: 3.2 — AB (ref 2.0–3.0)

## 2023-01-09 NOTE — Patient Instructions (Signed)
Description   Eat greens today and START taking Warfarin 1 tablet daily except 1/2 tablet on Tuesdays and Thursdays.  Recheck in 2 weeks.  [facility] [phone] or [phone] Main Fax #[phone]

## 2023-01-24 ENCOUNTER — Ambulatory Visit: Payer: BC Managed Care – PPO | Attending: Cardiovascular Disease

## 2023-01-24 DIAGNOSIS — Z5181 Encounter for therapeutic drug level monitoring: Secondary | ICD-10-CM

## 2023-01-24 DIAGNOSIS — I359 Nonrheumatic aortic valve disorder, unspecified: Secondary | ICD-10-CM

## 2023-01-24 DIAGNOSIS — Z952 Presence of prosthetic heart valve: Secondary | ICD-10-CM

## 2023-01-24 LAB — POCT INR: INR: 2 (ref 2.0–3.0)

## 2023-01-24 NOTE — Patient Instructions (Signed)
Description   Take 1.5 tablets today and then continue taking Warfarin 1 tablet daily except 1/2 tablet on Tuesdays and Thursdays.  Recheck in 3 weeks.  [facility] [phone] or [phone] Main Fax #[phone]

## 2023-02-03 DIAGNOSIS — I700 Atherosclerosis of aorta: Secondary | ICD-10-CM | POA: Diagnosis not present

## 2023-02-03 DIAGNOSIS — Z952 Presence of prosthetic heart valve: Secondary | ICD-10-CM | POA: Diagnosis not present

## 2023-02-03 DIAGNOSIS — J439 Emphysema, unspecified: Secondary | ICD-10-CM | POA: Diagnosis not present

## 2023-02-03 DIAGNOSIS — E1169 Type 2 diabetes mellitus with other specified complication: Secondary | ICD-10-CM | POA: Diagnosis not present

## 2023-02-16 ENCOUNTER — Other Ambulatory Visit: Payer: Self-pay | Admitting: Cardiovascular Disease

## 2023-02-16 DIAGNOSIS — I493 Ventricular premature depolarization: Secondary | ICD-10-CM

## 2023-02-18 ENCOUNTER — Ambulatory Visit: Payer: BC Managed Care – PPO | Attending: Cardiology

## 2023-02-18 DIAGNOSIS — Z5181 Encounter for therapeutic drug level monitoring: Secondary | ICD-10-CM | POA: Diagnosis not present

## 2023-02-18 DIAGNOSIS — I359 Nonrheumatic aortic valve disorder, unspecified: Secondary | ICD-10-CM | POA: Diagnosis not present

## 2023-02-18 DIAGNOSIS — Z952 Presence of prosthetic heart valve: Secondary | ICD-10-CM | POA: Diagnosis not present

## 2023-02-18 LAB — POCT INR: INR: 2.7 (ref 2.0–3.0)

## 2023-02-18 NOTE — Patient Instructions (Signed)
Description   Continue on same dosage of Warfarin you have been taking 1 tablet daily except 1/2 tablet on Thursdays.  Recheck in 4 weeks.  [facility] [phone] or [phone] Main Fax #[phone]

## 2023-02-25 ENCOUNTER — Other Ambulatory Visit: Payer: Self-pay

## 2023-02-25 DIAGNOSIS — Z952 Presence of prosthetic heart valve: Secondary | ICD-10-CM

## 2023-02-25 MED ORDER — WARFARIN SODIUM 5 MG PO TABS
ORAL_TABLET | ORAL | 3 refills | Status: DC
Start: 2023-02-25 — End: 2023-12-01

## 2023-03-18 ENCOUNTER — Ambulatory Visit: Payer: BC Managed Care – PPO | Attending: Internal Medicine | Admitting: *Deleted

## 2023-03-18 DIAGNOSIS — Z952 Presence of prosthetic heart valve: Secondary | ICD-10-CM | POA: Diagnosis not present

## 2023-03-18 DIAGNOSIS — I359 Nonrheumatic aortic valve disorder, unspecified: Secondary | ICD-10-CM | POA: Diagnosis not present

## 2023-03-18 DIAGNOSIS — Z5181 Encounter for therapeutic drug level monitoring: Secondary | ICD-10-CM

## 2023-03-18 LAB — POCT INR: INR: 3.3 — AB (ref 2.0–3.0)

## 2023-03-18 NOTE — Patient Instructions (Signed)
Description   Do not take any warfarin today then continue taking Warfarin you have been taking 1 tablet daily except 1/2 tablet on Thursdays.  Recheck in 4 weeks.  [facility] [phone] or [phone] Main Fax #[phone]

## 2023-04-18 ENCOUNTER — Ambulatory Visit: Payer: BC Managed Care – PPO | Attending: Cardiology

## 2023-04-18 DIAGNOSIS — Z5181 Encounter for therapeutic drug level monitoring: Secondary | ICD-10-CM | POA: Diagnosis not present

## 2023-04-18 DIAGNOSIS — I359 Nonrheumatic aortic valve disorder, unspecified: Secondary | ICD-10-CM

## 2023-04-18 DIAGNOSIS — Z952 Presence of prosthetic heart valve: Secondary | ICD-10-CM | POA: Diagnosis not present

## 2023-04-18 LAB — POCT INR: INR: 3.2 — AB (ref 2.0–3.0)

## 2023-04-18 NOTE — Patient Instructions (Signed)
Description   Only take 1/2 tablet today and then continue taking Warfarin you have been taking 1 tablet daily except 1/2 tablet on Thursdays.  Stay consistent with greens each week (2 per week)  Recheck in 4 weeks.  [facility] [phone] or [phone] Main Fax #[phone]

## 2023-04-29 DIAGNOSIS — L578 Other skin changes due to chronic exposure to nonionizing radiation: Secondary | ICD-10-CM | POA: Diagnosis not present

## 2023-04-29 DIAGNOSIS — D225 Melanocytic nevi of trunk: Secondary | ICD-10-CM | POA: Diagnosis not present

## 2023-04-29 DIAGNOSIS — Z86018 Personal history of other benign neoplasm: Secondary | ICD-10-CM | POA: Diagnosis not present

## 2023-04-29 DIAGNOSIS — L814 Other melanin hyperpigmentation: Secondary | ICD-10-CM | POA: Diagnosis not present

## 2023-05-12 ENCOUNTER — Other Ambulatory Visit: Payer: Self-pay | Admitting: Internal Medicine

## 2023-05-12 DIAGNOSIS — Z1231 Encounter for screening mammogram for malignant neoplasm of breast: Secondary | ICD-10-CM

## 2023-05-16 ENCOUNTER — Ambulatory Visit: Payer: BC Managed Care – PPO

## 2023-05-27 ENCOUNTER — Ambulatory Visit: Payer: BC Managed Care – PPO | Attending: Cardiology

## 2023-05-27 DIAGNOSIS — Z952 Presence of prosthetic heart valve: Secondary | ICD-10-CM

## 2023-05-27 DIAGNOSIS — Z5181 Encounter for therapeutic drug level monitoring: Secondary | ICD-10-CM | POA: Diagnosis not present

## 2023-05-27 DIAGNOSIS — I359 Nonrheumatic aortic valve disorder, unspecified: Secondary | ICD-10-CM

## 2023-05-27 LAB — POCT INR: INR: 3.4 — AB (ref 2.0–3.0)

## 2023-05-27 NOTE — Patient Instructions (Signed)
DECREASE TO 1 tablet daily except 1/2 tablet on Tuesdays and Thursdays.  Stay consistent with greens each week (2 per week)  Recheck in 3 weeks.  [facility] [phone] or [phone] Main Fax #[phone]

## 2023-06-11 DIAGNOSIS — E7800 Pure hypercholesterolemia, unspecified: Secondary | ICD-10-CM | POA: Diagnosis not present

## 2023-06-11 DIAGNOSIS — Z23 Encounter for immunization: Secondary | ICD-10-CM | POA: Diagnosis not present

## 2023-06-11 DIAGNOSIS — E559 Vitamin D deficiency, unspecified: Secondary | ICD-10-CM | POA: Diagnosis not present

## 2023-06-11 DIAGNOSIS — Z0000 Encounter for general adult medical examination without abnormal findings: Secondary | ICD-10-CM | POA: Diagnosis not present

## 2023-06-11 DIAGNOSIS — I700 Atherosclerosis of aorta: Secondary | ICD-10-CM | POA: Diagnosis not present

## 2023-06-11 DIAGNOSIS — E1169 Type 2 diabetes mellitus with other specified complication: Secondary | ICD-10-CM | POA: Diagnosis not present

## 2023-06-13 ENCOUNTER — Ambulatory Visit
Admission: RE | Admit: 2023-06-13 | Discharge: 2023-06-13 | Disposition: A | Discharge status: Home / Self Care | Payer: BC Managed Care – PPO | Source: Ambulatory Visit | Attending: Internal Medicine | Admitting: Internal Medicine

## 2023-06-13 DIAGNOSIS — Z1231 Encounter for screening mammogram for malignant neoplasm of breast: Secondary | ICD-10-CM

## 2023-06-17 ENCOUNTER — Ambulatory Visit: Payer: BC Managed Care – PPO | Attending: Cardiovascular Disease | Admitting: *Deleted

## 2023-06-17 DIAGNOSIS — I359 Nonrheumatic aortic valve disorder, unspecified: Secondary | ICD-10-CM

## 2023-06-17 DIAGNOSIS — Z952 Presence of prosthetic heart valve: Secondary | ICD-10-CM

## 2023-06-17 DIAGNOSIS — Z5181 Encounter for therapeutic drug level monitoring: Secondary | ICD-10-CM

## 2023-06-17 LAB — POCT INR: INR: 1.9 — AB (ref 2.0–3.0)

## 2023-06-17 NOTE — Patient Instructions (Signed)
Description   Start taking warfarin 1 tablet daily except 1/2 tablet on Thursdays.  Stay consistent with greens each week (2 per week)  Recheck in 4 weeks.  [facility] [phone] or [phone] Main Fax #[phone]

## 2023-06-18 DIAGNOSIS — M25511 Pain in right shoulder: Secondary | ICD-10-CM | POA: Diagnosis not present

## 2023-07-17 ENCOUNTER — Ambulatory Visit: Payer: BC Managed Care – PPO | Attending: Cardiovascular Disease

## 2023-07-17 DIAGNOSIS — Z5181 Encounter for therapeutic drug level monitoring: Secondary | ICD-10-CM | POA: Diagnosis not present

## 2023-07-17 DIAGNOSIS — I359 Nonrheumatic aortic valve disorder, unspecified: Secondary | ICD-10-CM

## 2023-07-17 DIAGNOSIS — Z952 Presence of prosthetic heart valve: Secondary | ICD-10-CM

## 2023-07-17 LAB — POCT INR: INR: 1.5 — AB (ref 2.0–3.0)

## 2023-07-17 NOTE — Patient Instructions (Addendum)
    Description   Start taking warfarin 1 tablet daily. Stay consistent with greens each week (2 per week)  Recheck in 2 weeks.  [facility] [phone] or [phone] Main Fax #[phone]

## 2023-07-28 DIAGNOSIS — M7521 Bicipital tendinitis, right shoulder: Secondary | ICD-10-CM | POA: Diagnosis not present

## 2023-08-01 ENCOUNTER — Ambulatory Visit: Payer: BC Managed Care – PPO | Attending: Cardiovascular Disease

## 2023-08-01 DIAGNOSIS — Z952 Presence of prosthetic heart valve: Secondary | ICD-10-CM | POA: Diagnosis not present

## 2023-08-01 DIAGNOSIS — Z5181 Encounter for therapeutic drug level monitoring: Secondary | ICD-10-CM

## 2023-08-01 DIAGNOSIS — I359 Nonrheumatic aortic valve disorder, unspecified: Secondary | ICD-10-CM | POA: Diagnosis not present

## 2023-08-01 LAB — POCT INR: INR: 1.5 — AB (ref 2.0–3.0)

## 2023-08-01 NOTE — Patient Instructions (Signed)
TAKE 2 TABLETS TODAY ONLY THEN Start taking warfarin 1 tablet daily, EXCEPT 1.5 TABLETS ON WEDNESDAYS. Stay consistent with greens each week (2 per week)  Recheck in 3 weeks.  [facility] [phone] or [phone] Main Fax #[phone]

## 2023-08-22 ENCOUNTER — Ambulatory Visit: Payer: BC Managed Care – PPO

## 2023-08-26 ENCOUNTER — Ambulatory Visit: Payer: BC Managed Care – PPO | Attending: Cardiovascular Disease

## 2023-08-26 DIAGNOSIS — Z952 Presence of prosthetic heart valve: Secondary | ICD-10-CM | POA: Diagnosis not present

## 2023-08-26 DIAGNOSIS — I359 Nonrheumatic aortic valve disorder, unspecified: Secondary | ICD-10-CM | POA: Diagnosis not present

## 2023-08-26 DIAGNOSIS — Z5181 Encounter for therapeutic drug level monitoring: Secondary | ICD-10-CM | POA: Diagnosis not present

## 2023-08-26 LAB — POCT INR: INR: 2.3 (ref 2.0–3.0)

## 2023-08-26 NOTE — Patient Instructions (Signed)
 Continue taking warfarin 1 tablet daily, EXCEPT 1.5 TABLETS ON WEDNESDAYS. Stay consistent with greens each week (2 per week)  Recheck in 5 weeks.  [facility] [phone] or [phone] Main Fax #[phone]

## 2023-09-08 ENCOUNTER — Telehealth: Payer: Self-pay | Admitting: Cardiovascular Disease

## 2023-09-08 ENCOUNTER — Other Ambulatory Visit: Payer: Self-pay | Admitting: Cardiovascular Disease

## 2023-09-08 DIAGNOSIS — Z5181 Encounter for therapeutic drug level monitoring: Secondary | ICD-10-CM

## 2023-09-08 DIAGNOSIS — I493 Ventricular premature depolarization: Secondary | ICD-10-CM

## 2023-09-08 DIAGNOSIS — I359 Nonrheumatic aortic valve disorder, unspecified: Secondary | ICD-10-CM

## 2023-09-08 DIAGNOSIS — Z952 Presence of prosthetic heart valve: Secondary | ICD-10-CM

## 2023-09-08 NOTE — Telephone Encounter (Signed)
Patient is requesting echo scheduled for 02/03 be moved to that month of April for insurance reasons. Pt needs this echo order expiration moved to after April so she can schedule for that time frame.

## 2023-09-08 NOTE — Telephone Encounter (Signed)
New orders have been placed.

## 2023-09-11 DIAGNOSIS — E1165 Type 2 diabetes mellitus with hyperglycemia: Secondary | ICD-10-CM | POA: Diagnosis not present

## 2023-09-15 ENCOUNTER — Ambulatory Visit (HOSPITAL_COMMUNITY): Payer: BC Managed Care – PPO

## 2023-09-15 ENCOUNTER — Ambulatory Visit: Payer: BC Managed Care – PPO | Admitting: Cardiovascular Disease

## 2023-09-24 ENCOUNTER — Telehealth: Payer: Self-pay

## 2023-09-24 DIAGNOSIS — J0140 Acute pansinusitis, unspecified: Secondary | ICD-10-CM | POA: Diagnosis not present

## 2023-09-24 NOTE — Telephone Encounter (Signed)
Pt called and states she was prescribed Doxycycline 100mg  BID x 7 days. Scheduled coumadin clinic appt for Friday, 09/26/23 at 11:00am to check INR since this antibiotic can cause INR to be supra therapeutic at times. Pt verbalized understanding.

## 2023-09-26 ENCOUNTER — Ambulatory Visit: Payer: BC Managed Care – PPO | Attending: Cardiovascular Disease | Admitting: *Deleted

## 2023-09-26 DIAGNOSIS — I359 Nonrheumatic aortic valve disorder, unspecified: Secondary | ICD-10-CM | POA: Diagnosis not present

## 2023-09-26 DIAGNOSIS — Z952 Presence of prosthetic heart valve: Secondary | ICD-10-CM | POA: Diagnosis not present

## 2023-09-26 DIAGNOSIS — Z5181 Encounter for therapeutic drug level monitoring: Secondary | ICD-10-CM | POA: Diagnosis not present

## 2023-09-26 LAB — POCT INR: INR: 3 (ref 2.0–3.0)

## 2023-09-26 NOTE — Patient Instructions (Signed)
Description   Today take 1/2 tablet of warfarin then continue taking warfarin 1 tablet daily, EXCEPT 1.5 TABLETS ON WEDNESDAYS. Stay consistent with greens each week (2 per week)  Recheck in 5 weeks.  [facility] [phone] or [phone] Main Fax #[phone]

## 2023-09-30 ENCOUNTER — Ambulatory Visit: Payer: BC Managed Care – PPO

## 2023-10-31 ENCOUNTER — Ambulatory Visit: Payer: BC Managed Care – PPO | Attending: Cardiovascular Disease

## 2023-10-31 DIAGNOSIS — I359 Nonrheumatic aortic valve disorder, unspecified: Secondary | ICD-10-CM

## 2023-10-31 DIAGNOSIS — Z5181 Encounter for therapeutic drug level monitoring: Secondary | ICD-10-CM

## 2023-10-31 DIAGNOSIS — Z952 Presence of prosthetic heart valve: Secondary | ICD-10-CM | POA: Diagnosis not present

## 2023-10-31 LAB — POCT INR: INR: 3 (ref 2.0–3.0)

## 2023-10-31 NOTE — Patient Instructions (Signed)
 continue taking warfarin 1 tablet daily, EXCEPT 1.5 TABLETS ON WEDNESDAYS. Stay consistent with greens each week (2 per week)  Recheck in 6 weeks.  [facility] [phone] or [phone] Main Fax #[phone]

## 2023-11-18 ENCOUNTER — Ambulatory Visit (HOSPITAL_COMMUNITY): Payer: BC Managed Care – PPO | Attending: Cardiovascular Disease

## 2023-11-18 ENCOUNTER — Ambulatory Visit: Payer: BC Managed Care – PPO | Admitting: Cardiovascular Disease

## 2023-11-18 DIAGNOSIS — Z5181 Encounter for therapeutic drug level monitoring: Secondary | ICD-10-CM | POA: Insufficient documentation

## 2023-11-18 DIAGNOSIS — I493 Ventricular premature depolarization: Secondary | ICD-10-CM | POA: Diagnosis not present

## 2023-11-18 DIAGNOSIS — Z952 Presence of prosthetic heart valve: Secondary | ICD-10-CM | POA: Insufficient documentation

## 2023-11-18 DIAGNOSIS — I359 Nonrheumatic aortic valve disorder, unspecified: Secondary | ICD-10-CM | POA: Insufficient documentation

## 2023-11-18 LAB — ECHOCARDIOGRAM COMPLETE
AR max vel: 1.03 cm2
AV Area VTI: 0.95 cm2
AV Area mean vel: 0.98 cm2
AV Mean grad: 28 mmHg
AV Peak grad: 46.5 mmHg
Ao pk vel: 3.41 m/s
Area-P 1/2: 2.8 cm2
S' Lateral: 2.4 cm

## 2023-11-30 ENCOUNTER — Other Ambulatory Visit: Payer: Self-pay | Admitting: Cardiovascular Disease

## 2023-11-30 DIAGNOSIS — Z952 Presence of prosthetic heart valve: Secondary | ICD-10-CM

## 2023-12-08 ENCOUNTER — Ambulatory Visit: Payer: Self-pay | Attending: Cardiovascular Disease | Admitting: Cardiovascular Disease

## 2023-12-08 ENCOUNTER — Encounter: Payer: Self-pay | Admitting: Cardiovascular Disease

## 2023-12-08 VITALS — BP 116/70 | HR 70 | Ht 61.0 in | Wt 171.0 lb

## 2023-12-08 DIAGNOSIS — Z952 Presence of prosthetic heart valve: Secondary | ICD-10-CM

## 2023-12-08 DIAGNOSIS — E782 Mixed hyperlipidemia: Secondary | ICD-10-CM | POA: Diagnosis not present

## 2023-12-08 MED ORDER — AMOXICILLIN 500 MG PO CAPS: ORAL_CAPSULE | ORAL | 3 refills | Status: AC

## 2023-12-08 NOTE — Progress Notes (Signed)
 Cardiology Office Note:    Date:  12/08/2023   ID:  [name], DOB 1956/03/12, MRN [id number]  PCP:  Jearldine Mina, MD   [facility] Providers Cardiologist:  Arnoldo Lapping, MD     Referring MD: Jearldine Mina, MD   Chief Complaint  Patient presents with   Follow-up    S/P AVR    History of Present Illness:    [name] is a 68 y.o. 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 gradient Diabetes mellitus  GERD  Hyperlipidemia   The patient has here alone today.  She is doing very well.  She has retired from work and is really enjoying her retirement.  She is much more physically active and has no symptoms of chest pain, chest pressure, lightheadedness, or exertional dyspnea with physical activity.  She is lost over 20 pounds and reports feeling much better.   Current Medications: Current Meds  Medication Sig   aspirin  81 MG tablet Take 81 mg by mouth every morning.    Blood Glucose Monitoring Suppl (ONE TOUCH ULTRA 2) w/Device KIT    CRESTOR  20 MG tablet TAKE 1 TABLET BY MOUTH ONCE A DAY   dapagliflozin propanediol (FARXIGA) 10 MG TABS tablet Take 10 mg by mouth daily.   ketoconazole (NIZORAL) 2 % cream Apply topically as needed.   metoprolol  tartrate (LOPRESSOR ) 25 MG tablet TAKE 1 TABLET TWICE A DAY (BETA BLOCKER)   MOUNJARO 7.5 MG/0.5ML Pen Inject 7.5 mg into the skin once a week.   omega-3 acid ethyl esters (LOVAZA) 1 g capsule Take 1 g by mouth daily.   ONETOUCH ULTRA test strip    warfarin (COUMADIN ) 5 MG tablet TAKE AS DIRECTED BY COUMIDIN CLINIC   [DISCONTINUED] amoxicillin  (AMOXIL ) 500 MG capsule TAKE 4 CAPSULES BY MOUTH 1 HOUR PRIOR TO ALL DENTAL VISITS   [DISCONTINUED] Dulaglutide (TRULICITY) 0.75 MG/0.5ML SOPN Inject 0.75 mg into the skin.   [DISCONTINUED]  glimepiride (AMARYL) 1 MG tablet Take 1 mg by mouth 2 (two) times daily.     Allergies:   Atorvastatin, Codeine, Metformin  and related, Morphine  and codeine, Niacin, and Percocet [oxycodone-acetaminophen ]   ROS:   Please see the history of present illness.    All other systems reviewed and are negative.  EKGs/Labs/Other Studies Reviewed:    The following studies were reviewed today: Cardiac Studies & Procedures   ______________________________________________________________________________________________     ECHOCARDIOGRAM  ECHOCARDIOGRAM COMPLETE 11/18/2023  Narrative ECHOCARDIOGRAM REPORT    Patient Name:   [name] Date of Exam: 11/18/2023 Medical Rec #:  [id number]            Height:       62.0 in Accession #:    [id number]           Weight:       185.4 lb Date of Birth:  1956-04-21            BSA:          1.851 m Patient Age:    68 years             BP:           126/80 mmHg Patient Gender: F                    HR:  69 bpm. Exam Location:  [address]  Procedure: 2D Echo, Cardiac Doppler, Color Doppler and 3D Echo (Both Spectral and Color Flow Doppler were utilized during procedure).  Indications:    S/p AVR Z95.2  History:        Patient has prior history of Echocardiogram examinations, most recent 09/30/2022. Arrythmias:PVC; Risk Factors:Diabetes and Dyslipidemia. Aortic Valve: 19 mm mechanical valve is present in the aortic position. Procedure Date: 06/25/2011.  Sonographer:    Joleen Navy RDCS Referring Phys: [phone] Anarie Kalish  IMPRESSIONS   1. Left ventricular ejection fraction, by estimation, is 55 to 60%. The left ventricle has normal function. The left ventricle has no regional wall motion abnormalities. Left ventricular diastolic parameters are consistent with Grade I diastolic dysfunction (impaired relaxation). 2. Right ventricular systolic function is normal. The right ventricular size is normal. 3. The mitral valve is  abnormal. Trivial mitral valve regurgitation. No evidence of mitral stenosis. 4. Post AVR with 19 mm mechanical valve gradients lower than those measured 09/30/22 at that time mean 34 and 64 mmhg Significant component of patient prosthesis mismatch likely Crisp normal opening/closing valve clics with normal acceleration time Trivial AR closing volume not PVL. Aaron Aas The aortic valve has been repaired/replaced. Aortic valve regurgitation is not visualized. prosthetic mismatch. There is a 19 mm mechanical valve present in the aortic position. Procedure Date: 06/25/2011. 5. The inferior vena cava is normal in size with greater than 50% respiratory variability, suggesting right atrial pressure of 3 mmHg.  FINDINGS Left Ventricle: Left ventricular ejection fraction, by estimation, is 55 to 60%. The left ventricle has normal function. The left ventricle has no regional wall motion abnormalities. Strain was performed and the global longitudinal strain is indeterminate. The left ventricular internal cavity size was normal in size. There is no left ventricular hypertrophy. Left ventricular diastolic parameters are consistent with Grade I diastolic dysfunction (impaired relaxation).  Right Ventricle: The right ventricular size is normal. No increase in right ventricular wall thickness. Right ventricular systolic function is normal.  Left Atrium: Left atrial size was normal in size.  Right Atrium: Right atrial size was normal in size.  Pericardium: There is no evidence of pericardial effusion.  Mitral Valve: The mitral valve is abnormal. There is mild thickening of the mitral valve leaflet(s). Mild mitral annular calcification. Trivial mitral valve regurgitation. No evidence of mitral valve stenosis.  Tricuspid Valve: The tricuspid valve is normal in structure. Tricuspid valve regurgitation is trivial. No evidence of tricuspid stenosis.  Aortic Valve: Post AVR with 19 mm mechanical valve gradients lower than  those measured 09/30/22 at that time mean 34 and 64 mmhg Significant component of patient prosthesis mismatch likely Crisp normal opening/closing valve clics with normal acceleration time Trivial AR closing volume not PVL. The aortic valve has been repaired/replaced. Aortic valve regurgitation is not visualized. Prosthetic mismatch. Aortic valve mean gradient measures 28.0 mmHg. Aortic valve peak gradient measures 46.5 mmHg. Aortic valve area, by VTI measures 0.95 cm. There is a 19 mm mechanical valve present in the aortic position. Procedure Date: 06/25/2011.  Pulmonic Valve: The pulmonic valve was normal in structure. Pulmonic valve regurgitation is not visualized. No evidence of pulmonic stenosis.  Aorta: The aortic root is normal in size and structure.  Venous: The inferior vena cava is normal in size with greater than 50% respiratory variability, suggesting right atrial pressure of 3 mmHg.  IAS/Shunts: No atrial level shunt detected by color flow Doppler.  Additional Comments: 3D was performed not requiring image post  processing on an independent workstation and was normal.   LEFT VENTRICLE PLAX 2D LVIDd:         3.80 cm   Diastology LVIDs:         2.40 cm   LV e' medial:    6.53 cm/s LV PW:         1.00 cm   LV E/e' medial:  13.3 LV IVS:        1.10 cm   LV e' lateral:   7.29 cm/s LVOT diam:     1.92 cm   LV E/e' lateral: 11.9 LV SV:         77 LV SV Index:   42 LVOT Area:     2.89 cm  3D Volume EF: 3D EF:        57 % LV EDV:       75 ml LV ESV:       32 ml LV SV:        43 ml  RIGHT VENTRICLE             IVC RV S prime:     13.10 cm/s  IVC diam: 1.60 cm TAPSE (M-mode): 1.7 cm  LEFT ATRIUM             Index LA diam:        3.70 cm 2.00 cm/m LA Vol (A2C):   26.6 ml 14.37 ml/m LA Vol (A4C):   32.6 ml 17.61 ml/m LA Biplane Vol: 30.6 ml 16.53 ml/m AORTIC VALVE AV Area (Vmax):    1.03 cm AV Area (Vmean):   0.98 cm AV Area (VTI):     0.95 cm AV Vmax:            341.00 cm/s AV Vmean:          241.000 cm/s AV VTI:            0.818 m AV Peak Grad:      46.5 mmHg AV Mean Grad:      28.0 mmHg LVOT Vmax:         121.00 cm/s LVOT Vmean:        82.100 cm/s LVOT VTI:          0.268 m LVOT/AV VTI ratio: 0.33  AORTA Ao Root diam: 2.30 cm Ao Asc diam:  3.50 cm  MITRAL VALVE MV Area (PHT): 2.80 cm     SHUNTS MV Decel Time: 271 msec     Systemic VTI:  0.27 m MV E velocity: 86.80 cm/s   Systemic Diam: 1.92 cm MV A velocity: 130.00 cm/s MV E/A ratio:  0.67  Janelle Mediate MD Electronically signed by Janelle Mediate MD Signature Date/Time: 11/18/2023/1:51:39 PM    Final    MONITORS  LONG TERM MONITOR (3-14 DAYS) 06/12/2022  Narrative Patch Wear Time:  2 days and 20 hours (2023-10-27T09:03:47-0400 to 2023-10-30T05:11:12-398)  Patient had a min HR of 46 bpm, max HR of 162 bpm, and avg HR of 73 bpm. Predominant underlying rhythm was Sinus Rhythm. 1 run of Supraventricular Tachycardia occurred lasting 8 beats with a max rate of 162 bpm (avg 139 bpm). Isolated SVEs were rare (<1.0%), SVE Couplets were rare (<1.0%), and no SVE Triplets were present. Isolated VEs were rare (<1.0%, 305), VE Triplets were rare (<1.0%, 1), and no VE Couplets were present. Ventricular Bigeminy and Trigeminy were present.  SUMMARY: The basic rhythm is normal sinus with an average HR of 73 bpm. There is a single nonsustained supraventricular  run of 8 beats. No sustained arrhythmia. No afib or flutter.   CT SCANS  CT CORONARY MORPH W/CTA COR W/SCORE 01/22/2018  Addendum 01/22/2018  8:35 PM ADDENDUM REPORT: 01/22/2018 20:33  CLINICAL DATA:  68 year old female post AVR with a 19 mm mechanical valve in 2012 now with increasing trans-aortic gradients.  EXAM: Cardiac TAVR CT  TECHNIQUE: The patient was scanned on a [employer]. A 120 kV retrospective scan was triggered in the descending thoracic aorta at 111 HU's. Gantry rotation speed was 250 msecs and  collimation was .6 mm. No beta blockade or nitro were given. The 3D data set was reconstructed in 5% intervals of the R-R cycle. Systolic and diastolic phases were analyzed on a dedicated work station using MPR, MIP and VRT modes. The patient received 80 cc of contrast.  FINDINGS: Aortic Valve: A bileaflet 19 mm mechanical valve sits well in the aortic position. The leaflets have full range unrestricted motion and there is no obvious pannus or a thrombus seen on the leaflets. There is no LVOT obstruction in systole and no subvalvular membrane.  Aorta: Normal size, no calcifications, no dissection.  Sinotubular Junction: 24 x 23 mm  Ascending Thoracic Aorta: 31 x 30 mm  Aortic Arch: 22 x 20 mm  Descending Thoracic Aorta: 19 x 18 mm  IMPRESSION: 1. A bileaflet 19 mm mechanical valve sits well in the aortic position. The leaflets have full range of motion and there is no obvious pannus or a thrombus seen on the leaflets. There is no LVOT obstruction in systole and no subvalvular membrane.   Electronically Signed By: Christoper Crafts On: 01/22/2018 20:33  Narrative EXAM: OVER-READ INTERPRETATION  CT CHEST  The following report is an over-read performed by radiologist Dr. Alexandria Angel of [facility] on 01/22/2018. This over-read does not include interpretation of cardiac or coronary anatomy or pathology. The coronary CTA interpretation by the cardiologist is attached.  COMPARISON:  None.  FINDINGS: Within the visualized portions of the thorax there are no suspicious appearing pulmonary nodules or masses, there is no acute consolidative airspace disease, no pleural effusions, no pneumothorax and no lymphadenopathy. Visualized portions of the upper abdomen are unremarkable. There are no aggressive appearing lytic or blastic lesions noted in the visualized portions of the skeleton. Median sternotomy wires.  IMPRESSION: 1. No significant incidental  noncardiac findings are noted.  Electronically Signed: By: Alexandria Angel M.D. On: 01/22/2018 13:30     ______________________________________________________________________________________________      EKG:   EKG Interpretation Date/Time:  Monday December 08 2023 14:04:29 EDT Ventricular Rate:  70 PR Interval:  158 QRS Duration:  86 QT Interval:  392 QTC Calculation: 423 R Axis:   94  Text Interpretation: Normal sinus rhythm Rightward axis Cannot rule out Anterior infarct , age undetermined When compared with ECG of 19-Oct-2014 12:30, PREVIOUS ECG IS PRESENTthe axis has shifted rightward Confirmed by Arnoldo Lapping [phone]) on 12/08/2023 2:34:57 PM    Recent Labs: No results found for requested labs within last 365 days.  Recent Lipid Panel    Component Value Date/Time   CHOL 163 03/03/2013 0732   TRIG 238.0 (H) 03/03/2013 0732   HDL 46.20 03/03/2013 0732   CHOLHDL 4 03/03/2013 0732   VLDL 47.6 (H) 03/03/2013 0732   LDLCALC 91 03/18/2011 0743   LDLDIRECT 93.3 03/03/2013 0732     Risk Assessment/Calculations:                Physical Exam:  VS:  BP 116/70   Pulse 70   Ht 5\' 1"  (1.549 m)   Wt 171 lb (77.6 kg)   SpO2 97%   BMI 32.31 kg/m     Wt Readings from Last 3 Encounters:  12/08/23 171 lb (77.6 kg)  09/30/22 185 lb 6.4 oz (84.1 kg)  06/04/22 183 lb 3.2 oz (83.1 kg)     GEN:  Well nourished, well developed in no acute distress HEENT: Normal NECK: No JVD; No carotid bruits LYMPHATICS: No lymphadenopathy CARDIAC: RRR, 2/6 systolic murmur at the right upper sternal border with normal mechanical A2 RESPIRATORY:  Clear to auscultation without rales, wheezing or rhonchi  ABDOMEN: Soft, non-tender, non-distended MUSCULOSKELETAL:  No edema; No deformity  SKIN: Warm and dry NEUROLOGIC:  Alert and oriented x 3 PSYCHIATRIC:  Normal affect   Assessment & Plan S/P AVR (aortic valve replacement) The patient's most recent echocardiogram is reviewed.  Her  LVEF is 55 to 60% with grade 1 diastolic dysfunction.  RV function is normal.  Her mechanical valve function suggests less significant patient prosthesis mismatch with her weight loss.  Her mean gradient is decreased from 34 mmHg to 28 mmHg on the most recent study.  Valve function appears essentially normal otherwise with normal acceleration time and trivial AR.  She will continue on low-dose aspirin  and warfarin in absence of bleeding problems.  I will see her back next year with a repeat echocardiogram. Mixed hyperlipidemia Treated with rosuvastatin .  Last LDL cholesterol is 93.  No history of atherosclerotic disease.     Medication Adjustments/Labs and Tests Ordered: Current medicines are reviewed at length with the patient today.  Concerns regarding medicines are outlined above.  Orders Placed This Encounter  Procedures   EKG 12-Lead   ECHOCARDIOGRAM COMPLETE   Meds ordered this encounter  Medications   amoxicillin  (AMOXIL ) 500 MG capsule    Sig: TAKE 4 CAPSULES BY MOUTH 1 HOUR PRIOR TO ALL DENTAL VISITS    Dispense:  12 capsule    Refill:  3    Patient Instructions  Testing/Procedures: ECHO (in 1 year, prior to visit) Your physician has requested that you have an echocardiogram. Echocardiography is a painless test that uses sound waves to create images of your heart. It provides your doctor with information about the size and shape of your heart and how well your heart's chambers and valves are working. This procedure takes approximately one hour. There are no restrictions for this procedure. Please do NOT wear cologne, perfume, aftershave, or lotions (deodorant is allowed). Please arrive 15 minutes prior to your appointment time.  Please note: We ask at that you not bring children with you during ultrasound (echo/ vascular) testing. Due to room size and safety concerns, children are not allowed in the ultrasound rooms during exams. Our front office staff cannot provide observation  of children in our lobby area while testing is being conducted. An adult accompanying a patient to their appointment will only be allowed in the ultrasound room at the discretion of the ultrasound technician under special circumstances. We apologize for any inconvenience.  Follow-Up: At [facility], you and your health needs are our priority.  As part of our continuing mission to provide you with exceptional heart care, our providers are all part of one team.  This team includes your primary Cardiologist (physician) and Advanced Practice Providers or APPs (Physician Assistants and Nurse Practitioners) who all work together to provide you with the care you need, when you need  it.  Your next appointment:   1 year(s)  Provider:   Arnoldo Lapping, MD       Signed, Arnoldo Lapping, MD  12/08/2023 3:23 PM    [facility]

## 2023-12-08 NOTE — Patient Instructions (Signed)
 Testing/Procedures: ECHO (in 1 year, prior to visit) Your physician has requested that you have an echocardiogram. Echocardiography is a painless test that uses sound waves to create images of your heart. It provides your doctor with information about the size and shape of your heart and how well your heart's chambers and valves are working. This procedure takes approximately one hour. There are no restrictions for this procedure. Please do NOT wear cologne, perfume, aftershave, or lotions (deodorant is allowed). Please arrive 15 minutes prior to your appointment time.  Please note: We ask at that you not bring children with you during ultrasound (echo/ vascular) testing. Due to room size and safety concerns, children are not allowed in the ultrasound rooms during exams. Our front office staff cannot provide observation of children in our lobby area while testing is being conducted. An adult accompanying a patient to their appointment will only be allowed in the ultrasound room at the discretion of the ultrasound technician under special circumstances. We apologize for any inconvenience.  Follow-Up: At [facility], you and your health needs are our priority.  As part of our continuing mission to provide you with exceptional heart care, our providers are all part of one team.  This team includes your primary Cardiologist (physician) and Advanced Practice Providers or APPs (Physician Assistants and Nurse Practitioners) who all work together to provide you with the care you need, when you need it.  Your next appointment:   1 year(s)  Provider:   Arnoldo Lapping, MD

## 2023-12-08 NOTE — Assessment & Plan Note (Signed)
 The patient's most recent echocardiogram is reviewed.  Her LVEF is 55 to 60% with grade 1 diastolic dysfunction.  RV function is normal.  Her mechanical valve function suggests less significant patient prosthesis mismatch with her weight loss.  Her mean gradient is decreased from 34 mmHg to 28 mmHg on the most recent study.  Valve function appears essentially normal otherwise with normal acceleration time and trivial AR.  She will continue on low-dose aspirin  and warfarin in absence of bleeding problems.  I will see her back next year with a repeat echocardiogram.

## 2023-12-08 NOTE — Assessment & Plan Note (Signed)
 Treated with rosuvastatin .  Last LDL cholesterol is 93.  No history of atherosclerotic disease.

## 2023-12-12 ENCOUNTER — Ambulatory Visit: Attending: Cardiovascular Disease

## 2023-12-12 DIAGNOSIS — Z952 Presence of prosthetic heart valve: Secondary | ICD-10-CM | POA: Diagnosis not present

## 2023-12-12 DIAGNOSIS — I359 Nonrheumatic aortic valve disorder, unspecified: Secondary | ICD-10-CM

## 2023-12-12 DIAGNOSIS — Z5181 Encounter for therapeutic drug level monitoring: Secondary | ICD-10-CM | POA: Diagnosis not present

## 2023-12-12 LAB — POCT INR: INR: 3.2 — AB (ref 2.0–3.0)

## 2023-12-12 NOTE — Patient Instructions (Signed)
 continue taking warfarin 1 tablet daily, EXCEPT 1.5 TABLETS ON WEDNESDAYS. Stay consistent with greens each week (2 per week) Eat greens tonight Recheck in 6 weeks.  Coumadin  Clinic [phone] or [phone] Main Fax #[phone]

## 2023-12-24 DIAGNOSIS — E1122 Type 2 diabetes mellitus with diabetic chronic kidney disease: Secondary | ICD-10-CM | POA: Diagnosis not present

## 2023-12-24 DIAGNOSIS — Z952 Presence of prosthetic heart valve: Secondary | ICD-10-CM | POA: Diagnosis not present

## 2023-12-24 DIAGNOSIS — E559 Vitamin D deficiency, unspecified: Secondary | ICD-10-CM | POA: Diagnosis not present

## 2023-12-24 DIAGNOSIS — J439 Emphysema, unspecified: Secondary | ICD-10-CM | POA: Diagnosis not present

## 2023-12-24 DIAGNOSIS — E782 Mixed hyperlipidemia: Secondary | ICD-10-CM | POA: Diagnosis not present

## 2023-12-24 DIAGNOSIS — D1803 Hemangioma of intra-abdominal structures: Secondary | ICD-10-CM | POA: Diagnosis not present

## 2023-12-24 DIAGNOSIS — N1831 Chronic kidney disease, stage 3a: Secondary | ICD-10-CM | POA: Diagnosis not present

## 2024-01-23 ENCOUNTER — Ambulatory Visit: Attending: Cardiovascular Disease

## 2024-01-23 DIAGNOSIS — Z952 Presence of prosthetic heart valve: Secondary | ICD-10-CM | POA: Diagnosis not present

## 2024-01-23 DIAGNOSIS — I359 Nonrheumatic aortic valve disorder, unspecified: Secondary | ICD-10-CM

## 2024-01-23 DIAGNOSIS — Z5181 Encounter for therapeutic drug level monitoring: Secondary | ICD-10-CM | POA: Diagnosis not present

## 2024-01-23 LAB — POCT INR: INR: 3.7 — AB (ref 2.0–3.0)

## 2024-01-23 NOTE — Patient Instructions (Signed)
 Hold today only then continue taking warfarin 1 tablet daily, EXCEPT 1.5 TABLETS ON WEDNESDAYS. Stay consistent with greens each week (2 per week) Recheck in 4 weeks.  Coumadin  Clinic [phone] or [phone] Main Fax #[phone]

## 2024-02-20 ENCOUNTER — Ambulatory Visit: Attending: Cardiovascular Disease

## 2024-02-20 DIAGNOSIS — I359 Nonrheumatic aortic valve disorder, unspecified: Secondary | ICD-10-CM

## 2024-02-20 DIAGNOSIS — Z5181 Encounter for therapeutic drug level monitoring: Secondary | ICD-10-CM

## 2024-02-20 DIAGNOSIS — Z952 Presence of prosthetic heart valve: Secondary | ICD-10-CM

## 2024-02-20 LAB — POCT INR: INR: 2.6 (ref 2.0–3.0)

## 2024-02-20 NOTE — Patient Instructions (Signed)
 Description   Continue taking warfarin 1 tablet daily, EXCEPT 1.5 TABLETS ON WEDNESDAYS. Stay consistent with greens each week (2 per week) Recheck in 4 weeks.  Coumadin  Clinic [phone] or [phone] Main Fax #[phone]

## 2024-02-20 NOTE — Progress Notes (Signed)
Please see anticoagulation encounter.

## 2024-03-19 ENCOUNTER — Ambulatory Visit: Attending: Cardiovascular Disease

## 2024-03-19 DIAGNOSIS — I359 Nonrheumatic aortic valve disorder, unspecified: Secondary | ICD-10-CM

## 2024-03-19 DIAGNOSIS — Z952 Presence of prosthetic heart valve: Secondary | ICD-10-CM

## 2024-03-19 DIAGNOSIS — Z5181 Encounter for therapeutic drug level monitoring: Secondary | ICD-10-CM

## 2024-03-19 LAB — POCT INR: INR: 3.3 — AB (ref 2.0–3.0)

## 2024-03-19 NOTE — Progress Notes (Signed)
 INR 3.3  Please see anticoagulation encounter

## 2024-03-19 NOTE — Patient Instructions (Signed)
 Description   HOLD today's dose and then continue taking warfarin 1 tablet daily, EXCEPT 1.5 TABLETS ON WEDNESDAYS. Stay consistent with greens each week (2 per week) Recheck in 4 weeks.  Coumadin  Clinic [phone] Main Fax #[phone]

## 2024-03-29 ENCOUNTER — Ambulatory Visit: Admitting: Cardiovascular Disease

## 2024-04-01 DIAGNOSIS — M67911 Unspecified disorder of synovium and tendon, right shoulder: Secondary | ICD-10-CM | POA: Diagnosis not present

## 2024-04-01 DIAGNOSIS — M25511 Pain in right shoulder: Secondary | ICD-10-CM | POA: Diagnosis not present

## 2024-04-05 DIAGNOSIS — E119 Type 2 diabetes mellitus without complications: Secondary | ICD-10-CM | POA: Diagnosis not present

## 2024-04-05 DIAGNOSIS — D3132 Benign neoplasm of left choroid: Secondary | ICD-10-CM | POA: Diagnosis not present

## 2024-04-05 DIAGNOSIS — H2513 Age-related nuclear cataract, bilateral: Secondary | ICD-10-CM | POA: Diagnosis not present

## 2024-04-05 DIAGNOSIS — H3561 Retinal hemorrhage, right eye: Secondary | ICD-10-CM | POA: Diagnosis not present

## 2024-04-15 ENCOUNTER — Ambulatory Visit: Attending: Cardiovascular Disease | Admitting: *Deleted

## 2024-04-15 DIAGNOSIS — I359 Nonrheumatic aortic valve disorder, unspecified: Secondary | ICD-10-CM | POA: Diagnosis not present

## 2024-04-15 DIAGNOSIS — Z5181 Encounter for therapeutic drug level monitoring: Secondary | ICD-10-CM | POA: Diagnosis not present

## 2024-04-15 DIAGNOSIS — Z952 Presence of prosthetic heart valve: Secondary | ICD-10-CM | POA: Diagnosis not present

## 2024-04-15 LAB — POCT INR: INR: 3.3 — AB (ref 2.0–3.0)

## 2024-04-15 NOTE — Patient Instructions (Addendum)
 Description   INR-3.3; Do not take any warfarin today then START taking warfarin 1 tablet daily.  Stay consistent with greens each week (2 per week) Recheck in 3 weeks.  Coumadin  Clinic [phone] Main Fax #[phone]

## 2024-04-15 NOTE — Progress Notes (Signed)
 Description   INR-3.3; Do not take any warfarin today then START taking warfarin 1 tablet daily.  Stay consistent with greens each week (2 per week) Recheck in 3 weeks.  Coumadin  Clinic [phone] Main Fax #[phone]

## 2024-04-16 ENCOUNTER — Ambulatory Visit

## 2024-04-19 ENCOUNTER — Encounter: Payer: Self-pay | Admitting: Cardiovascular Disease

## 2024-04-20 DIAGNOSIS — R29898 Other symptoms and signs involving the musculoskeletal system: Secondary | ICD-10-CM | POA: Diagnosis not present

## 2024-04-20 DIAGNOSIS — M25611 Stiffness of right shoulder, not elsewhere classified: Secondary | ICD-10-CM | POA: Diagnosis not present

## 2024-04-20 DIAGNOSIS — M25511 Pain in right shoulder: Secondary | ICD-10-CM | POA: Diagnosis not present

## 2024-04-27 DIAGNOSIS — M75121 Complete rotator cuff tear or rupture of right shoulder, not specified as traumatic: Secondary | ICD-10-CM | POA: Diagnosis not present

## 2024-05-03 DIAGNOSIS — H2511 Age-related nuclear cataract, right eye: Secondary | ICD-10-CM | POA: Diagnosis not present

## 2024-05-03 DIAGNOSIS — H25043 Posterior subcapsular polar age-related cataract, bilateral: Secondary | ICD-10-CM | POA: Diagnosis not present

## 2024-05-03 DIAGNOSIS — D3132 Benign neoplasm of left choroid: Secondary | ICD-10-CM | POA: Diagnosis not present

## 2024-05-03 DIAGNOSIS — H2513 Age-related nuclear cataract, bilateral: Secondary | ICD-10-CM | POA: Diagnosis not present

## 2024-05-03 DIAGNOSIS — E119 Type 2 diabetes mellitus without complications: Secondary | ICD-10-CM | POA: Diagnosis not present

## 2024-05-05 ENCOUNTER — Other Ambulatory Visit: Payer: Self-pay | Admitting: Internal Medicine

## 2024-05-05 DIAGNOSIS — M25511 Pain in right shoulder: Secondary | ICD-10-CM | POA: Diagnosis not present

## 2024-05-05 DIAGNOSIS — Z1231 Encounter for screening mammogram for malignant neoplasm of breast: Secondary | ICD-10-CM

## 2024-05-06 ENCOUNTER — Ambulatory Visit: Attending: Cardiovascular Disease | Admitting: *Deleted

## 2024-05-06 DIAGNOSIS — I359 Nonrheumatic aortic valve disorder, unspecified: Secondary | ICD-10-CM | POA: Diagnosis not present

## 2024-05-06 DIAGNOSIS — Z5181 Encounter for therapeutic drug level monitoring: Secondary | ICD-10-CM

## 2024-05-06 DIAGNOSIS — Z952 Presence of prosthetic heart valve: Secondary | ICD-10-CM

## 2024-05-06 LAB — POCT INR: POC INR: 3.2

## 2024-05-06 NOTE — Progress Notes (Signed)
 Lab Results  Component Value Date   INR 3.2 05/06/2024   INR 3.3 (A) 04/15/2024   INR 3.3 (A) 03/19/2024    Description   INR-3.2; Do not take any warfarin today then START taking warfarin 1 tablet daily.  Stay consistent with greens each week (2 per week) Recheck in 3 weeks.  Coumadin  Clinic [phone] Main Fax #[phone]

## 2024-05-06 NOTE — Patient Instructions (Signed)
 Description   INR-3.2; Do not take any warfarin today then START taking warfarin 1 tablet daily.  Stay consistent with greens each week (2 per week) Recheck in 3 weeks.  Coumadin  Clinic [phone] Main Fax #[phone]

## 2024-05-10 DIAGNOSIS — Z86018 Personal history of other benign neoplasm: Secondary | ICD-10-CM | POA: Diagnosis not present

## 2024-05-10 DIAGNOSIS — D225 Melanocytic nevi of trunk: Secondary | ICD-10-CM | POA: Diagnosis not present

## 2024-05-10 DIAGNOSIS — M7501 Adhesive capsulitis of right shoulder: Secondary | ICD-10-CM | POA: Diagnosis not present

## 2024-05-10 DIAGNOSIS — D485 Neoplasm of uncertain behavior of skin: Secondary | ICD-10-CM | POA: Diagnosis not present

## 2024-05-10 DIAGNOSIS — D235 Other benign neoplasm of skin of trunk: Secondary | ICD-10-CM | POA: Diagnosis not present

## 2024-05-10 DIAGNOSIS — D2262 Melanocytic nevi of left upper limb, including shoulder: Secondary | ICD-10-CM | POA: Diagnosis not present

## 2024-05-10 DIAGNOSIS — L814 Other melanin hyperpigmentation: Secondary | ICD-10-CM | POA: Diagnosis not present

## 2024-05-10 DIAGNOSIS — L578 Other skin changes due to chronic exposure to nonionizing radiation: Secondary | ICD-10-CM | POA: Diagnosis not present

## 2024-05-10 DIAGNOSIS — L304 Erythema intertrigo: Secondary | ICD-10-CM | POA: Diagnosis not present

## 2024-05-10 DIAGNOSIS — M67911 Unspecified disorder of synovium and tendon, right shoulder: Secondary | ICD-10-CM | POA: Diagnosis not present

## 2024-05-12 DIAGNOSIS — M25511 Pain in right shoulder: Secondary | ICD-10-CM | POA: Diagnosis not present

## 2024-05-14 ENCOUNTER — Telehealth (HOSPITAL_BASED_OUTPATIENT_CLINIC_OR_DEPARTMENT_OTHER): Payer: Self-pay | Admitting: *Deleted

## 2024-05-14 NOTE — Telephone Encounter (Signed)
   Pre-operative Risk Assessment    Patient Name: [name]  DOB: [birth year] MRN: [id number]   Date of last office visit: 12/08/23 DR. COOPER Date of next office visit: NONE   Request for Surgical Clearance    Procedure:  CATARACT EXTRACTION W/INTRAOCULAR LENS IMPLANT  OR THE RIGHT EYE TO BE DONE ON 06/15/24; THE LEFT EYE TO FOLLOW ON 06/22/24  Date of Surgery:  Clearance 06/15/24 RIGHT EYE; THE LEFT EYE TO FOLLOW ON 06/22/24                                Surgeon: NOT LISTED  Surgeon's Group or Practice Name:  [facility] Phone number:  [phone] Fax number:  [phone]   Type of Clearance Requested:   - Medical ; PER FORM NO NEED TO HOLD ANY MEDICATIONS   Type of Anesthesia:  TOPICAL WITH IV MEDICATION   Additional requests/questions:    [name]   05/14/2024, 10:19 AM

## 2024-05-14 NOTE — Telephone Encounter (Signed)
   Patient Name: [name]  DOB: 11-26-1955 MRN: [id number]  Primary Cardiologist: Ozell Fell, MD  Chart reviewed as part of pre-operative protocol coverage. Cataract extractions are recognized in guidelines as low risk surgeries that do not typically require specific preoperative testing or holding of blood thinner therapy. Therefore, given past medical history and time since last visit, based on ACC/AHA guidelines, [name] would be at acceptable risk for the planned procedure on 06/15/2024 and 06/22/2024 without further cardiovascular testing.   I will route this recommendation to the requesting party via Epic fax function and remove from pre-op pool.  Please call with questions.  Lamarr Satterfield, NP 05/14/2024, 10:29 AM

## 2024-05-17 DIAGNOSIS — H25043 Posterior subcapsular polar age-related cataract, bilateral: Secondary | ICD-10-CM | POA: Diagnosis not present

## 2024-05-17 DIAGNOSIS — H2511 Age-related nuclear cataract, right eye: Secondary | ICD-10-CM | POA: Diagnosis not present

## 2024-05-19 DIAGNOSIS — M25511 Pain in right shoulder: Secondary | ICD-10-CM | POA: Diagnosis not present

## 2024-05-26 DIAGNOSIS — M25511 Pain in right shoulder: Secondary | ICD-10-CM | POA: Diagnosis not present

## 2024-05-27 ENCOUNTER — Ambulatory Visit: Attending: Cardiovascular Disease | Admitting: *Deleted

## 2024-05-27 DIAGNOSIS — Z5181 Encounter for therapeutic drug level monitoring: Secondary | ICD-10-CM | POA: Diagnosis not present

## 2024-05-27 DIAGNOSIS — I359 Nonrheumatic aortic valve disorder, unspecified: Secondary | ICD-10-CM

## 2024-05-27 DIAGNOSIS — Z952 Presence of prosthetic heart valve: Secondary | ICD-10-CM

## 2024-05-27 LAB — POCT INR: POC INR: 2.3

## 2024-05-27 NOTE — Progress Notes (Signed)
 Lab Results  Component Value Date   INR 2.3 05/27/2024   INR 3.2 05/06/2024   INR 3.3 (A) 04/15/2024    Description   INR- 2.3, Continue taking warfarin 1 tablet daily.  Stay consistent with greens each week (2 per week) Recheck in 4 weeks.  Coumadin  Clinic [phone] Main Fax #[phone]

## 2024-05-27 NOTE — Patient Instructions (Signed)
 Description   INR- 2.3, Continue taking warfarin 1 tablet daily.  Stay consistent with greens each week (2 per week) Recheck in 4 weeks.  Coumadin  Clinic [phone] Main Fax #[phone]

## 2024-05-31 DIAGNOSIS — H2513 Age-related nuclear cataract, bilateral: Secondary | ICD-10-CM | POA: Diagnosis not present

## 2024-05-31 DIAGNOSIS — H43811 Vitreous degeneration, right eye: Secondary | ICD-10-CM | POA: Diagnosis not present

## 2024-06-01 DIAGNOSIS — M25511 Pain in right shoulder: Secondary | ICD-10-CM | POA: Diagnosis not present

## 2024-06-09 DIAGNOSIS — H5319 Other subjective visual disturbances: Secondary | ICD-10-CM | POA: Diagnosis not present

## 2024-06-09 DIAGNOSIS — H43399 Other vitreous opacities, unspecified eye: Secondary | ICD-10-CM | POA: Diagnosis not present

## 2024-06-09 DIAGNOSIS — H43811 Vitreous degeneration, right eye: Secondary | ICD-10-CM | POA: Diagnosis not present

## 2024-06-11 DIAGNOSIS — H4311 Vitreous hemorrhage, right eye: Secondary | ICD-10-CM | POA: Diagnosis not present

## 2024-06-11 DIAGNOSIS — H43822 Vitreomacular adhesion, left eye: Secondary | ICD-10-CM | POA: Diagnosis not present

## 2024-06-11 DIAGNOSIS — H3561 Retinal hemorrhage, right eye: Secondary | ICD-10-CM | POA: Diagnosis not present

## 2024-06-11 DIAGNOSIS — H43811 Vitreous degeneration, right eye: Secondary | ICD-10-CM | POA: Diagnosis not present

## 2024-06-11 DIAGNOSIS — H43391 Other vitreous opacities, right eye: Secondary | ICD-10-CM | POA: Diagnosis not present

## 2024-06-11 DIAGNOSIS — H2513 Age-related nuclear cataract, bilateral: Secondary | ICD-10-CM | POA: Diagnosis not present

## 2024-06-14 ENCOUNTER — Ambulatory Visit
Admission: RE | Admit: 2024-06-14 | Discharge: 2024-06-14 | Disposition: A | Source: Ambulatory Visit | Attending: Internal Medicine | Admitting: Internal Medicine

## 2024-06-14 ENCOUNTER — Other Ambulatory Visit (HOSPITAL_BASED_OUTPATIENT_CLINIC_OR_DEPARTMENT_OTHER): Payer: Self-pay | Admitting: Internal Medicine

## 2024-06-14 DIAGNOSIS — Z0000 Encounter for general adult medical examination without abnormal findings: Secondary | ICD-10-CM | POA: Diagnosis not present

## 2024-06-14 DIAGNOSIS — E1122 Type 2 diabetes mellitus with diabetic chronic kidney disease: Secondary | ICD-10-CM | POA: Diagnosis not present

## 2024-06-14 DIAGNOSIS — Z1231 Encounter for screening mammogram for malignant neoplasm of breast: Secondary | ICD-10-CM | POA: Diagnosis not present

## 2024-06-14 DIAGNOSIS — E041 Nontoxic single thyroid nodule: Secondary | ICD-10-CM | POA: Diagnosis not present

## 2024-06-14 DIAGNOSIS — E2839 Other primary ovarian failure: Secondary | ICD-10-CM | POA: Diagnosis not present

## 2024-06-14 DIAGNOSIS — I700 Atherosclerosis of aorta: Secondary | ICD-10-CM | POA: Diagnosis not present

## 2024-06-14 DIAGNOSIS — J439 Emphysema, unspecified: Secondary | ICD-10-CM | POA: Diagnosis not present

## 2024-06-14 DIAGNOSIS — Z1389 Encounter for screening for other disorder: Secondary | ICD-10-CM | POA: Diagnosis not present

## 2024-06-14 DIAGNOSIS — E559 Vitamin D deficiency, unspecified: Secondary | ICD-10-CM | POA: Diagnosis not present

## 2024-06-14 DIAGNOSIS — Z952 Presence of prosthetic heart valve: Secondary | ICD-10-CM | POA: Diagnosis not present

## 2024-06-14 DIAGNOSIS — E782 Mixed hyperlipidemia: Secondary | ICD-10-CM | POA: Diagnosis not present

## 2024-06-14 DIAGNOSIS — N1831 Chronic kidney disease, stage 3a: Secondary | ICD-10-CM | POA: Diagnosis not present

## 2024-06-14 DIAGNOSIS — D1803 Hemangioma of intra-abdominal structures: Secondary | ICD-10-CM | POA: Diagnosis not present

## 2024-06-15 DIAGNOSIS — H25042 Posterior subcapsular polar age-related cataract, left eye: Secondary | ICD-10-CM | POA: Diagnosis not present

## 2024-06-15 DIAGNOSIS — H2511 Age-related nuclear cataract, right eye: Secondary | ICD-10-CM | POA: Diagnosis not present

## 2024-06-15 DIAGNOSIS — H2512 Age-related nuclear cataract, left eye: Secondary | ICD-10-CM | POA: Diagnosis not present

## 2024-06-21 DIAGNOSIS — M7501 Adhesive capsulitis of right shoulder: Secondary | ICD-10-CM | POA: Diagnosis not present

## 2024-06-21 DIAGNOSIS — M67911 Unspecified disorder of synovium and tendon, right shoulder: Secondary | ICD-10-CM | POA: Diagnosis not present

## 2024-06-22 DIAGNOSIS — H2512 Age-related nuclear cataract, left eye: Secondary | ICD-10-CM | POA: Diagnosis not present

## 2024-06-24 ENCOUNTER — Ambulatory Visit: Attending: Cardiovascular Disease | Admitting: *Deleted

## 2024-06-24 DIAGNOSIS — Z952 Presence of prosthetic heart valve: Secondary | ICD-10-CM

## 2024-06-24 DIAGNOSIS — Z5181 Encounter for therapeutic drug level monitoring: Secondary | ICD-10-CM | POA: Diagnosis not present

## 2024-06-24 DIAGNOSIS — I359 Nonrheumatic aortic valve disorder, unspecified: Secondary | ICD-10-CM | POA: Diagnosis not present

## 2024-06-24 DIAGNOSIS — Z23 Encounter for immunization: Secondary | ICD-10-CM | POA: Diagnosis not present

## 2024-06-24 LAB — POCT INR: POC INR: 2.5

## 2024-06-24 NOTE — Progress Notes (Signed)
 Lab Results  Component Value Date   INR 2.5 06/24/2024   INR 2.3 05/27/2024   INR 3.2 05/06/2024    Description   INR- 2.5, Continue taking warfarin 1 tablet daily.  Stay consistent with greens each week (2 per week) Recheck in 5 weeks.  Coumadin  Clinic [phone] Main Fax #[phone]

## 2024-06-24 NOTE — Patient Instructions (Signed)
 Description   INR- 2.5, Continue taking warfarin 1 tablet daily.  Stay consistent with greens each week (2 per week) Recheck in 5 weeks.  [facility]  [facility] [phone] Main Fax #[phone]

## 2024-07-28 ENCOUNTER — Ambulatory Visit: Attending: Cardiovascular Disease

## 2024-07-28 DIAGNOSIS — Z952 Presence of prosthetic heart valve: Secondary | ICD-10-CM | POA: Diagnosis not present

## 2024-07-28 DIAGNOSIS — Z5181 Encounter for therapeutic drug level monitoring: Secondary | ICD-10-CM

## 2024-07-28 DIAGNOSIS — I359 Nonrheumatic aortic valve disorder, unspecified: Secondary | ICD-10-CM

## 2024-07-28 LAB — POCT INR: INR: 3 (ref 2.0–3.0)

## 2024-07-28 NOTE — Patient Instructions (Signed)
 Description   INR-3.0, Continue taking warfarin 1 tablet daily.  Have something green leafy today and stay consistent with greens each week (2 per week) Recheck in 6 weeks.  [facility]  [facility] [phone] Main Fax #[phone]

## 2024-07-28 NOTE — Progress Notes (Signed)
 Description   INR-3.0, Continue taking warfarin 1 tablet daily.  Have something green leafy today and stay consistent with greens each week (2 per week) Recheck in 6 weeks.  [facility]  [facility] [phone] Main Fax #[phone]

## 2024-09-08 ENCOUNTER — Ambulatory Visit

## 2024-09-08 DIAGNOSIS — Z952 Presence of prosthetic heart valve: Secondary | ICD-10-CM | POA: Diagnosis not present

## 2024-09-08 DIAGNOSIS — I359 Nonrheumatic aortic valve disorder, unspecified: Secondary | ICD-10-CM

## 2024-09-08 DIAGNOSIS — Z5181 Encounter for therapeutic drug level monitoring: Secondary | ICD-10-CM

## 2024-09-08 LAB — POCT INR: INR: 2.1 (ref 2.0–3.0)

## 2024-09-08 NOTE — Patient Instructions (Signed)
 Description   INR-2.1, Continue taking warfarin 1 tablet daily.  Have something green leafy today and stay consistent with greens each week (2 per week) Recheck in 6 weeks.  [facility]  [facility] [phone] Main Fax #[phone]

## 2024-09-08 NOTE — Progress Notes (Signed)
 Description   INR-2.1, Continue taking warfarin 1 tablet daily.  Have something green leafy today and stay consistent with greens each week (2 per week) Recheck in 6 weeks.  [facility]  [facility] [phone] Main Fax #[phone]

## 2024-10-20 ENCOUNTER — Ambulatory Visit

## 2024-11-09 ENCOUNTER — Ambulatory Visit (HOSPITAL_BASED_OUTPATIENT_CLINIC_OR_DEPARTMENT_OTHER)
